# Patient Record
Sex: Female | Born: 1970
Health system: Southern US, Community
[De-identification: ages and names within clinical notes are randomized; demographics above are authoritative.]

## PROBLEM LIST (undated history)

## (undated) DIAGNOSIS — I10 Essential (primary) hypertension: Secondary | ICD-10-CM

## (undated) DIAGNOSIS — E119 Type 2 diabetes mellitus without complications: Secondary | ICD-10-CM

## (undated) HISTORY — PX: VOCAL CORD LATERALIZATION, ENDOSCOPIC APPROACH W/ MLB: SHX2664

## (undated) HISTORY — PX: TUBAL LIGATION: SHX77

---

## 1997-07-24 ENCOUNTER — Other Ambulatory Visit: Admission: RE | Admit: 1997-07-24 | Discharge: 1997-07-24 | Payer: Self-pay | Admitting: Obstetrics

## 1997-09-29 ENCOUNTER — Emergency Department (HOSPITAL_COMMUNITY): Admission: EM | Admit: 1997-09-29 | Discharge: 1997-09-29 | Payer: Self-pay | Admitting: Emergency Medicine

## 2000-09-26 ENCOUNTER — Encounter: Payer: Self-pay | Admitting: Emergency Medicine

## 2000-09-26 ENCOUNTER — Emergency Department (HOSPITAL_COMMUNITY): Admission: EM | Admit: 2000-09-26 | Discharge: 2000-09-26 | Payer: Self-pay

## 2005-02-23 ENCOUNTER — Emergency Department (HOSPITAL_COMMUNITY): Admission: EM | Admit: 2005-02-23 | Discharge: 2005-02-23 | Payer: Self-pay | Admitting: Family Medicine

## 2005-06-11 ENCOUNTER — Emergency Department (HOSPITAL_COMMUNITY): Admission: EM | Admit: 2005-06-11 | Discharge: 2005-06-11 | Payer: Self-pay | Admitting: Emergency Medicine

## 2005-06-11 IMAGING — DX DG ORTHOPANTOGRAM /PANORAMIC
1 series · 1 of 1 positions shown · non-contrast
Comparison: none

CLINICAL DATA: 35 year-old female with left upper posterior mandible pain and swelling.
 ORTHOPANTOGRAM:

[view not recorded]
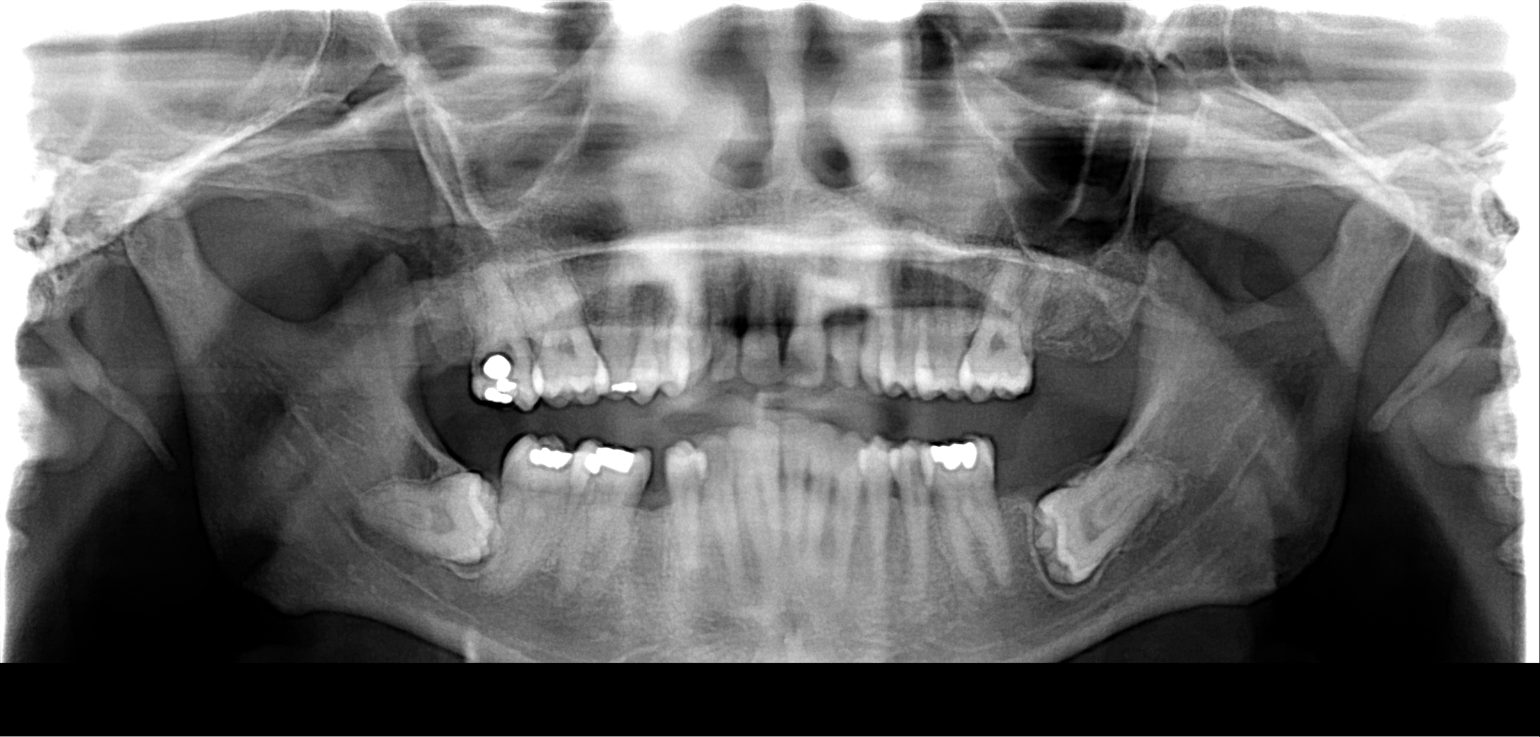

[1 of 1 positions shown; findings below may reference images not displayed]

FINDINGS: Intact mandible.  No displaced fracture.  Partially erupted residual wisdom teeth.  Dental hardware is evident.
IMPRESSION: 1. Intact mandible.
 2. Residual embedded wisdom teeth.

## 2009-10-10 ENCOUNTER — Emergency Department (HOSPITAL_COMMUNITY): Admission: EM | Admit: 2009-10-10 | Discharge: 2009-10-10 | Payer: Self-pay | Admitting: Family Medicine

## 2010-05-31 LAB — POCT URINALYSIS DIP (DEVICE)
Bilirubin Urine: NEGATIVE
Glucose, UA: >=1000 mg/dL — AB
Hgb urine dipstick: NEGATIVE
Ketones, ur: NEGATIVE mg/dL
Nitrite: POSITIVE — AB
Protein, ur: NEGATIVE mg/dL
Specific Gravity, Urine: 1.015 (ref 1.005–1.030)
Urobilinogen, UA: 0.2 mg/dL (ref 0.0–1.0)
pH: 5.5 (ref 5.0–8.0)

## 2010-05-31 LAB — URINE CULTURE
Colony Count: >=100000
Culture  Setup Time: 201107282029

## 2010-05-31 LAB — WET PREP, GENITAL
Clue Cells Wet Prep HPF POC: NONE SEEN
Yeast Wet Prep HPF POC: NONE SEEN

## 2010-05-31 LAB — GC/CHLAMYDIA PROBE AMP, GENITAL
Chlamydia, DNA Probe: NEGATIVE
GC Probe Amp, Genital: NEGATIVE

## 2013-10-02 ENCOUNTER — Encounter (HOSPITAL_BASED_OUTPATIENT_CLINIC_OR_DEPARTMENT_OTHER): Payer: Self-pay | Admitting: Emergency Medicine

## 2013-10-02 ENCOUNTER — Emergency Department (HOSPITAL_BASED_OUTPATIENT_CLINIC_OR_DEPARTMENT_OTHER)
Admission: EM | Admit: 2013-10-02 | Discharge: 2013-10-02 | Disposition: A | Discharge status: Home / Self Care | Payer: Worker's Compensation | Attending: Emergency Medicine | Admitting: Emergency Medicine

## 2013-10-02 DIAGNOSIS — Y9389 Activity, other specified: Secondary | ICD-10-CM | POA: Insufficient documentation

## 2013-10-02 DIAGNOSIS — Z792 Long term (current) use of antibiotics: Secondary | ICD-10-CM | POA: Insufficient documentation

## 2013-10-02 DIAGNOSIS — S60469A Insect bite (nonvenomous) of unspecified finger, initial encounter: Secondary | ICD-10-CM | POA: Insufficient documentation

## 2013-10-02 DIAGNOSIS — F17200 Nicotine dependence, unspecified, uncomplicated: Secondary | ICD-10-CM | POA: Insufficient documentation

## 2013-10-02 DIAGNOSIS — W57XXXA Bitten or stung by nonvenomous insect and other nonvenomous arthropods, initial encounter: Secondary | ICD-10-CM | POA: Insufficient documentation

## 2013-10-02 DIAGNOSIS — Y929 Unspecified place or not applicable: Secondary | ICD-10-CM | POA: Insufficient documentation

## 2013-10-02 MED ORDER — DOXYCYCLINE HYCLATE 100 MG PO CAPS: 100.0000 mg | ORAL_CAPSULE | Freq: Two times a day (BID) | ORAL | Status: DC

## 2013-10-02 NOTE — ED Notes (Signed)
Possible insect bite to her right 4th digit. Swelling, hot and painful to touch.

## 2013-10-02 NOTE — ED Provider Notes (Signed)
CSN: [id number]     Arrival date & time 10/02/13  1706 History  This chart was scribed for Threasa Beards, MD by Vernell Barrier, ED scribe. This patient was seen in room MH06/MH06 and the patient's care was started at 5:23 PM.     Chief Complaint  Patient presents with  . Insect Bite    Patient is a 43 y.o. female presenting with animal bite. The history is provided by the patient. No language interpreter was used.  Animal Bite Contact animal:  Insect Location:  Finger Finger injury location:  R ring finger Time since incident:  1 day Pain details:    Quality:  Localized   Severity:  Mild   Timing:  Constant   Progression:  Worsening Incident location:  Unable to specify Provoked: unprovoked   Notifications:  None Relieved by:  Nothing Worsened by:  Nothing tried Ineffective treatments:  None tried Associated symptoms: swelling   Associated symptoms: no fever    HPI Comments: Hannah Dougherty is a 43 y.o. female who presents to the Emergency Department complaining of localized pain and swelling to the right 4th finger; onset 1 day ago. States she woke up this morning and noticed it when her right pinky hit the area. Does not report seeing an insect bite her finger but believes that is the cause for current sxs.  History reviewed. No pertinent past medical history. History reviewed. No pertinent past surgical history. No family history on file. History  Substance Use Topics  . Smoking status: Current Every Day Smoker -- 0.50 packs/day    Types: Cigarettes  . Smokeless tobacco: Not on file  . Alcohol Use: Yes   OB History   Grav Para Term Preterm Abortions TAB SAB Ect Mult Living                 Review of Systems  Constitutional: Negative for fever.  All other systems reviewed and are negative.  Allergies  Review of patient's allergies indicates no known allergies.  Home Medications   Prior to Admission medications   Medication Sig Start Date End Date Taking?  Authorizing Provider  doxycycline (VIBRAMYCIN) 100 MG capsule Take 1 capsule (100 mg total) by mouth 2 (two) times daily. 10/02/13   Threasa Beards, MD   BP 175/104  Pulse 62  Temp(Src) 98.7 F (37.1 C) (Oral)  Resp 18  Ht 5\' 5"  (1.651 m)  Wt 217 lb (98.431 kg)  BMI 36.11 kg/m2  SpO2 100%  LMP 09/13/2013  Physical Exam  Nursing note and vitals reviewed. Constitutional: She is oriented to person, place, and time. She appears well-developed and well-nourished. No distress.  HENT:  Head: Normocephalic and atraumatic.  Eyes: Conjunctivae and EOM are normal.  Neck: Neck supple. No tracheal deviation present.  Cardiovascular: Normal rate.   Pulmonary/Chest: Effort normal. No respiratory distress.  Musculoskeletal: Normal range of motion.  Neurological: She is alert and oriented to person, place, and time.  Skin: Skin is warm and dry.  Erythema at medial right ring finger between the PIP and DIP joints with some swelling. No fluctuance. No streaks of erythema. Normal capillary refill distally. Sensation intact.   Psychiatric: She has a normal mood and affect. Her behavior is normal.    ED Course  Procedures (including critical care time) DIAGNOSTIC STUDIES: Oxygen Saturation is 100% on room air, normal by my interpretation.    COORDINATION OF CARE: At 5:24 PM: Discussed treatment plan with patient which includes prescription for antibiotics. Encouraged  to use an ice pack for swelling and use of hydrocortisone cream. Patient agrees.    Labs Review Labs Reviewed - No data to display  Imaging Review No results found.   EKG Interpretation None      MDM   Final diagnoses:  Insect bite   Pt presenting with area of pain and swelling on right 4th finger.  Most likely c/w insect bite.  Will cover with doxycycline to cover a mild developing cellulitis.  Pt also advised to use topical hydrocortisone cream for itching and swelling.  Discharged with strict return precautions.  Pt  agreeable with plan.   I personally performed the services described in this documentation, which was scribed in my presence. The recorded information has been reviewed and is accurate.     Threasa Beards, MD 10/02/13 [phone]

## 2013-10-02 NOTE — Discharge Instructions (Signed)
Return to the ED with any concerns including fever/chills, increased area of redness or swelling, redness streaking up the finger or hand, or any other alarming symptoms

## 2013-10-02 NOTE — ED Notes (Signed)
Pt states her BP is always high when she goes to the MD.  Pt also relates she has high reading when she gets it done at pharmacy.  Urged pt to follow up with her primary physician and get treatment if needed.  Pt related verbal understanding.

## 2014-11-30 ENCOUNTER — Emergency Department (HOSPITAL_COMMUNITY): Payer: Self-pay

## 2014-11-30 ENCOUNTER — Encounter (HOSPITAL_COMMUNITY): Payer: Self-pay | Admitting: Emergency Medicine

## 2014-11-30 ENCOUNTER — Emergency Department (HOSPITAL_COMMUNITY)
Admission: EM | Admit: 2014-11-30 | Discharge: 2014-11-30 | Disposition: A | Discharge status: Home / Self Care | Payer: Self-pay | Attending: Emergency Medicine | Admitting: Emergency Medicine

## 2014-11-30 DIAGNOSIS — R0602 Shortness of breath: Secondary | ICD-10-CM | POA: Insufficient documentation

## 2014-11-30 DIAGNOSIS — R05 Cough: Secondary | ICD-10-CM | POA: Insufficient documentation

## 2014-11-30 DIAGNOSIS — Z79899 Other long term (current) drug therapy: Secondary | ICD-10-CM | POA: Insufficient documentation

## 2014-11-30 DIAGNOSIS — Z720 Tobacco use: Secondary | ICD-10-CM | POA: Insufficient documentation

## 2014-11-30 LAB — CBC WITH DIFFERENTIAL/PLATELET
Basophils Absolute: 0 10*3/uL (ref 0.0–0.1)
Basophils Relative: 0 %
Eosinophils Absolute: 0.2 10*3/uL (ref 0.0–0.7)
Eosinophils Relative: 3 %
HCT: 37.9 % (ref 36.0–46.0)
Hemoglobin: 12.5 g/dL (ref 12.0–15.0)
Lymphocytes Relative: 36 %
Lymphs Abs: 2.6 10*3/uL (ref 0.7–4.0)
MCH: 28 pg (ref 26.0–34.0)
MCHC: 33 g/dL (ref 30.0–36.0)
MCV: 84.8 fL (ref 78.0–100.0)
Monocytes Absolute: 0.5 10*3/uL (ref 0.1–1.0)
Monocytes Relative: 7 %
Neutro Abs: 3.9 10*3/uL (ref 1.7–7.7)
Neutrophils Relative %: 54 %
Platelets: 201 10*3/uL (ref 150–400)
RBC: 4.47 MIL/uL (ref 3.87–5.11)
RDW: 14.2 % (ref 11.5–15.5)
WBC: 7.3 10*3/uL (ref 4.0–10.5)

## 2014-11-30 LAB — I-STAT CHEM 8, ED
BUN: 18 mg/dL (ref 6–20)
Calcium, Ion: 1.18 mmol/L (ref 1.12–1.23)
Chloride: 104 mmol/L (ref 101–111)
Creatinine, Ser: 1 mg/dL (ref 0.44–1.00)
Glucose, Bld: 148 mg/dL — ABNORMAL HIGH (ref 65–99)
HCT: 42 % (ref 36.0–46.0)
Hemoglobin: 14.3 g/dL (ref 12.0–15.0)
Potassium: 4.3 mmol/L (ref 3.5–5.1)
Sodium: 139 mmol/L (ref 135–145)
TCO2: 23 mmol/L (ref 0–100)

## 2014-11-30 LAB — BRAIN NATRIURETIC PEPTIDE: B Natriuretic Peptide: 36.2 pg/mL (ref 0.0–100.0)

## 2014-11-30 LAB — I-STAT TROPONIN, ED
Troponin i, poc: 0.01 ng/mL (ref 0.00–0.08)
Troponin i, poc: 0 ng/mL (ref 0.00–0.08)

## 2014-11-30 LAB — D-DIMER, QUANTITATIVE (NOT AT ARMC): D-Dimer, Quant: 0.28 ug/mL-FEU (ref 0.00–0.48)

## 2014-11-30 IMAGING — DX DG CHEST 2V
2 series · 2 of 2 positions shown · non-contrast
Comparison: None.

CLINICAL DATA: Acute chest pain and shortness of breath.

EXAM:
CHEST  2 VIEW

[chest pa]
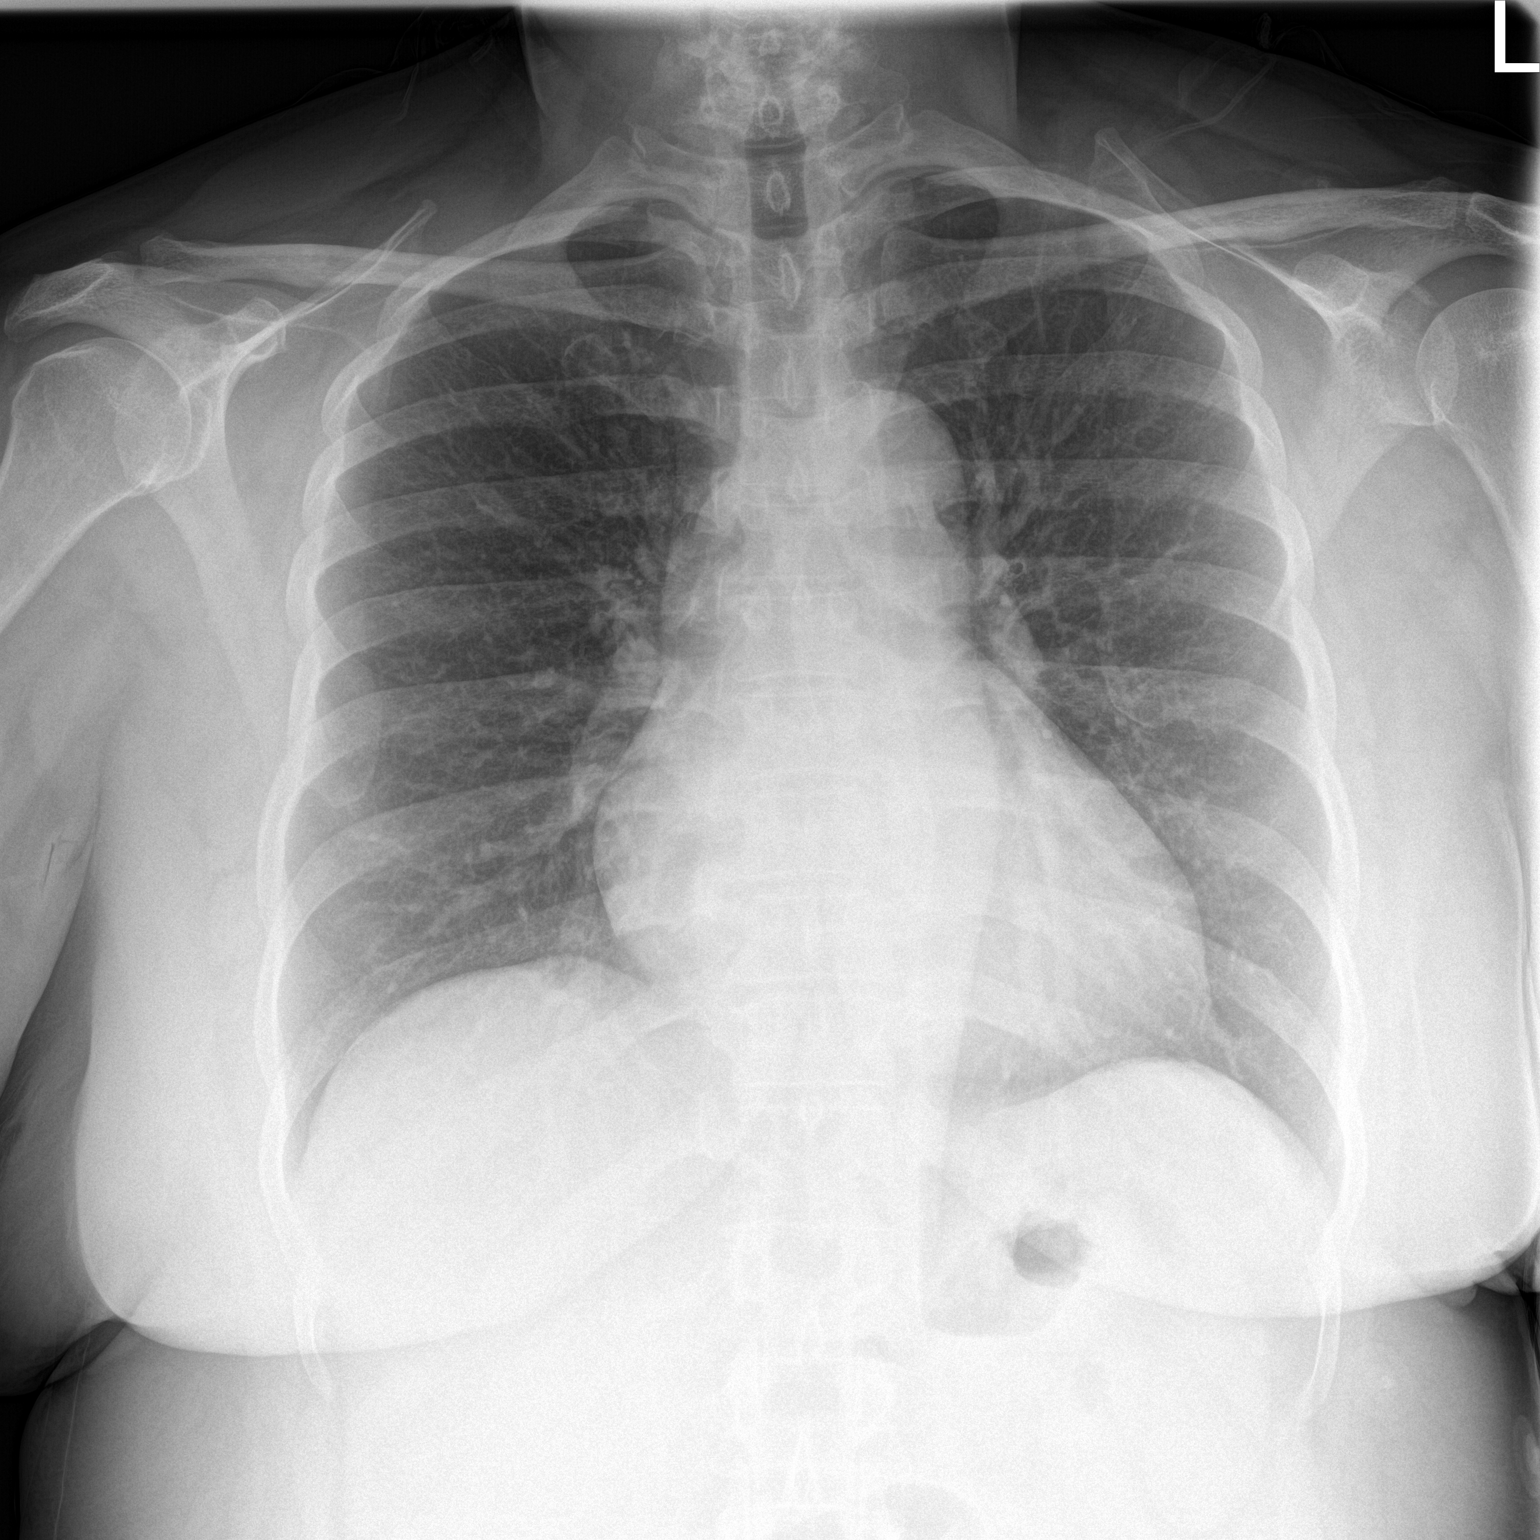

[chest lat]
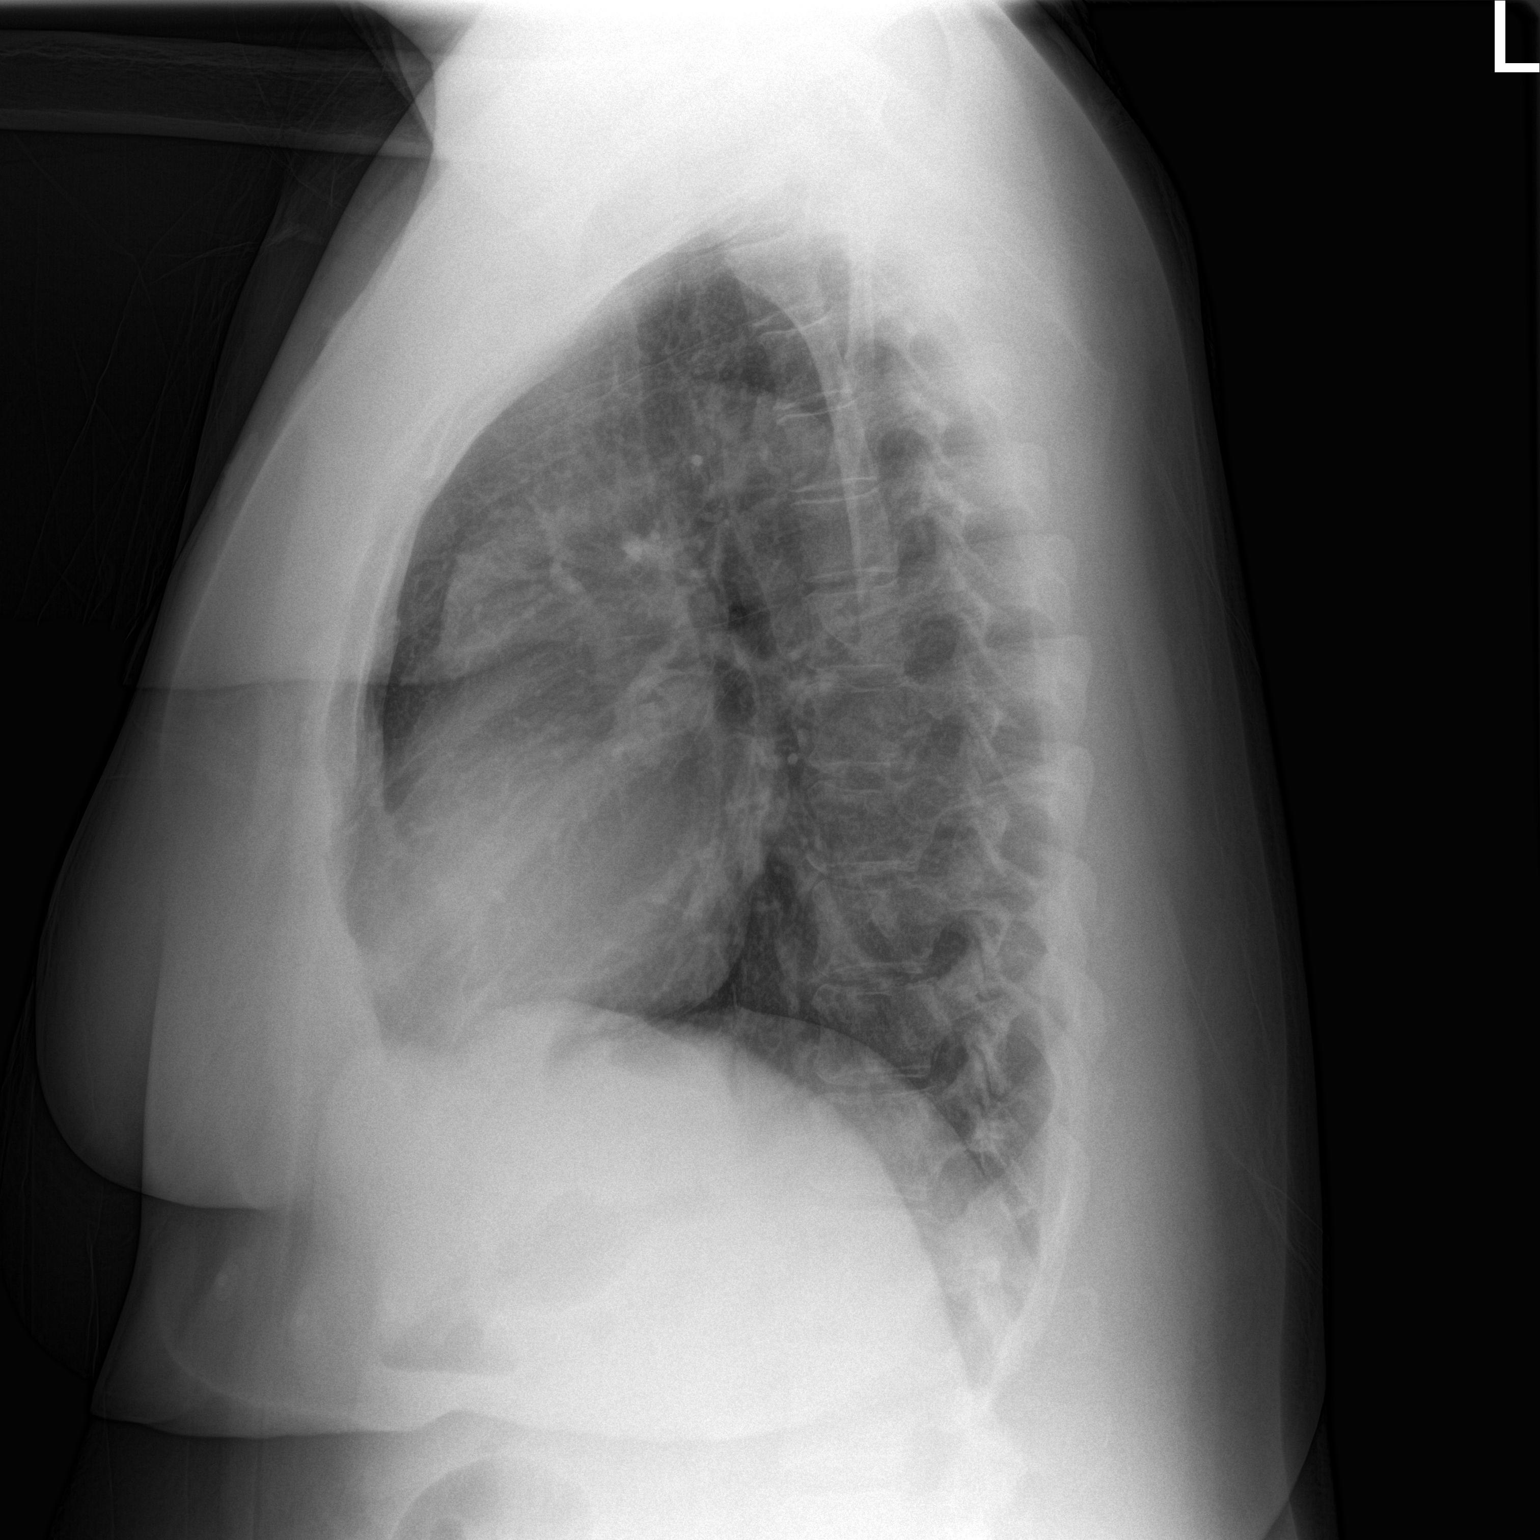

[2 of 2 positions shown; findings below may reference images not displayed]

FINDINGS: The heart size and mediastinal contours are within normal limits.
Both lungs are clear. No pneumothorax or pleural effusion is noted.
The visualized skeletal structures are unremarkable.
IMPRESSION: No active cardiopulmonary disease.

## 2014-11-30 MED ORDER — ALBUTEROL SULFATE HFA 108 (90 BASE) MCG/ACT IN AERS: 2.0000 | INHALATION_SPRAY | Freq: Four times a day (QID) | RESPIRATORY_TRACT | Status: DC | PRN

## 2014-11-30 MED ORDER — HYDROCODONE-ACETAMINOPHEN 5-325 MG PO TABS
1.0000 | ORAL_TABLET | Freq: Once | ORAL | Status: AC
  Administered 2014-11-30: 1 via ORAL
  Filled 2014-11-30: qty 1

## 2014-11-30 MED ORDER — LISINOPRIL 10 MG PO TABS
5.0000 mg | ORAL_TABLET | Freq: Once | ORAL | Status: AC
  Administered 2014-11-30: 5 mg via ORAL
  Filled 2014-11-30: qty 1

## 2014-11-30 MED ORDER — ALBUTEROL SULFATE (2.5 MG/3ML) 0.083% IN NEBU
5.0000 mg | INHALATION_SOLUTION | Freq: Once | RESPIRATORY_TRACT | Status: AC
  Administered 2014-11-30: 5 mg via RESPIRATORY_TRACT
  Filled 2014-11-30: qty 6

## 2014-11-30 MED ORDER — CYCLOBENZAPRINE HCL 5 MG PO TABS: 5.0000 mg | ORAL_TABLET | Freq: Three times a day (TID) | ORAL | Status: DC | PRN

## 2014-11-30 NOTE — ED Notes (Signed)
Patient transported to X-ray 

## 2014-11-30 NOTE — ED Notes (Signed)
Pt walked to bathroom and back to room in nad, pt oxygen saturation maintained at 100%. Denies any dizziness or other complaints. MD notified and informed.

## 2014-11-30 NOTE — Discharge Instructions (Signed)
Workup done in the ED was unremarkable for any emergent causes of your chest pain or shortness of breath Since you had some relief with albuterol I will send you home with albuterol prescription to use as needed for shortness of breath You're encouraged to stop smoking to help with her shortness of breath It is important that you follow-up with a primary care doctor who can manage your illnesses. Her blood pressure was elevated in the ED. You need to be started on antihypertensive medications See below resource guide for clinics. I would recommend trying the [place] first for primary care Please see below for return precautions.   Shortness of Breath Shortness of breath means you have trouble breathing. Shortness of breath needs medical care right away. HOME CARE   Do not smoke.  Avoid being around chemicals or things (paint fumes, dust) that may bother your breathing.  Rest as needed. Slowly begin your normal activities.  Only take medicines as told by your doctor.  Keep all doctor visits as told. GET HELP RIGHT AWAY IF:   Your shortness of breath gets worse.  You feel lightheaded, pass out (faint), or have a cough that is not helped by medicine.  You cough up blood.  You have pain with breathing.  You have pain in your chest, arms, shoulders, or belly (abdomen).  You have a fever.  You cannot walk up stairs or exercise the way you normally do.  You do not get better in the time expected.  You have a hard time doing normal activities even with rest.  You have problems with your medicines.  You have any new symptoms. MAKE SURE YOU:  Understand these instructions.  Will watch your condition.  Will get help right away if you are not doing well or get worse. Document Released: 08/19/2007 Document Revised: 03/07/2013 Document Reviewed: 05/18/2011 [facility] Patient Information 2015 [place], [place]. This information is not intended to replace advice  given to you by your health care provider. Make sure you discuss any questions you have with your health care provider.    Emergency Department Resource Guide 1) Find a Doctor and Pay Out of Pocket Although you won't have to find out who is covered by your insurance plan, it is a good idea to ask around and get recommendations. You will then need to call the office and see if the doctor you have chosen will accept you as a new patient and what types of options they offer for patients who are self-pay. Some doctors offer discounts or will set up payment plans for their patients who do not have insurance, but you will need to ask so you aren't surprised when you get to your appointment.  2) Contact Your Local Health Department Not all health departments have doctors that can see patients for sick visits, but many do, so it is worth a call to see if yours does. If you don't know where your local health department is, you can check in your phone book. The CDC also has a tool to help you locate your state's health department, and many state websites also have listings of all of their local health departments.  3) Find a [facility] If your illness is not likely to be very severe or complicated, you may want to try a walk in clinic. These are popping up all over the country in pharmacies, drugstores, and shopping centers. They're usually staffed by nurse practitioners or physician assistants that have been  trained to treat common illnesses and complaints. They're usually fairly quick and inexpensive. However, if you have serious medical issues or chronic medical problems, these are probably not your best option.  No Primary Care Doctor: - Call Health Connect at  [phone] - they can help you locate a primary care doctor that  accepts your insurance, provides certain services, etc. - Physician Referral Service- [phone]  Chronic Pain Problems: Organization         Address  Phone   Notes  [facility]  [phone] Patients need to be referred by their primary care doctor.   Medication Assistance: Organization         Address  Phone   Notes  [facility] Medication [facility] [address] [place], [place] [address] [phone] --Must be a resident of [facility] -- Must have NO insurance coverage whatsoever (no Medicaid/ Medicare, etc.) -- The pt. MUST have a primary care doctor that directs their care regularly and follows them in the community   MedAssist  [phone]   [employer]  [phone]    Agencies that provide inexpensive medical care: Organization         Address  Phone   Notes  [place]  [phone]   [facility]    [phone]   [facility] [place], [place] [address] [phone]   [place] [address], [place] [phone]   Planned Parenthood    [phone]   [facility]    [phone]   [place] and [address], [place] Phone:  [phone], Fax:  [phone] Hours of Operation:  9 am - 6 pm, M-F.  Also accepts Medicaid/Medicare and self-pay.  [facility] for [place], [address], [place] Phone: [phone], Fax: [phone]. Hours of Operation:  8:30 am - 5:30 pm, M-F.  Also accepts Medicaid and self-pay.  [facility] [place] [address] [place] Phone: [phone]   [place], [place], [place] [phone], Ext. [phone] Mondays & Thursdays: 7-9 AM.  First 15 patients are seen on a first come, first serve basis.    [place] Providers:  Organization         Address  Phone   Notes  [facility] [address],  [phone] Also accepts self-pay patients.  [facility] [address], [place]  [phone]   [place], [address], [place] [phone]   [facility] [address] [place] [phone]   Lucianne Lei [address], [place]   [phone] Only accepts [place] Access [place] patients after they have their name applied to their card.   Self-Pay (no insurance) in [facility]:  Organization         Address  Phone   Notes  Sickle Cell Patients, [facility] [place] [phone]   [facility] Urgent Care [place] [phone]   [facility]gent [place]  [place], [address], [place] [phone]   [facility]/Dr. Osei-Bonsu  [address] [place] or [place]  [address], [address], [place] [phone] Phone number for both [facility] and [place] locations is the same.  Urgent Medical and [facility] [address] [place] [phone]   [facility] [address] [place] or [address] [phone]   [facility] [address], [place] [phone], phone; [phone], fax Sees patients 1st and 3rd Saturday of every month.  Must not qualify for public or private insurance (i.e. Medicaid, Medicare, [place] Health Choice, Veterans' Benefits)  Household income should be no more than 200% of the poverty level The clinic cannot treat you if you are pregnant or think you are pregnant  Sexually transmitted diseases are not treated at the clinic.    Dental Care: Organization         Address  Phone  Notes  [facility] Department of [facility] [place] [phone] Accepts children up to age 89 who are enrolled in [place] or [place]; pregnant women with a Medicaid card; and children who have applied for  Medicaid or [place] Health Choice, but were declined, whose parents can pay a reduced fee at time of service.  [facility] Department of [facility]  [address], [place] [phone] Accepts children up to age 47 who are enrolled in [place] or [place]; pregnant women with a Medicaid card; and children who have applied for Medicaid or [place] Health Choice, but were declined, whose parents can pay a reduced fee at time of service.  [place] Adult Dental Access PROGRAM  [place] [phone] Patients are seen by appointment only. Walk-ins are not accepted. [place] will see patients 56 years of age and older. Monday - Tuesday (8am-5pm) Most Wednesdays (8:30-5pm) $30 per visit, cash only  [facility] Adult Dental Access PROGRAM  [address], [facility] [phone] Patients are seen by appointment only. Walk-ins are not accepted. [place] will see patients 59 years of age and older. One Wednesday Evening (Monthly: Volunteer Based).  $30 per visit, cash only  [place]  [phone] for adults; Children under age 30, call Graduate Pediatric Dentistry at [phone]. Children aged 49-14, please call [phone] to request a pediatric application.  Dental services are provided in all areas of dental care including fillings, crowns and bridges, complete and partial dentures, implants, gum treatment, root canals, and extractions. Preventive care is also provided. Treatment is provided to both adults and children. Patients are selected via a lottery and there is often a waiting list.   [facility] [address] [place]  [phone] [email or web address]   [facility] [address] [place], [place] [phone], Ext. [phone] Second and Fourth Thursday of each month, opens at 6:30 AM; Clinic ends at 9 AM.  Patients are seen on a first-come first-served basis, and a limited number  are seen during each clinic.   [facility], [place]  [address] Hillard Danker [place], [place] [phone]   Eligibility Requirements You must have lived in [place], [place], or [place] counties for at least the last three months.   You cannot be eligible for state or federal sponsored [employer], including [employer], [place], or [employer].   You generally cannot be eligible for healthcare insurance through your employer.    How to apply: Eligibility screenings are  held every Tuesday and Wednesday afternoon from 1:00 pm until 4:00 pm. You do not need an appointment for the interview!  [facility] [address], [place], [place]   [place]  [place] Department  [place]  [phone]    Behavioral Health Resources in the Community: Intensive Outpatient Programs Organization         Address  Phone  Notes  [place]. [address], [place], [place] [phone]   [facility]patient [address] [place], [place]   ADS: Alcohol & Drug Svcs [address], [place], [place]   [place] [address]  [place], [place] or [phone]   Substance Abuse Resources Organization         Address  Phone  Notes  Alcohol and Drug Services  [phone]   [place]  [phone]   [facility] [place]   Chinita Pester  [phone]   Residential & Outpatient Substance Abuse Program  [phone]   Psychological Services Organization         Address  Phone  Notes  [facility] [place]  [place]  [phone]   [place] [address], [place] or [phone]    Mobile Crisis Teams Organization         Address  Phone  Notes  Therapeutic  Alternatives, Mobile Crisis Care Unit  [phone]   Assertive Psychotherapeutic Services  [address] [place], [place]   [place] [address], [place] [phone]    Self-Help/Support Groups Organization         Address  Phone             Notes  [place]. of [place] - variety of support groups  Shannon Call for more information  Narcotics Anonymous (NA), [facility] [address], [employer] [place]  2 meetings at this location   Special educational needs teacher         Address  Phone  Notes  ASAP Residential Treatment [place],    [employer]  [phone]   [facility]  [address] [place] [address], [place], [place]   [place], [place] [phone] Admissions: 8am-3pm M-F  Incentives Substance [place] [address]    [place], [place] [phone]   [facility] [address] [place], [place], [place]   [facility] [address]  [place] [address], [place]   [facility]ight Programs - Intensive Outpatient [address], [place], [place]   [facility] ([place] [address]  [place], [place] [phone] or [phone]   Residential Treatment Services (RTS) [address] [place], [place] Accepts Medicaid  [facility] [place] [address]  [place] [phone] Substance Abuse/Addiction Treatment   [facility] Organization         Address  Phone  Notes  [facility]  [phone]   Domenic Schwab, PhD [address] Arlis Porta [place], [place]   [phone] or [phone]   [place]   [address] [place], [place] [phone]   [place] [address], [place], [place] [phone] Insurance/Medicaid/sponsorship through  [facility]  Faith and Families  [address], Ste [place], [place] [phone] [place], [place] [phone]    Dr. Adele Schilder  [phone]   [facility] Dept. 1) [address], [place] 2) [place] 3)  [place] [address], [place] [phone]  [phone]   [place] [phone] or [phone] (After Hours)

## 2014-11-30 NOTE — ED Provider Notes (Signed)
CSN: [id number]     Arrival date & time 11/30/14  0906 History   First MD Initiated Contact with Patient 11/30/14 [phone]     No chief complaint on file.   HPI Hannah Dougherty is a 44 y.o. female presenting to the ED for a days worth of dyspnea. Patient states that for the last 3 days she has been unable to take good deep breaths. She says that she has back pain located between her shoulder blades. She states that it hurts when she moves a certain way and takes deep breaths. She denies any chest pain or tightness. She denies any radiating symptoms. She denies fever or sick contacts. She does have a history of smoking and smokes about 7 cigarettes a day stating that a pack lasts for about 3 days. She does have sputum production with her coughing states that there has not been any color changed her sputum is clear.   History reviewed. No pertinent past medical history. Past Surgical History  Procedure Laterality Date  . Tubal ligation    . Vocal cord lateralization, endoscopic approach w/ mlb     History reviewed. No pertinent family history. Social History  Substance Use Topics  . Smoking status: Current Every Day Smoker -- 1.50 packs/day    Types: Cigarettes  . Smokeless tobacco: Never Used  . Alcohol Use: Yes   OB History    No data available     Review of Systems  Constitutional: Negative for fever and chills.  Respiratory: Positive for cough and shortness of breath. Negative for wheezing.   Cardiovascular: Negative for chest pain.  Gastrointestinal: Negative for nausea, vomiting and abdominal pain.  Also per HPI   Allergies  Review of patient's allergies indicates no known allergies.  Home Medications   Prior to Admission medications   Medication Sig Start Date End Date Taking? Authorizing Provider  albuterol (PROVENTIL HFA;VENTOLIN HFA) 108 (90 BASE) MCG/ACT inhaler Inhale 2 puffs into the lungs every 6 (six) hours as needed for wheezing or shortness of breath. 11/30/14   Katheren Shams, DO  cyclobenzaprine (FLEXERIL) 5 MG tablet Take 1 tablet (5 mg total) by mouth 3 (three) times daily as needed for muscle spasms. 11/30/14   Katheren Shams, DO  doxycycline (VIBRAMYCIN) 100 MG capsule Take 1 capsule (100 mg total) by mouth 2 (two) times daily. Patient not taking: Reported on 11/30/2014 10/02/13   Alfonzo Beers, MD   BP 179/78 mmHg  Pulse 58  Temp(Src) 97.9 F (36.6 C) (Oral)  Resp 20  Ht 5\' 5"  (1.651 m)  Wt 223 lb (101.152 kg)  BMI 37.11 kg/m2  SpO2 100% Physical Exam  Constitutional: She appears well-developed and well-nourished. No distress.  HENT:  Head: Normocephalic and atraumatic.  Mouth/Throat: Oropharynx is clear and moist.  Eyes: Conjunctivae and EOM are normal.  Cardiovascular: Normal rate, regular rhythm, normal heart sounds and intact distal pulses.   Pulmonary/Chest: Effort normal. She has decreased breath sounds in the right lower field and the left lower field. She has no wheezes. She has no rales. She exhibits no tenderness.  Abdominal: Soft. Bowel sounds are normal. There is no tenderness.  Musculoskeletal: She exhibits no edema.  Neurological: She is alert.  Skin: Skin is warm and dry.  Psychiatric: She has a normal mood and affect.   ED Course  Procedures (including critical care time) Labs Review Labs Reviewed  I-STAT CHEM 8, ED - Abnormal; Notable for the following:    Glucose, Bld 148 (*)  All other components within normal limits  CBC WITH DIFFERENTIAL/PLATELET  D-DIMER, QUANTITATIVE (NOT AT [facility])  BRAIN NATRIURETIC PEPTIDE  I-STAT TROPOININ, ED  Randolm Idol, ED    Imaging Review Dg Chest 2 View  11/30/2014   CLINICAL DATA:  Acute chest pain and shortness of breath.  EXAM: CHEST  2 VIEW  COMPARISON:  None.  FINDINGS: The heart size and mediastinal contours are within normal limits. Both lungs are clear. No pneumothorax or pleural effusion is noted. The visualized skeletal structures are unremarkable.  IMPRESSION: No  active cardiopulmonary disease.   Electronically Signed   By: Marijo Conception, M.D.   On: 11/30/2014 10:16   I have personally reviewed and evaluated these images and lab results as part of my medical decision-making.   EKG Interpretation   Date/Time:  Friday November 30 2014 09:51:50 EDT Ventricular Rate:  57 PR Interval:  145 QRS Duration: 98 QT Interval:  436 QTC Calculation: 424 R Axis:   54 Text Interpretation:  Sinus rhythm RSR' in V1 or V2, probably normal  variant ST elev, probable normal early repol pattern Confirmed by LITTLE  MD, RACHEL ([id number]) on 11/30/2014 10:19:02 AM     MDM   Final diagnoses:  Shortness of breath   Patient presented to the ED with dyspnea. Unknown etiology for dyspnea.  Work-up in ED was unremarkable. D-dimer was negative,  BNP normal, troponin x2 negative. CXR unremarkable for any active disease. She ambualted with pulse ox and O2 saturations remained 100%.  Patient given albuteral nebulizer with better air movement. No wheezing appreciated before or after treatment. Will discharge with albuterol inhaler for prn use. Did not treat for COPD exacerbation do to patient being asymptomatic and history not convincing for exacerbation. Also with hyperglycemia will avoid giving steriods to worsen. No benefit seen.   Elevated BPs appreciated in ED. From reviewing chart this is a chronic issue for patient. She does not follow-up with a PCP and never been on antihypertensives. One dose lisinopril given here. Not comfortable discharging home with antihypertensives if patient does not have follow-up and cannot monitor side effects.  No signs of hypertensive emergency on labs and patient asymptomatic.  After history, exam, and medical workup I feel the patient has been appropriately medically screened and is safe for discharge home. Pertinent diagnoses were discussed with the patient. Patient was given return precautions. PCP resource guide given.   Luiz Blare,  DO 11/30/2014, 1:47 PM PGY-2, [place], DO 11/30/14 [place], MD 11/30/14 1710

## 2014-11-30 NOTE — ED Notes (Signed)
EDP at bedside  

## 2014-11-30 NOTE — ED Notes (Addendum)
Pt comes in with c/o mid upper back pain for the past few days. Pt denies chest pain, dizziness, or N/V. Pt A&Ox4, NAD noted.

## 2014-12-09 ENCOUNTER — Emergency Department (HOSPITAL_COMMUNITY)
Admission: EM | Admit: 2014-12-09 | Discharge: 2014-12-09 | Disposition: A | Discharge status: Home / Self Care | Payer: No Typology Code available for payment source | Attending: Emergency Medicine | Admitting: Emergency Medicine

## 2014-12-09 ENCOUNTER — Encounter (HOSPITAL_COMMUNITY): Payer: Self-pay | Admitting: Emergency Medicine

## 2014-12-09 DIAGNOSIS — Y92410 Unspecified street and highway as the place of occurrence of the external cause: Secondary | ICD-10-CM | POA: Diagnosis not present

## 2014-12-09 DIAGNOSIS — Y9389 Activity, other specified: Secondary | ICD-10-CM | POA: Insufficient documentation

## 2014-12-09 DIAGNOSIS — Y998 Other external cause status: Secondary | ICD-10-CM | POA: Diagnosis not present

## 2014-12-09 DIAGNOSIS — M7918 Myalgia, other site: Secondary | ICD-10-CM

## 2014-12-09 DIAGNOSIS — S199XXA Unspecified injury of neck, initial encounter: Secondary | ICD-10-CM | POA: Insufficient documentation

## 2014-12-09 DIAGNOSIS — S24109A Unspecified injury at unspecified level of thoracic spinal cord, initial encounter: Secondary | ICD-10-CM | POA: Diagnosis not present

## 2014-12-09 DIAGNOSIS — Z720 Tobacco use: Secondary | ICD-10-CM | POA: Diagnosis not present

## 2014-12-09 DIAGNOSIS — Z79899 Other long term (current) drug therapy: Secondary | ICD-10-CM | POA: Insufficient documentation

## 2014-12-09 MED ORDER — NAPROXEN 500 MG PO TABS: 500.0000 mg | ORAL_TABLET | Freq: Two times a day (BID) | ORAL | Status: DC

## 2014-12-09 MED ORDER — HYDROCODONE-ACETAMINOPHEN 5-325 MG PO TABS
2.0000 | ORAL_TABLET | Freq: Once | ORAL | Status: AC
  Administered 2014-12-09: 2 via ORAL
  Filled 2014-12-09: qty 2

## 2014-12-09 MED ORDER — CYCLOBENZAPRINE HCL 10 MG PO TABS
5.0000 mg | ORAL_TABLET | Freq: Once | ORAL | Status: AC
  Administered 2014-12-09: 5 mg via ORAL
  Filled 2014-12-09: qty 1

## 2014-12-09 MED ORDER — METHOCARBAMOL 500 MG PO TABS: 500.0000 mg | ORAL_TABLET | Freq: Two times a day (BID) | ORAL | Status: DC
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
## 2014-12-09 NOTE — ED Notes (Signed)
Declined W/C at D/C and was escorted to lobby by RN. 

## 2014-12-09 NOTE — Discharge Instructions (Signed)
Musculoskeletal Pain Follow up with a primary care provider using the resource guide below. Return for any bowel or bladder incontinence or retention or any weakness in her lower extremities. Take naproxen for pain. Do not operate heavy machinery or drive while using muscle relaxers. Musculoskeletal pain is muscle and boney aches and pains. These pains can occur in any part of the body. Your caregiver may treat you without knowing the cause of the pain. They may treat you if blood or urine tests, X-rays, and other tests were normal.  CAUSES There is often not a definite cause or reason for these pains. These pains may be caused by a type of germ (virus). The discomfort may also come from overuse. Overuse includes working out too hard when your body is not fit. Boney aches also come from weather changes. Bone is sensitive to atmospheric pressure changes. HOME CARE INSTRUCTIONS   Ask when your test results will be ready. Make sure you get your test results.  Only take over-the-counter or prescription medicines for pain, discomfort, or fever as directed by your caregiver. If you were given medications for your condition, do not drive, operate machinery or power tools, or sign legal documents for 24 hours. Do not drink alcohol. Do not take sleeping pills or other medications that may interfere with treatment.  Continue all activities unless the activities cause more pain. When the pain lessens, slowly resume normal activities. Gradually increase the intensity and duration of the activities or exercise.  During periods of severe pain, bed rest may be helpful. Lay or sit in any position that is comfortable.  Putting ice on the injured area.  Put ice in a bag.  Place a towel between your skin and the bag.  Leave the ice on for 15 to 20 minutes, 3 to 4 times a day.  Follow up with your caregiver for continued problems and no reason can be found for the pain. If the pain becomes worse or does not go  away, it may be necessary to repeat tests or do additional testing. Your caregiver may need to look further for a possible cause. SEEK IMMEDIATE MEDICAL CARE IF:  You have pain that is getting worse and is not relieved by medications.  You develop chest pain that is associated with shortness or breath, sweating, feeling sick to your stomach (nauseous), or throw up (vomit).  Your pain becomes localized to the abdomen.  You develop any new symptoms that seem different or that concern you. MAKE SURE YOU:   Understand these instructions.  Will watch your condition.  Will get help right away if you are not doing well or get worse. Document Released: 03/02/2005 Document Revised: 05/25/2011 Document Reviewed: 11/04/2012 [facility] Patient Information 2015 [place], [place]. This information is not intended to replace advice given to you by your health care provider. Make sure you discuss any questions you have with your health care provider.  Motor Vehicle Collision It is common to have multiple bruises and sore muscles after a motor vehicle collision (MVC). These tend to feel worse for the first 24 hours. You may have the most stiffness and soreness over the first several hours. You may also feel worse when you wake up the first morning after your collision. After this point, you will usually begin to improve with each day. The speed of improvement often depends on the severity of the collision, the number of injuries, and the location and nature of these injuries. HOME CARE INSTRUCTIONS  Put ice  on the injured area.  Put ice in a plastic bag.  Place a towel between your skin and the bag.  Leave the ice on for 15-20 minutes, 3-4 times a day, or as directed by your health care provider.  Drink enough fluids to keep your urine clear or pale yellow. Do not drink alcohol.  Take a warm shower or bath once or twice a day. This will increase blood flow to sore muscles.  You may return to  activities as directed by your caregiver. Be careful when lifting, as this may aggravate neck or back pain.  Only take over-the-counter or prescription medicines for pain, discomfort, or fever as directed by your caregiver. Do not use aspirin. This may increase bruising and bleeding. SEEK IMMEDIATE MEDICAL CARE IF:  You have numbness, tingling, or weakness in the arms or legs.  You develop severe headaches not relieved with medicine.  You have severe neck pain, especially tenderness in the middle of the back of your neck.  You have changes in bowel or bladder control.  There is increasing pain in any area of the body.  You have shortness of breath, light-headedness, dizziness, or fainting.  You have chest pain.  You feel sick to your stomach (nauseous), throw up (vomit), or sweat.  You have increasing abdominal discomfort.  There is blood in your urine, stool, or vomit.  You have pain in your shoulder (shoulder strap areas).  You feel your symptoms are getting worse. MAKE SURE YOU:  Understand these instructions.  Will watch your condition.  Will get help right away if you are not doing well or get worse. Document Released: 03/02/2005 Document Revised: 07/17/2013 Document Reviewed: 07/30/2010 [facility] Patient Information 2015 [place], [place]. This information is not intended to replace advice given to you by your health care provider. Make sure you discuss any questions you have with your health care provider.  Emergency Department Resource Guide 1) Find a Doctor and Pay Out of Pocket Although you won't have to find out who is covered by your insurance plan, it is a good idea to ask around and get recommendations. You will then need to call the office and see if the doctor you have chosen will accept you as a new patient and what types of options they offer for patients who are self-pay. Some doctors offer discounts or will set up payment plans for their patients who do not  have insurance, but you will need to ask so you aren't surprised when you get to your appointment.  2) Contact Your Local Health Department Not all health departments have doctors that can see patients for sick visits, but many do, so it is worth a call to see if yours does. If you don't know where your local health department is, you can check in your phone book. The CDC also has a tool to help you locate your state's health department, and many state websites also have listings of all of their local health departments.  3) Find a [facility] If your illness is not likely to be very severe or complicated, you may want to try a walk in clinic. These are popping up all over the country in pharmacies, drugstores, and shopping centers. They're usually staffed by nurse practitioners or physician assistants that have been trained to treat common illnesses and complaints. They're usually fairly quick and inexpensive. However, if you have serious medical issues or chronic medical problems, these are probably not your best option.  No Primary Care  Doctor: - Call Health Connect at  [phone] - they can help you locate a primary care doctor that  accepts your insurance, provides certain services, etc. - Physician Referral Service- [phone]  Chronic Pain Problems: Organization         Address  Phone   Notes  [facility]  [phone] Patients need to be referred by their primary care doctor.   Medication Assistance: Organization         Address  Phone   Notes  [facility] Medication [facility] [address] [place], [place] [address] [phone] --Must be a resident of [facility] -- Must have NO insurance coverage whatsoever (no Medicaid/ Medicare, etc.) -- The pt. MUST have a primary care doctor that directs their care regularly and follows them in the community   MedAssist  [phone]   [employer]  [phone]    Agencies that  provide inexpensive medical care: Organization         Address  Phone   Notes  [place]  [phone]   [facility]    [phone]   [facility] [place], [place] [address] [phone]   [place] [address], [place] [phone]   Planned Parenthood    [phone]   [facility]    [phone]   [place] and [place] [address], [place] Phone:  [phone], Fax:  [phone] Hours of Operation:  9 am - 6 pm, M-F.  Also accepts Medicaid/Medicare and self-pay.  [facility] for [place] [address], [address], [place] Phone: [phone], Fax: [phone]. Hours of Operation:  8:30 am - 5:30 pm, M-F.  Also accepts Medicaid and self-pay.  [facility] [address] [place] Phone: [phone]   [place], [place], [place] [phone], Ext. [phone] Mondays & Thursdays: 7-9 AM.  First 15 patients are seen on a first come, first serve basis.    [place] Providers:  Organization         Address  Phone   Notes  [facility] [address], [place] [phone] Also accepts self-pay patients.  [facility] [address], [place]  [phone]   [place], [address], [place] [phone]   [facility] [address] [place] [phone]   Lucianne Lei [address], [place]   [phone] Only accepts [place] Access [place] patients after they have their name applied to their card.   Self-Pay (no insurance) in [facility]:  Organization         Address  Phone   Notes  Sickle Cell Patients, [facility] Internal Medicine [place] [phone]   [facility]  Urgent Care [place] [phone]   [facility] [place]  [place], [place], [place] [phone]   [facility]/Dr. Osei-Bonsu  [address] [place] or [address], [place] [phone] Phone number for both [place] and [place] locations is the same.  Urgent Medical and [facility] [address] [place] [phone]   [place]  [facility] Adult Mental Health Services [place] or [address] [phone]   [place] [phone], phone; [phone], fax Sees patients 1st and 3rd Saturday of every month.  Must not qualify for public or private insurance (i.e. Medicaid, Medicare, [place] Health Choice, Veterans' Benefits)  Household income should be no more than 200% of the poverty level The clinic cannot treat you if you are pregnant or think you are pregnant  Sexually transmitted diseases are not treated at the clinic.    Dental Care: Organization         Address  Phone  Notes  [facility] [place] [phone] Accepts children up to age 23 who are enrolled in [place] or [place]; pregnant women with a Medicaid card; and children who have applied for Medicaid or [place] Health Choice, but were declined, whose parents can pay a reduced fee at time of service.  [facility]  [address], [place] [phone] Accepts children up to age 60 who are enrolled in [place] or [place]; pregnant women with a Medicaid card; and children who have applied for Medicaid or [place] Health Choice, but were declined, whose parents can pay a reduced fee at time of service.  [place] Adult Dental Access PROGRAM  [place] [phone] Patients are seen by appointment only. Walk-ins  are not accepted. [place] will see patients 69 years of age and older. Monday - Tuesday (8am-5pm) Most Wednesdays (8:30-5pm) $30 per visit, cash only  [place] Adult Dental Access PROGRAM  [address], [facility] [phone] Patients are seen by appointment only. Walk-ins are not accepted. [place] will see patients 42 years of age and older. One Wednesday Evening (Monthly: Volunteer Based).  $30 per visit, cash only  [place]  [phone] for adults; Children under age 75, call Graduate Pediatric Dentistry at [phone]. Children aged 42-14, please call [phone] to request a pediatric application.  Dental services are provided in all areas of dental care including fillings, crowns and bridges, complete and partial dentures, implants, gum treatment, root canals, and extractions. Preventive care is also provided. Treatment is provided to both adults and children. Patients are selected via a lottery and there is often a waiting list.   [facility] [address] [place]  [phone] [email or web address]   [facility] [address] [place], [place] [phone], Ext. [phone] Second and Fourth Thursday of each month, opens at 6:30 AM; Clinic ends at 9 AM.  Patients are seen on a first-come first-served basis, and a limited number are seen during each clinic.   [facility]  [address] [place] Danker [place], [place] [phone]   Eligibility Requirements You must have lived in [place], [place], or [place] counties for at least the last three months.   You cannot be eligible for state or federal sponsored [employer], including [employer], [place], or [employer].   You generally cannot be eligible for healthcare insurance through your employer.    How to apply: Eligibility screenings are held every Tuesday and Wednesday afternoon from 1:00 pm until 4:00 pm. You do not need an appointment  for the interview!  [facility] [address] [place], [place]   [place]  [place] Department  [place]  [phone]    Behavioral Health Resources in the Community: Intensive Outpatient Programs Organization         Address  Phone  Notes  [place]. [address] [place], [place] [phone]   [facility] Outpatient [address], [place], [place]   ADS: Alcohol & Drug Svcs [address] [place], [place]   [place] [address]  [place], [place] or [phone]   Substance Abuse Resources Organization         Address  Phone  Notes  Alcohol and Drug Services  [phone]   [place]  [phone]   [facility]   Chinita Pester  [phone]   Residential & Outpatient Substance Abuse Program  [phone]   Psychological Services Organization         Address  Phone  Notes  [facility] [place]  [place]  [phone]   [place] [address] [place] or [phone]    Mobile Crisis Teams Organization         Address  Phone  Notes  [facility], Mobile Crisis Care Unit  [phone]   [facility] Psychotherapeutic Services  [address] [place], [place]   [place] Levels [address] [place] [phone]    Self-Help/Support Groups Organization         Address  Phone             Notes  [facility] of [place] - variety of support groups  [place] Call for more information  Narcotics Anonymous (NA), [facility]aring [facility] [address], [employer] [place]  2 meetings at this location   Special educational needs teacher         Address  Phone  Notes  ASAP Residential  Treatment [place],    [place]  [phone]   [facility]  [address] [place] [address]01, [place], [place]   [facility] [place], [place] [phone] Admissions: 8am-3pm M-F  Incentives Substance [place] [address],    [place], [place] [phone]   [facility] [address] [place], [place], [place]   [facility] [address]  [place], [place]   [facility] Programs - Intensive Outpatient [address] Kristeen [address], [place], [place]   [facility].,  [place], [place] [phone] or [phone]   [facility]idential Treatment Services (RTS) [address] [place], [place] Accepts Medicaid  [facility] [place] [address]  [place] [phone] Substance Abuse/Addiction Treatment   [facility] Organization         Address  Phone  Notes  [facility]  [phone]   Domenic Schwab, PhD [address] Arlis [place], [place]   [phone] or [phone]   [facility] [place], [place] [phone]   [place] [address], [place], [place] [phone] Insurance/Medicaid/sponsorship through [employer] and Families [address], [address]  [place], [place] [phone] [place], [place] [phone]    Dr. Adele Schilder  [phone]   [facility] Dept. 1) [address] [place] 2) [place] 3)  [place], [place] [phone]  [phone]   [place] [phone] or [phone] (After Hours)
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
## 2016-01-11 ENCOUNTER — Emergency Department (HOSPITAL_COMMUNITY): Payer: Self-pay

## 2016-01-11 ENCOUNTER — Emergency Department (HOSPITAL_COMMUNITY)
Admission: EM | Admit: 2016-01-11 | Discharge: 2016-01-11 | Disposition: A | Discharge status: Home / Self Care | Payer: Self-pay | Attending: Emergency Medicine | Admitting: Emergency Medicine

## 2016-01-11 ENCOUNTER — Encounter (HOSPITAL_COMMUNITY): Payer: Self-pay | Admitting: Emergency Medicine

## 2016-01-11 DIAGNOSIS — R739 Hyperglycemia, unspecified: Secondary | ICD-10-CM | POA: Insufficient documentation

## 2016-01-11 DIAGNOSIS — F17210 Nicotine dependence, cigarettes, uncomplicated: Secondary | ICD-10-CM | POA: Insufficient documentation

## 2016-01-11 DIAGNOSIS — I10 Essential (primary) hypertension: Secondary | ICD-10-CM | POA: Insufficient documentation

## 2016-01-11 DIAGNOSIS — M25471 Effusion, right ankle: Secondary | ICD-10-CM | POA: Insufficient documentation

## 2016-01-11 DIAGNOSIS — M2550 Pain in unspecified joint: Secondary | ICD-10-CM

## 2016-01-11 DIAGNOSIS — M79645 Pain in left finger(s): Secondary | ICD-10-CM | POA: Insufficient documentation

## 2016-01-11 LAB — BASIC METABOLIC PANEL
Anion gap: 9 (ref 5–15)
BUN: 9 mg/dL (ref 6–20)
CO2: 24 mmol/L (ref 22–32)
Calcium: 9.3 mg/dL (ref 8.9–10.3)
Chloride: 103 mmol/L (ref 101–111)
Creatinine, Ser: 0.85 mg/dL (ref 0.44–1.00)
GFR calc Af Amer: >60 mL/min (ref >60)
GFR calc non Af Amer: >60 mL/min (ref >60)
Glucose, Bld: 221 mg/dL — ABNORMAL HIGH (ref 65–99)
Potassium: 4 mmol/L (ref 3.5–5.1)
Sodium: 136 mmol/L (ref 135–145)

## 2016-01-11 IMAGING — DX DG FINGER INDEX 2+V*R*
3 series · 3 of 3 positions shown · non-contrast
Comparison: None.

CLINICAL DATA: Right index finger pain for 2 months. Decreased
range of motion at the MCP joint. No known injury.

EXAM:
RIGHT INDEX FINGER 2+V

[finger ap]
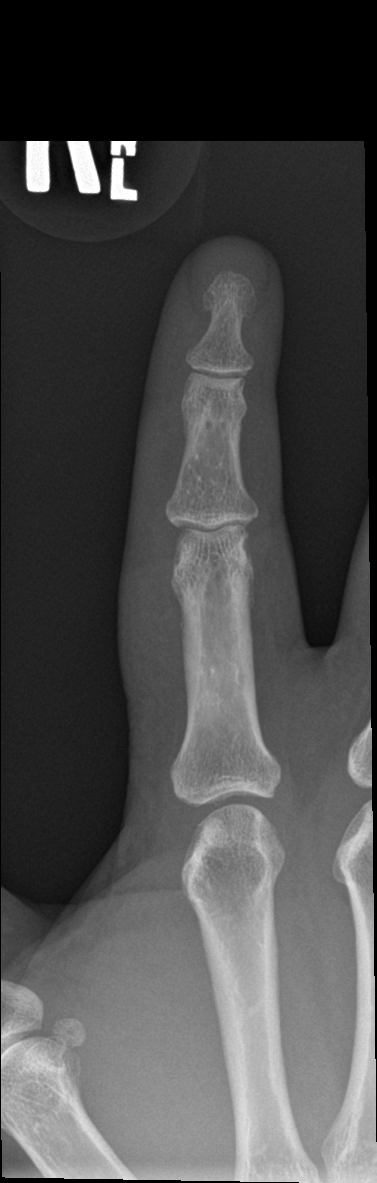

[finger obl]
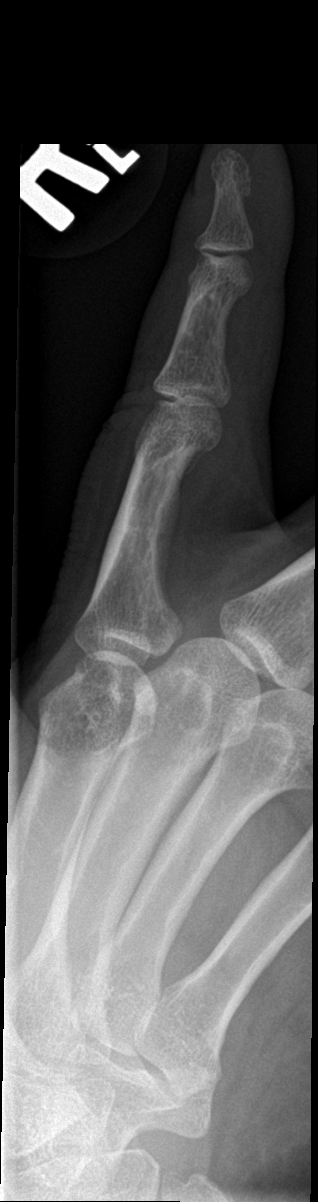

[finger lat]
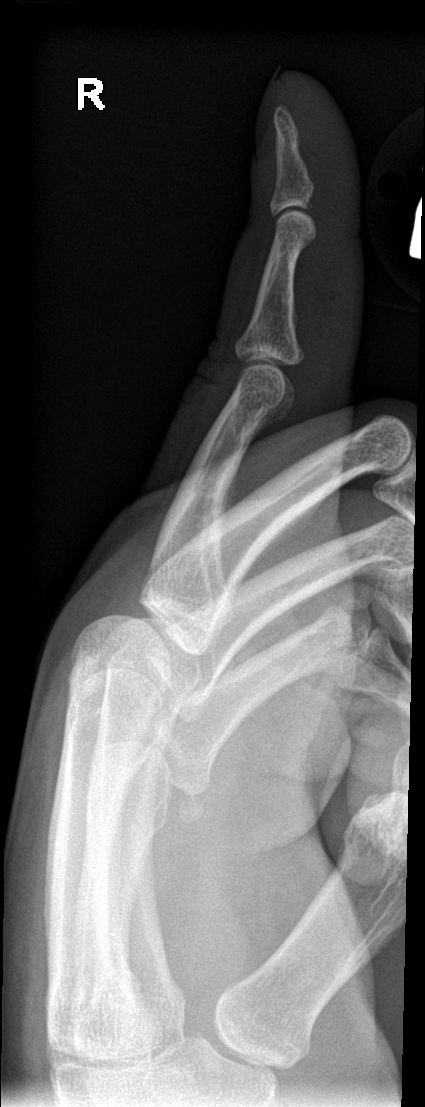

[3 of 3 positions shown; findings below may reference images not displayed]

FINDINGS: There is no fracture or dislocation or bone destruction. Minimal
arthritic changes of the IP joints of the index finger. Very subtle
degenerative changes at the MCP joint.
IMPRESSION: Minimal arthritic changes of the index finger as described.

## 2016-01-11 IMAGING — DX DG ANKLE COMPLETE 3+V*R*
3 series · 3 of 3 positions shown · non-contrast
Comparison: None

CLINICAL DATA: Right ankle pain.  No known injury.

EXAM:
RIGHT ANKLE - COMPLETE 3+ VIEW

[ankle ap]
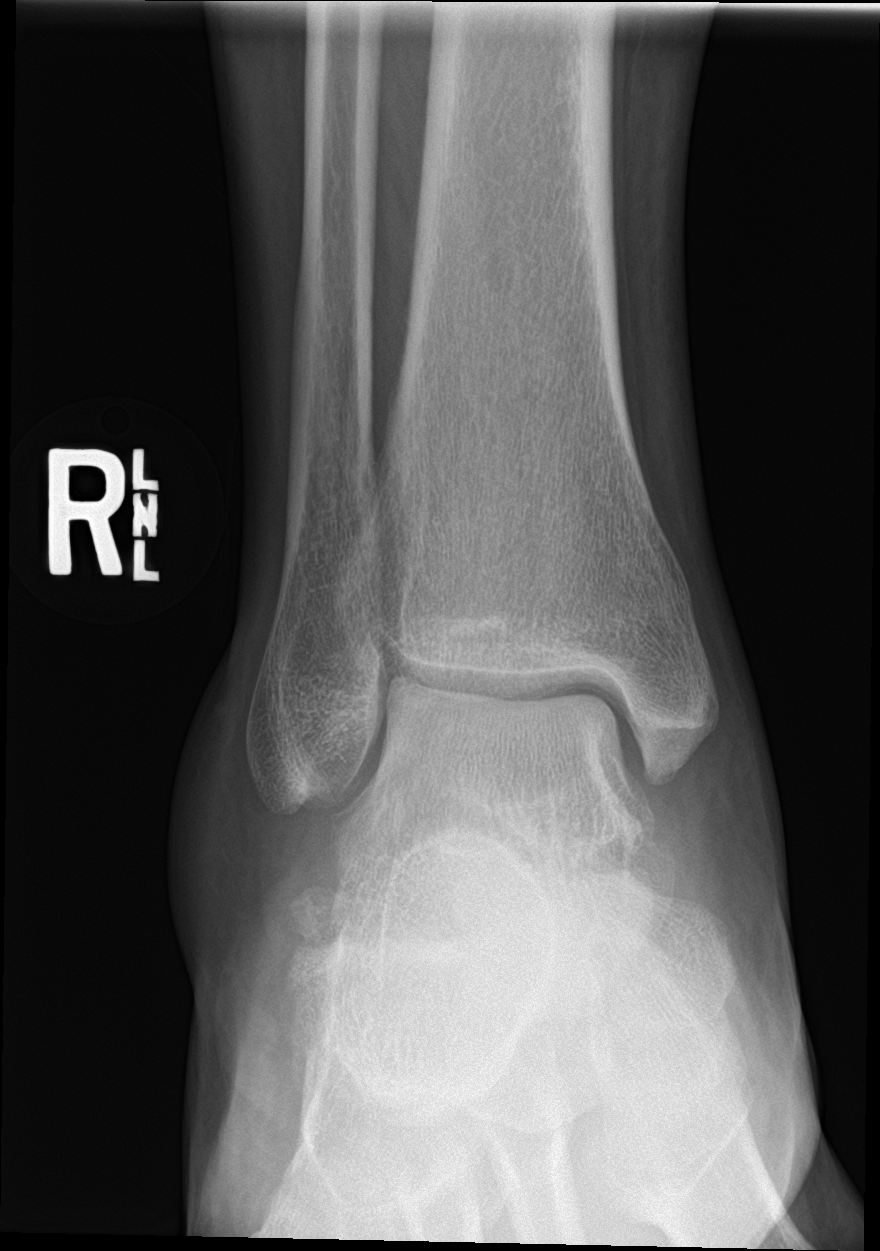

[ankle obl]
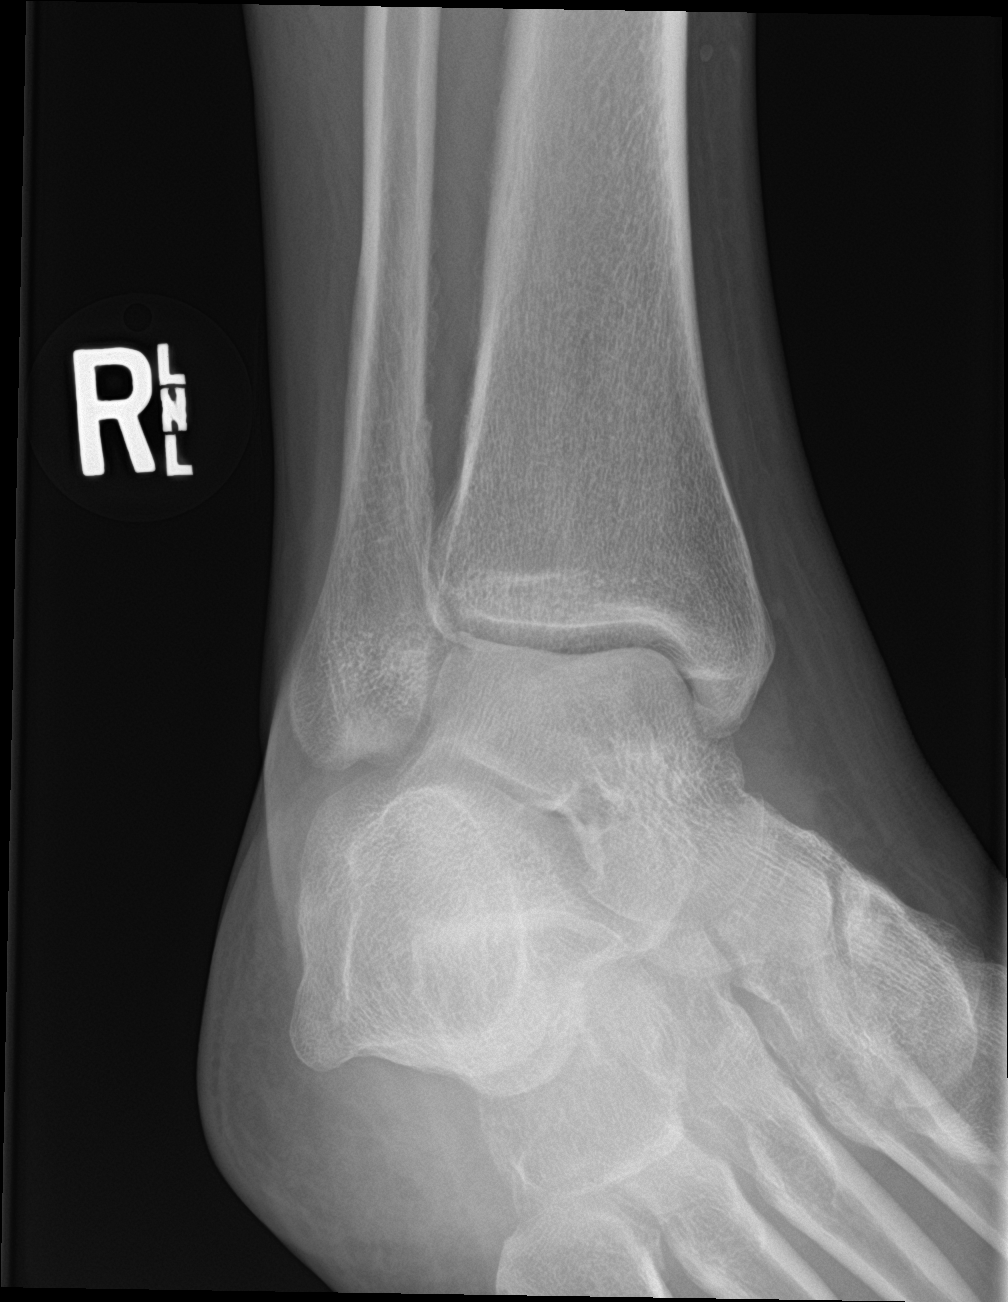

[ankle lat]
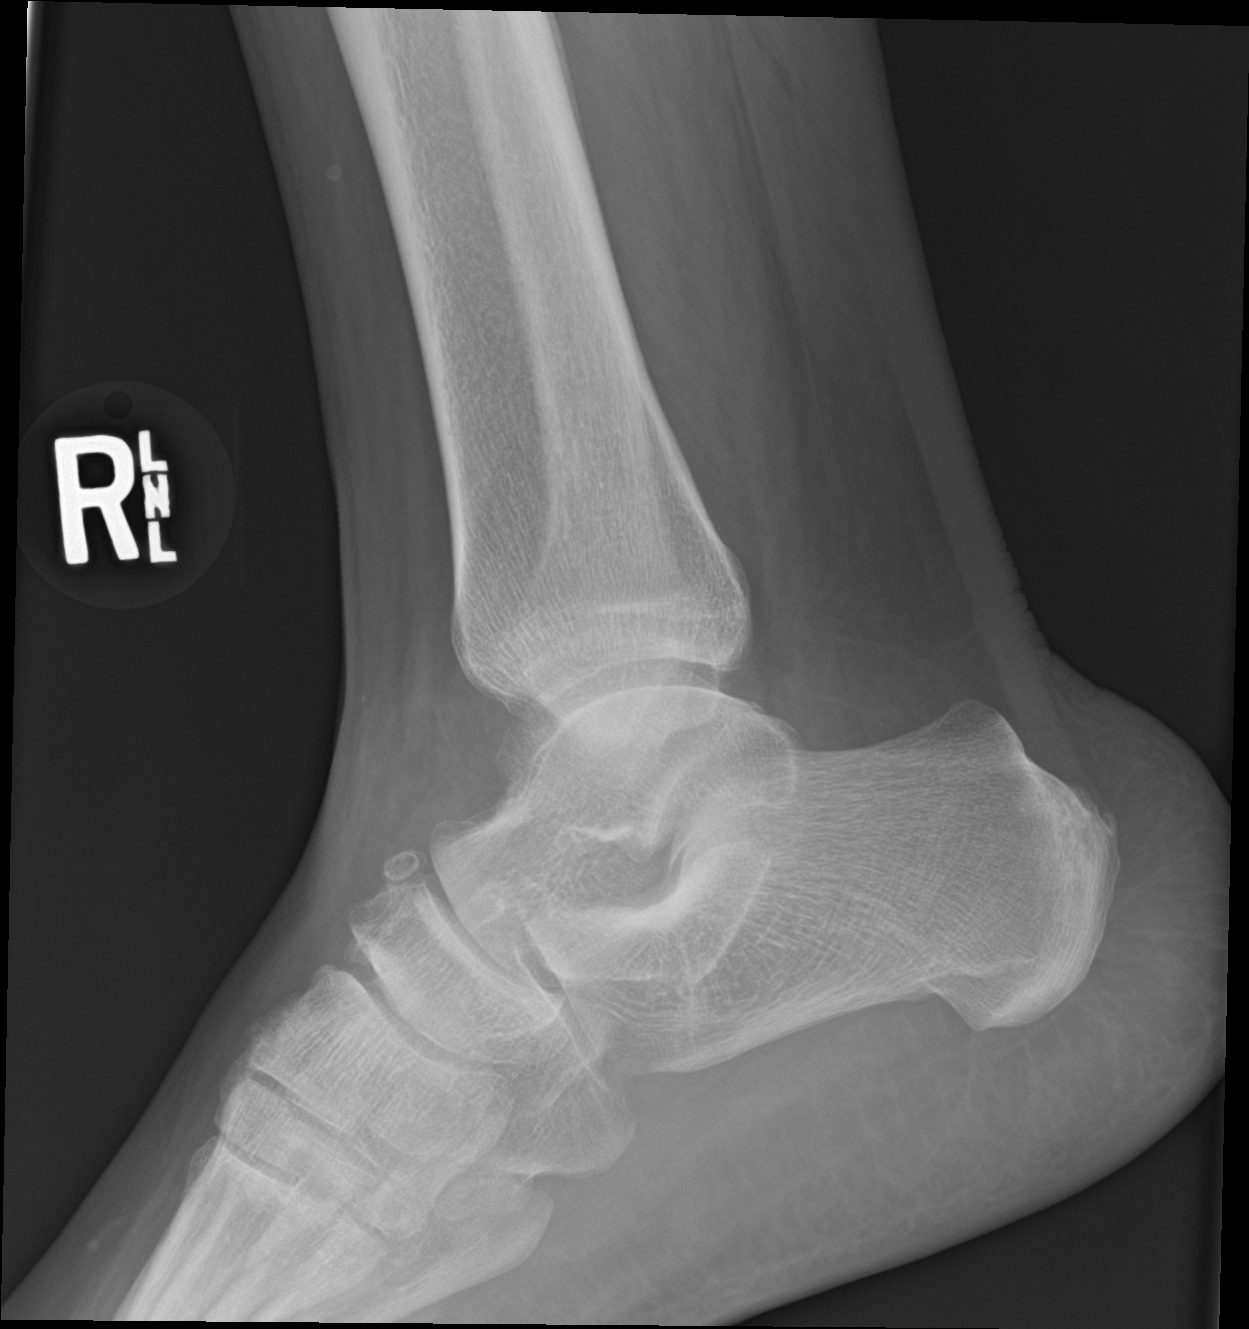

[3 of 3 positions shown; findings below may reference images not displayed]

FINDINGS: There is no fracture or dislocation. Small ankle joint effusion.
Dorsal spurs in the midfoot.
IMPRESSION: Small ankle joint effusion, nonspecific.

## 2016-01-11 IMAGING — DX DG FINGER THUMB 2+V*L*
3 series · 3 of 3 positions shown · non-contrast
Comparison: None

CLINICAL DATA: Left thumb pain for 2 days.  No known injury.

EXAM:
LEFT THUMB 2+V

[finger ap]
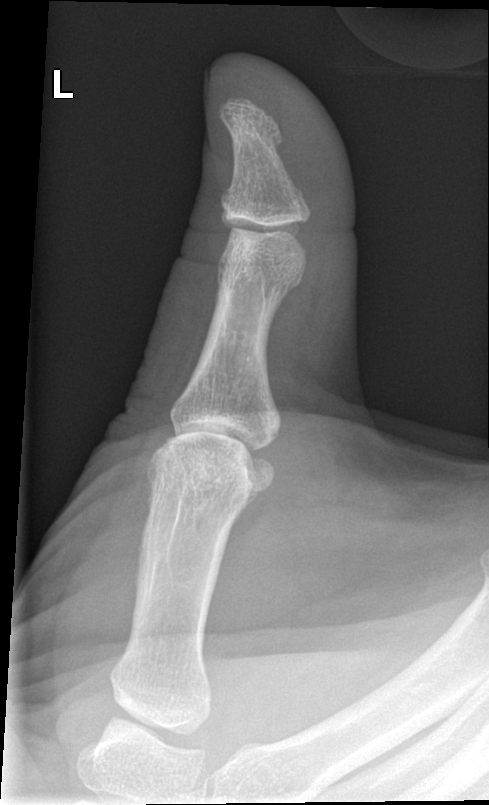

[finger obl]
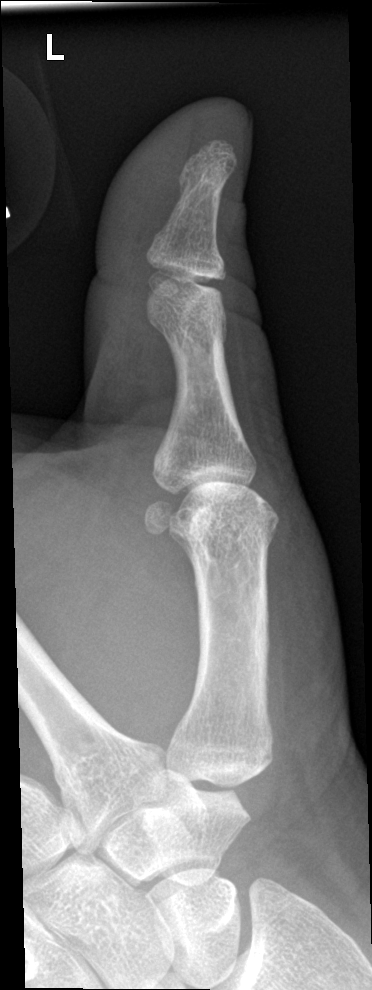

[finger lat]
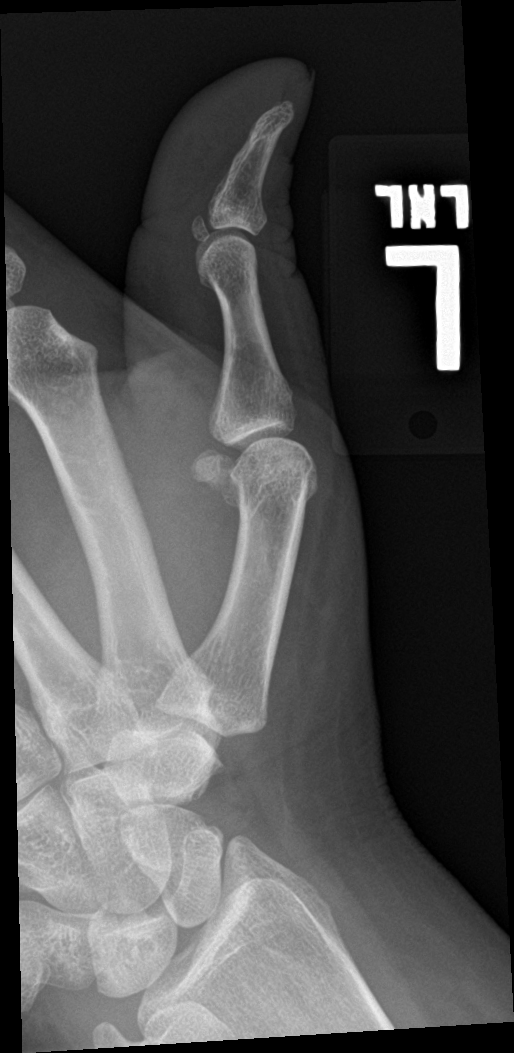

[3 of 3 positions shown; findings below may reference images not displayed]

FINDINGS: Minimal osteophytes at the first MCP joint and at the IP joint of
the thumb. No joint space narrowing. First CMC joint is normal.
IMPRESSION: Minimal arthritic changes as described.

## 2016-01-11 MED ORDER — HYDROCHLOROTHIAZIDE 25 MG PO TABS: 25.0000 mg | ORAL_TABLET | Freq: Every day | ORAL | 0 refills | Status: DC

## 2016-01-11 NOTE — Discharge Instructions (Signed)
Read the information below.  Use the prescribed medication as directed.  Please discuss all new medications with your pharmacist.  You may return to the Emergency Department at any time for worsening condition or any new symptoms that concern you.   If you develop fevers, uncontrolled pain, weakness or numbness of the extremity, severe discoloration of the skin,  return to the ER for a recheck.

## 2016-01-11 NOTE — Progress Notes (Signed)
Orthopedic Tech Progress Note Patient Details:  Hannah Dougherty [birth year] [id number]  Ortho Devices Type of Ortho Device: ASO Ortho Device/Splint Location: rle Ortho Device/Splint Interventions: Application   [place], Georgie Haque 01/11/2016, 12:25 PM

## 2016-01-11 NOTE — ED Triage Notes (Signed)
Pt states she has been having bilateral upper thigh muscle cramps. Also complains of right ankle pain that has caused her to limp- reports that it is swollen. Denies injury to site. Pt states her left thumb pops when she bends it and it hurts. Pt will be ESI 3 due to BP.

## 2016-01-11 NOTE — ED Provider Notes (Signed)
[facility] DEPT Provider Note   CSN: [id number] Arrival date & time: 01/11/16  0903     History   Chief Complaint Chief Complaint  Patient presents with  . Leg Pain    HPI Hannah Dougherty is a 45 y.o. female.  HPI   Pt presents with ongoing, worsening arthralgias and myalgias that began several months ago.  States she has had intermittent cramping in her bilateral thighs and calves, worse at night x several months.  Two months ago her right 2nd MCP began hurting and she has since had decreased ROM in that joint.  One week ago the base of her left thumb suddenly began hurting.  3 days ago her right ankle began hurting and swelling, mostly at the end of a long shift at work.  Started new job 3 weeks ago.   Denies any injury.  Denies fevers, recent infection.  No know family history of rheumatoid arthritis or other autoimmune disorders.   Pt does not take any medications for her symptoms - she has tried rubbing alcohol and massage.  She does not have a PCP.   History reviewed. No pertinent past medical history.  There are no active problems to display for this patient.   Past Surgical History:  Procedure Laterality Date  . TUBAL LIGATION    . VOCAL CORD LATERALIZATION, ENDOSCOPIC APPROACH W/ MLB      OB History    No data available       Home Medications    Prior to Admission medications   Medication Sig Start Date End Date Taking? Authorizing Provider  albuterol (PROVENTIL HFA;VENTOLIN HFA) 108 (90 BASE) MCG/ACT inhaler Inhale 2 puffs into the lungs every 6 (six) hours as needed for wheezing or shortness of breath. 11/30/14   Katheren Shams, DO  hydrochlorothiazide (HYDRODIURIL) 25 MG tablet Take 1 tablet (25 mg total) by mouth daily. 01/11/16   Clayton Bibles, PA-C    Family History History reviewed. No pertinent family history.  Social History Social History  Substance Use Topics  . Smoking status: Current Every Day Smoker    Packs/day: 1.50    Types: Cigarettes    . Smokeless tobacco: Never Used  . Alcohol use Yes     Allergies   Review of patient's allergies indicates no known allergies.   Review of Systems Review of Systems  All other systems reviewed and are negative.    Physical Exam Updated Vital Signs BP 194/94   Pulse 61   Temp 98.2 F (36.8 C)   Resp 18   Wt 108.1 kg   LMP 01/04/2016   SpO2 98%   BMI 39.67 kg/m   Physical Exam  Constitutional: She appears well-developed and well-nourished. No distress.  HENT:  Head: Normocephalic and atraumatic.  Neck: Neck supple.  Cardiovascular: Normal rate and regular rhythm.   Pulmonary/Chest: Effort normal and breath sounds normal. No respiratory distress. She has no wheezes. She has no rales.  Abdominal: Soft. She exhibits no distension. There is no tenderness. There is no rebound and no guarding.  Musculoskeletal:  Left 1st MCP, right 2nd MCP, right ankle with no erythema, edema, warmth, tenderness. Pt does have decreased flexion at right 2nd MCP.   Mild soft edema over right medial ankle, approximately symmetric to left.    Neurological: She is alert.  Skin: She is not diaphoretic.  Nursing note and vitals reviewed.    ED Treatments / Results  Labs (all labs ordered are listed, but only abnormal results are  displayed) Labs Reviewed  BASIC METABOLIC PANEL - Abnormal; Notable for the following:       Result Value   Glucose, Bld 221 (*)    All other components within normal limits  HEMOGLOBIN A1C    EKG  EKG Interpretation None       Radiology Dg Ankle Complete Right  Result Date: 01/11/2016 CLINICAL DATA:  Right ankle pain.  No known injury. EXAM: RIGHT ANKLE - COMPLETE 3+ VIEW COMPARISON:  None FINDINGS: There is no fracture or dislocation. Small ankle joint effusion. Dorsal spurs in the midfoot. IMPRESSION: Small ankle joint effusion, nonspecific. Electronically Signed   By: Lorriane Shire M.D.   On: 01/11/2016 11:39   Dg Finger Index Right  Result  Date: 01/11/2016 CLINICAL DATA:  Right index finger pain for 2 months. Decreased range of motion at the MCP joint. No known injury. EXAM: RIGHT INDEX FINGER 2+V COMPARISON:  None. FINDINGS: There is no fracture or dislocation or bone destruction. Minimal arthritic changes of the IP joints of the index finger. Very subtle degenerative changes at the MCP joint. 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 Electronically Signed   By: Lorriane Shire M.D.   On: 01/11/2016 11:37   Dg Finger Thumb Left  Result Date: 01/11/2016 CLINICAL DATA:  Left thumb pain for 2 days.  No known injury. EXAM: LEFT THUMB 2+V COMPARISON:  None FINDINGS: Minimal osteophytes at the first MCP joint and at the IP joint of the thumb. No joint space narrowing. First CMC joint is normal. IMPRESSION: Minimal arthritic changes as described. Electronically Signed   By: Lorriane Shire M.D.   On: 01/11/2016 11:38    Procedures Procedures (including critical care time)  Medications Ordered in ED Medications - No data to display   Initial Impression / Assessment and Plan / ED Course  I have reviewed the triage vital signs and the nursing notes.  Pertinent labs & imaging results that were available during my care of the patient were reviewed by me and considered in my medical decision making (see chart for details).  Clinical Course    Afebrile, nontoxic patient with new polyarthralgias over the past few weeks, occasional myalgias x months.  No PCP.  No clinical e/o septic joint or gout.  Xrays demonstrate arthritis.  Ankle with small effusion.  Labs remarkable for hyperglycemia, concern for possible diabetes (pt has eaten today).  I discussed the importance of close follow up and dangers of uncontrolled hypertension and diabetes, discussed strict return precautions.  I do have some concern that she may need further workup for polyarthralgia.  Joints do not appear septic or gouty.  NO recent infections or  illnesses.    D/C home with HCTZ, resources, referral for primary care, home dietary changes for hyperglycemia.  Pt instructed to check blood pressures at local pharmacies and record numbers so that she may show PCP at initial appointment.   A1C also drawn for same reason.  ASO splint placed on ankle for support.  Discussed result, findings, treatment, and follow up  with patient.  Pt given return precautions.  Pt verbalizes understanding and agrees with plan.       Final Clinical Impressions(s) / ED Diagnoses   Final diagnoses:  Essential hypertension  Hyperglycemia  Arthralgia, unspecified joint    New Prescriptions Discharge Medication List as of 01/11/2016 12:13 PM    START taking these medications   Details  hydrochlorothiazide (HYDRODIURIL) 25 MG tablet Take 1 tablet (25 mg  total) by mouth daily., Starting Sat 01/11/2016, Print         [place], [place] 01/11/16 [place], MD 01/11/16 [phone]

## 2016-01-12 LAB — HEMOGLOBIN A1C
Hgb A1c MFr Bld: 9.9 % — ABNORMAL HIGH (ref 4.8–5.6)
Mean Plasma Glucose: 237 mg/dL

## 2016-01-21 ENCOUNTER — Emergency Department (HOSPITAL_BASED_OUTPATIENT_CLINIC_OR_DEPARTMENT_OTHER)
Admission: EM | Admit: 2016-01-21 | Discharge: 2016-01-21 | Disposition: A | Discharge status: Home / Self Care | Payer: Self-pay | Attending: Emergency Medicine | Admitting: Emergency Medicine

## 2016-01-21 ENCOUNTER — Encounter (HOSPITAL_BASED_OUTPATIENT_CLINIC_OR_DEPARTMENT_OTHER): Payer: Self-pay | Admitting: *Deleted

## 2016-01-21 DIAGNOSIS — K0401 Reversible pulpitis: Secondary | ICD-10-CM | POA: Insufficient documentation

## 2016-01-21 DIAGNOSIS — K029 Dental caries, unspecified: Secondary | ICD-10-CM | POA: Insufficient documentation

## 2016-01-21 DIAGNOSIS — I10 Essential (primary) hypertension: Secondary | ICD-10-CM | POA: Insufficient documentation

## 2016-01-21 DIAGNOSIS — Z79899 Other long term (current) drug therapy: Secondary | ICD-10-CM | POA: Insufficient documentation

## 2016-01-21 DIAGNOSIS — Z87891 Personal history of nicotine dependence: Secondary | ICD-10-CM | POA: Insufficient documentation

## 2016-01-21 HISTORY — DX: Essential (primary) hypertension: I10

## 2016-01-21 MED ORDER — IBUPROFEN 800 MG PO TABS: 800.0000 mg | ORAL_TABLET | Freq: Four times a day (QID) | ORAL | 0 refills | Status: DC | PRN

## 2016-01-21 MED ORDER — IBUPROFEN 800 MG PO TABS
800.0000 mg | ORAL_TABLET | Freq: Once | ORAL | Status: AC
  Administered 2016-01-21: 800 mg via ORAL
  Filled 2016-01-21: qty 1

## 2016-01-21 NOTE — ED Triage Notes (Signed)
C/o upper right toothache started last pm. States tooth is broken.

## 2016-01-21 NOTE — ED Provider Notes (Signed)
[facility] DEPT [facility] Provider Note   CSN: [id number] Arrival date & time: 01/21/16  1107     History   Chief Complaint Chief Complaint  Patient presents with  . Dental Pain    HPI Hannah Dougherty is a 45 y.o. female.  The history is provided by the patient.  Dental Pain   This is a new problem. The current episode started 2 days ago. The problem occurs constantly. The problem has been gradually worsening. The pain is moderate. She has tried acetaminophen for the symptoms. The treatment provided no relief.    Past Medical History:  Diagnosis Date  . Hypertension     There are no active problems to display for this patient.   Past Surgical History:  Procedure Laterality Date  . TUBAL LIGATION    . VOCAL CORD LATERALIZATION, ENDOSCOPIC APPROACH W/ MLB      OB History    No data available       Home Medications    Prior to Admission medications   Medication Sig Start Date End Date Taking? Authorizing Provider  hydrochlorothiazide (HYDRODIURIL) 25 MG tablet Take 1 tablet (25 mg total) by mouth daily. 01/11/16  Yes Clayton Bibles, PA-C    Family History No family history on file.  Social History Social History  Substance Use Topics  . Smoking status: Former Smoker    Packs/day: 1.50    Quit date: 01/14/2016  . Smokeless tobacco: Never Used  . Alcohol use Yes     Allergies   Patient has no known allergies.   Review of Systems Review of Systems  All other systems reviewed and are negative.    Physical Exam Updated Vital Signs BP 193/89 (BP Location: Right Arm)   Pulse (!) 55   Temp 98.3 F (36.8 C) (Oral)   Resp 18   Ht 5\' 5"  (1.651 m)   Wt 238 lb (108 kg)   LMP 01/04/2016   SpO2 99%   BMI 39.61 kg/m   Physical Exam  Constitutional: She is oriented to person, place, and time. She appears well-developed and well-nourished. No distress.  HENT:  Head: Normocephalic.  Nose: Nose normal.  Mouth/Throat: Oropharynx is clear and moist. No oral  lesions. Dental caries (with crown defect on palatal surface of right upper second molar ) present. No dental abscesses.  Eyes: Conjunctivae are normal.  Neck: Neck supple. No tracheal deviation present.  Cardiovascular: Normal rate and regular rhythm.   Pulmonary/Chest: Effort normal. No respiratory distress.  Abdominal: Soft. She exhibits no distension.  Neurological: She is alert and oriented to person, place, and time.  Skin: Skin is warm and dry.  Psychiatric: She has a normal mood and affect.     ED Treatments / Results  Labs (all labs ordered are listed, but only abnormal results are displayed) Labs Reviewed - No data to display  EKG  EKG Interpretation None       Radiology No results found.  Procedures Procedures (including critical care time)  Medications Ordered in ED Medications  ibuprofen (ADVIL,MOTRIN) tablet 800 mg (not administered)     Initial Impression / Assessment and Plan / ED Course  I have reviewed the triage vital signs and the nursing notes.  Pertinent labs & imaging results that were available during my care of the patient were reviewed by me and considered in my medical decision making (see chart for details).  Clinical Course     After thorough assessment of mouth and teeth, Pt appears to have  dental caries without abscess and exposed pulp through crown defect. Pt will be placed on high dose NSAIDs. Pt was advised that definitive care for dental abscess is with a dental health professional.    Final Clinical Impressions(s) / ED Diagnoses   Final diagnoses:  Dental caries  Acute pulpitis    New Prescriptions New Prescriptions   No medications on file     Leo Grosser, MD 01/21/16 1221

## 2016-08-30 ENCOUNTER — Emergency Department (HOSPITAL_COMMUNITY)
Admission: EM | Admit: 2016-08-30 | Discharge: 2016-08-30 | Disposition: A | Discharge status: Home / Self Care | Payer: Self-pay | Attending: Emergency Medicine | Admitting: Emergency Medicine

## 2016-08-30 ENCOUNTER — Encounter (HOSPITAL_COMMUNITY): Payer: Self-pay

## 2016-08-30 DIAGNOSIS — T23011A Burn of unspecified degree of right thumb (nail), initial encounter: Secondary | ICD-10-CM | POA: Insufficient documentation

## 2016-08-30 DIAGNOSIS — T300 Burn of unspecified body region, unspecified degree: Secondary | ICD-10-CM

## 2016-08-30 DIAGNOSIS — W868XXA Exposure to other electric current, initial encounter: Secondary | ICD-10-CM | POA: Insufficient documentation

## 2016-08-30 DIAGNOSIS — Y939 Activity, unspecified: Secondary | ICD-10-CM | POA: Insufficient documentation

## 2016-08-30 DIAGNOSIS — Z87891 Personal history of nicotine dependence: Secondary | ICD-10-CM | POA: Insufficient documentation

## 2016-08-30 DIAGNOSIS — Y999 Unspecified external cause status: Secondary | ICD-10-CM | POA: Insufficient documentation

## 2016-08-30 DIAGNOSIS — I10 Essential (primary) hypertension: Secondary | ICD-10-CM | POA: Insufficient documentation

## 2016-08-30 DIAGNOSIS — Y929 Unspecified place or not applicable: Secondary | ICD-10-CM | POA: Insufficient documentation

## 2016-08-30 MED ORDER — HYDROCHLOROTHIAZIDE 25 MG PO TABS: 25.0000 mg | ORAL_TABLET | Freq: Every day | ORAL | 2 refills | Status: DC

## 2016-08-30 MED ORDER — TETANUS-DIPHTH-ACELL PERTUSSIS 5-2.5-18.5 LF-MCG/0.5 IM SUSP
0.5000 mL | Freq: Once | INTRAMUSCULAR | Status: AC
  Administered 2016-08-30: 0.5 mL via INTRAMUSCULAR
  Filled 2016-08-30: qty 0.5

## 2016-08-30 MED ORDER — FLUCONAZOLE 150 MG PO TABS: 150.0000 mg | ORAL_TABLET | Freq: Every day | ORAL | 0 refills | Status: DC
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
## 2016-08-30 NOTE — ED Triage Notes (Signed)
Pt does not take BP medication d/t causing yeast infections.

## 2016-08-30 NOTE — ED Notes (Signed)
Talked with pt about BP meds.
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
## 2016-08-30 NOTE — Discharge Instructions (Signed)
Recheck at Urgent care tomorrow.   °

## 2016-08-30 NOTE — ED Notes (Signed)
Pt verbalized understanding discharge instructions and denies any further needs or questions at this time. VS stable, ambulatory and steady gait.   

## 2016-10-20 ENCOUNTER — Encounter (HOSPITAL_COMMUNITY): Payer: Self-pay

## 2016-10-20 ENCOUNTER — Emergency Department (HOSPITAL_COMMUNITY)
Admission: EM | Admit: 2016-10-20 | Discharge: 2016-10-20 | Disposition: A | Discharge status: Home / Self Care | Payer: Self-pay | Attending: Emergency Medicine | Admitting: Emergency Medicine

## 2016-10-20 DIAGNOSIS — M19041 Primary osteoarthritis, right hand: Secondary | ICD-10-CM | POA: Insufficient documentation

## 2016-10-20 DIAGNOSIS — Z87891 Personal history of nicotine dependence: Secondary | ICD-10-CM | POA: Insufficient documentation

## 2016-10-20 DIAGNOSIS — M19042 Primary osteoarthritis, left hand: Secondary | ICD-10-CM | POA: Insufficient documentation

## 2016-10-20 DIAGNOSIS — I10 Essential (primary) hypertension: Secondary | ICD-10-CM | POA: Insufficient documentation

## 2016-10-20 MED ORDER — IBUPROFEN 600 MG PO TABS: 600.0000 mg | ORAL_TABLET | Freq: Three times a day (TID) | ORAL | 0 refills | Status: DC | PRN

## 2016-10-20 MED ORDER — AMLODIPINE BESYLATE 5 MG PO TABS
5.0000 mg | ORAL_TABLET | Freq: Once | ORAL | Status: AC
  Administered 2016-10-20: 5 mg via ORAL
  Filled 2016-10-20: qty 1

## 2016-10-20 MED ORDER — HYDROCHLOROTHIAZIDE 25 MG PO TABS: 25.0000 mg | ORAL_TABLET | Freq: Every day | ORAL | 2 refills | Status: DC

## 2016-10-20 NOTE — ED Notes (Signed)
edp notified of bp 

## 2016-10-20 NOTE — ED Triage Notes (Signed)
Patient complains of bilateral hand pain for 2 weeks, states that she thinks its arthritis, also HTN at triage. States that she has BP med prescription but never filled. Alert and oriented, NAD

## 2016-10-20 NOTE — ED Provider Notes (Signed)
[facility] DEPT Provider Note   CSN: [id number] Arrival date & time: 10/20/16  0808     History   Chief Complaint No chief complaint on file.   HPI Hannah Dougherty is a 46 y.o. female.  HPI Pt presents to the ED with pain in her hands.  Patient states she has been told in the past that she has arthritis. 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 It hurts for her to bend her fingers when she is talking in sheets and doing other activities similar to that. She denies any recent falls or injuries. No fevers or chills. No redness. She does feel like her fingers are swollen.  Patient's blood pressure was also noted to be elevated when she checked in. Patient has been told in the past in the emergency room that she is hypertensive. She was prescribed hydrochlorothiazide previously. Patient states she did not take that medication as she was instructed because one time she had a yeast infection and she thinks it was from that medication. Patient does not have a primary care doctor. She denies any trouble with chest pain or shortness of breath. No numbness or weakness. Past Medical History:  Diagnosis Date  . Hypertension     There are no active problems to display for this patient.   Past Surgical History:  Procedure Laterality Date  . TUBAL LIGATION    . VOCAL CORD LATERALIZATION, ENDOSCOPIC APPROACH W/ MLB      OB History    No data available       Home Medications    Prior to Admission medications   Medication Sig Start Date End Date Taking? Authorizing Provider  fluconazole (DIFLUCAN) 150 MG tablet Take 1 tablet (150 mg total) by mouth daily. 08/30/16   [name], PA-C  hydrochlorothiazide (HYDRODIURIL) 25 MG tablet Take 1 tablet (25 mg total) by mouth daily. 10/20/16   Dorie Rank, MD  ibuprofen (ADVIL,MOTRIN) 600 MG tablet Take 1 tablet (600 mg total) by mouth every 8 (eight) hours as needed for moderate pain. 10/20/16   Dorie Rank, MD    Family  History No family history on file.  Social History Social History  Substance Use Topics  . Smoking status: Former Smoker    Packs/day: 1.50    Quit date: 01/14/2016  . Smokeless tobacco: Never Used  . Alcohol use Yes     Allergies   Patient has no known allergies.   Review of Systems Review of Systems  All other systems reviewed and are negative.    Physical Exam Updated Vital Signs BP (!) 219/94 (BP Location: Left Arm)   Pulse (!) 53   Temp 97.9 F (36.6 C) (Oral)   Resp 17   Ht 1.651 m (5\' 5" )   Wt 107.5 kg (237 lb)   SpO2 97%   BMI 39.44 kg/m   Physical Exam  Constitutional: She appears well-developed and well-nourished. No distress.  HENT:  Head: Normocephalic and atraumatic.  Right Ear: External ear normal.  Left Ear: External ear normal.  Eyes: Conjunctivae are normal. Right eye exhibits no discharge. Left eye exhibits no discharge. No scleral icterus.  Neck: Neck supple. No tracheal deviation present.  Cardiovascular: Normal rate.   Pulmonary/Chest: Effort normal. No stridor. No respiratory distress.  Abdominal: She exhibits no distension.  Musculoskeletal: She exhibits tenderness. She exhibits no edema or deformity.  Mild tenderness palpation bilateral index fingers, no erythema or edema, there is some decreased range of motion due  to pain.  Neurological: She is alert. Cranial nerve deficit: no gross deficits.  Skin: Skin is warm and dry. No rash noted.  Psychiatric: She has a normal mood and affect.  Nursing note and vitals reviewed.    ED Treatments / Results    Procedures Procedures (including critical care time)  Medications Ordered in ED Medications  amLODipine (NORVASC) tablet 5 mg (not administered)     Initial Impression / Assessment and Plan / ED Course  I have reviewed the triage vital signs and the nursing notes.  Pertinent labs & imaging results that were available during my care of the patient were reviewed by me and  considered in my medical decision making (see chart for details).    Patient appears to be having trouble with osteoarthritis exacerbated by her work activity. No signs of infection. She has good perfusion. She previously had x-rays. I do not think it is necessary to repeat them at this time. She's not had any falls or injuries.  Patient is hypertensive. I explained to her that I do not think the hydrochlorothiazide that she was prescribed likely contributed to her yeast infection. It was most likely coincidental.  Patient states she will start taking her blood pressure medication. I explained to her the importance of following up with her primary care doctor. I will give her a new prescription for hydrochlorothiazide.    Final Clinical Impressions(s) / ED Diagnoses   Final diagnoses:  Arthritis of finger of both hands  Hypertension, unspecified type    New Prescriptions Current Discharge Medication List  Hydrochlorothiazide and ibuprofen      Dorie Rank, MD 10/20/16 [phone]

## 2017-01-19 ENCOUNTER — Emergency Department (HOSPITAL_COMMUNITY)
Admission: EM | Admit: 2017-01-19 | Discharge: 2017-01-19 | Disposition: A | Discharge status: Home / Self Care | Payer: Self-pay | Attending: Emergency Medicine | Admitting: Emergency Medicine

## 2017-01-19 ENCOUNTER — Emergency Department (HOSPITAL_COMMUNITY): Payer: Self-pay

## 2017-01-19 ENCOUNTER — Encounter (HOSPITAL_COMMUNITY): Payer: Self-pay | Admitting: Emergency Medicine

## 2017-01-19 DIAGNOSIS — S63653A Sprain of metacarpophalangeal joint of left middle finger, initial encounter: Secondary | ICD-10-CM | POA: Insufficient documentation

## 2017-01-19 DIAGNOSIS — Y999 Unspecified external cause status: Secondary | ICD-10-CM | POA: Insufficient documentation

## 2017-01-19 DIAGNOSIS — Y939 Activity, unspecified: Secondary | ICD-10-CM | POA: Insufficient documentation

## 2017-01-19 DIAGNOSIS — Y929 Unspecified place or not applicable: Secondary | ICD-10-CM | POA: Insufficient documentation

## 2017-01-19 DIAGNOSIS — Z87891 Personal history of nicotine dependence: Secondary | ICD-10-CM | POA: Insufficient documentation

## 2017-01-19 DIAGNOSIS — I10 Essential (primary) hypertension: Secondary | ICD-10-CM | POA: Insufficient documentation

## 2017-01-19 DIAGNOSIS — W231XXA Caught, crushed, jammed, or pinched between stationary objects, initial encounter: Secondary | ICD-10-CM | POA: Insufficient documentation

## 2017-01-19 IMAGING — CR DG HAND COMPLETE 3+V*L*
3 series · 3 of 3 positions shown · non-contrast
Comparison: None.

CLINICAL DATA: Injured left middle finger.  Pain in swelling.

EXAM:
LEFT HAND - COMPLETE 3+ VIEW

[hand pa]
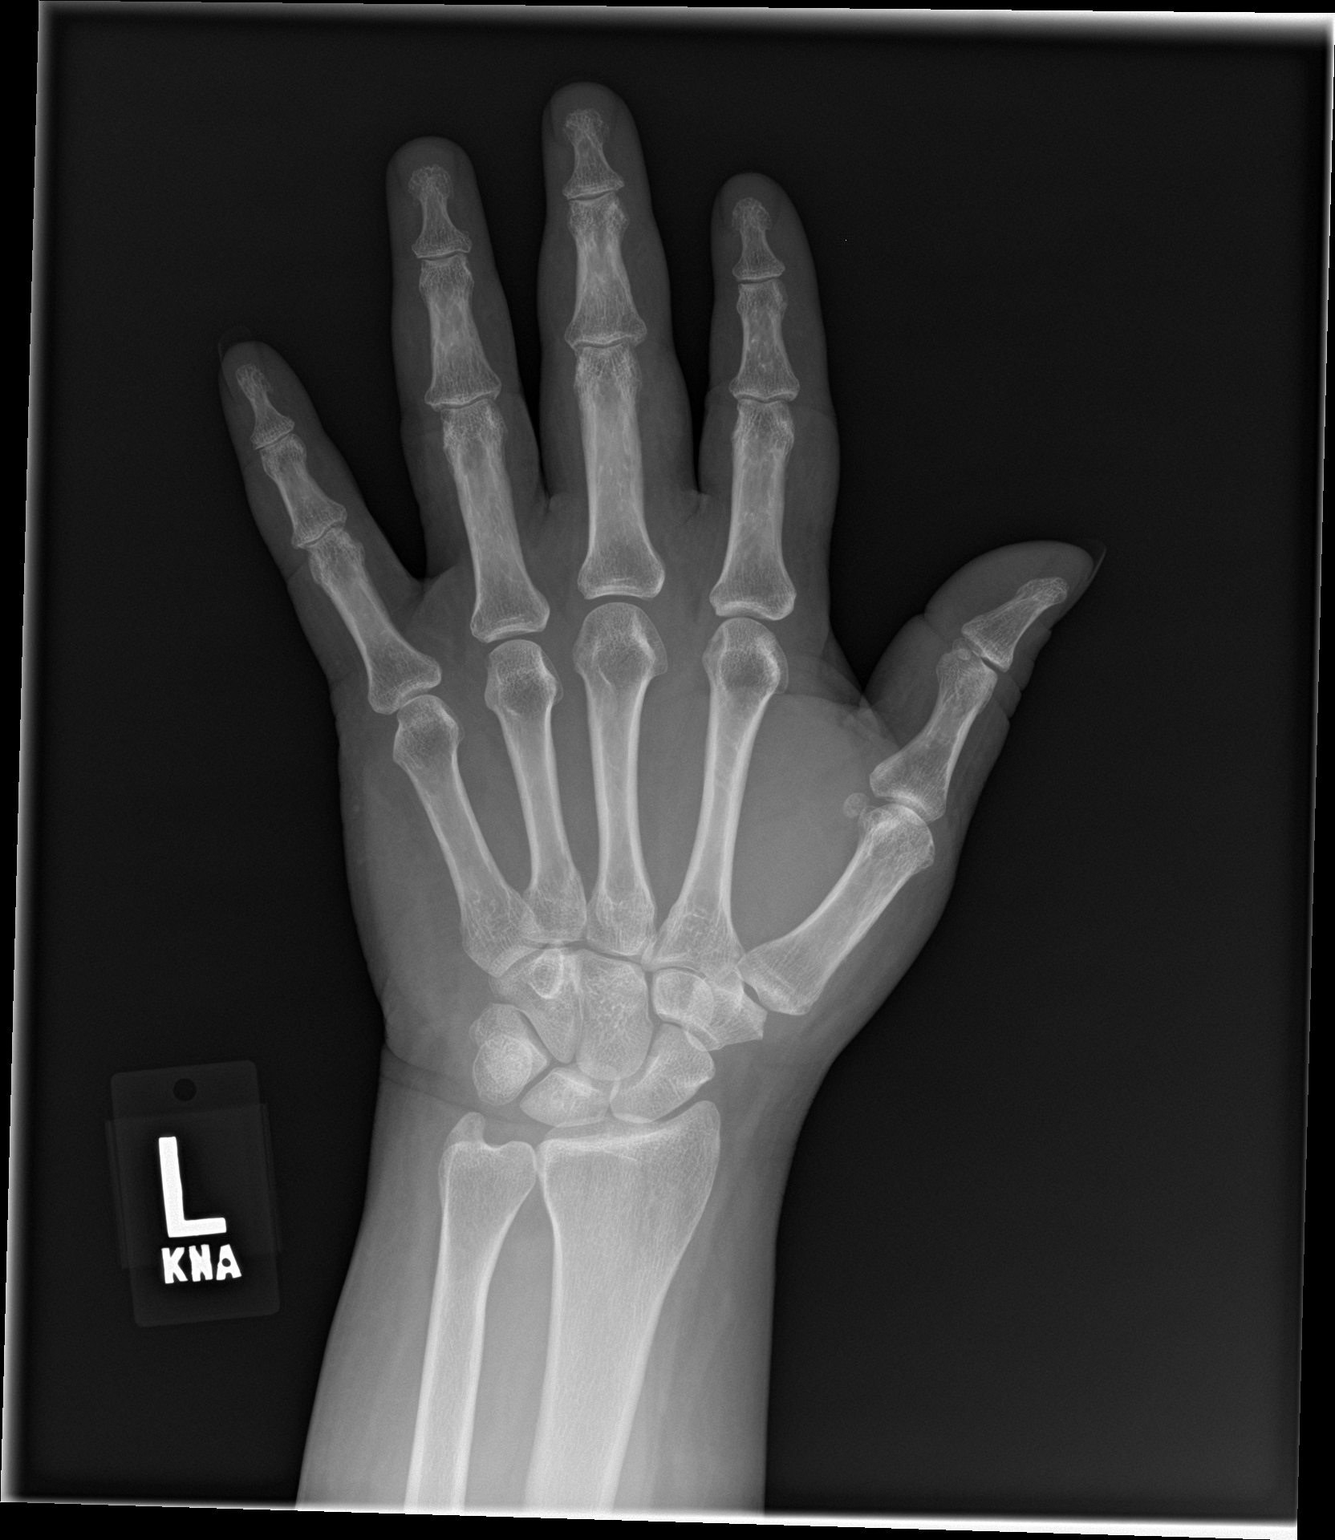

[hand obl]
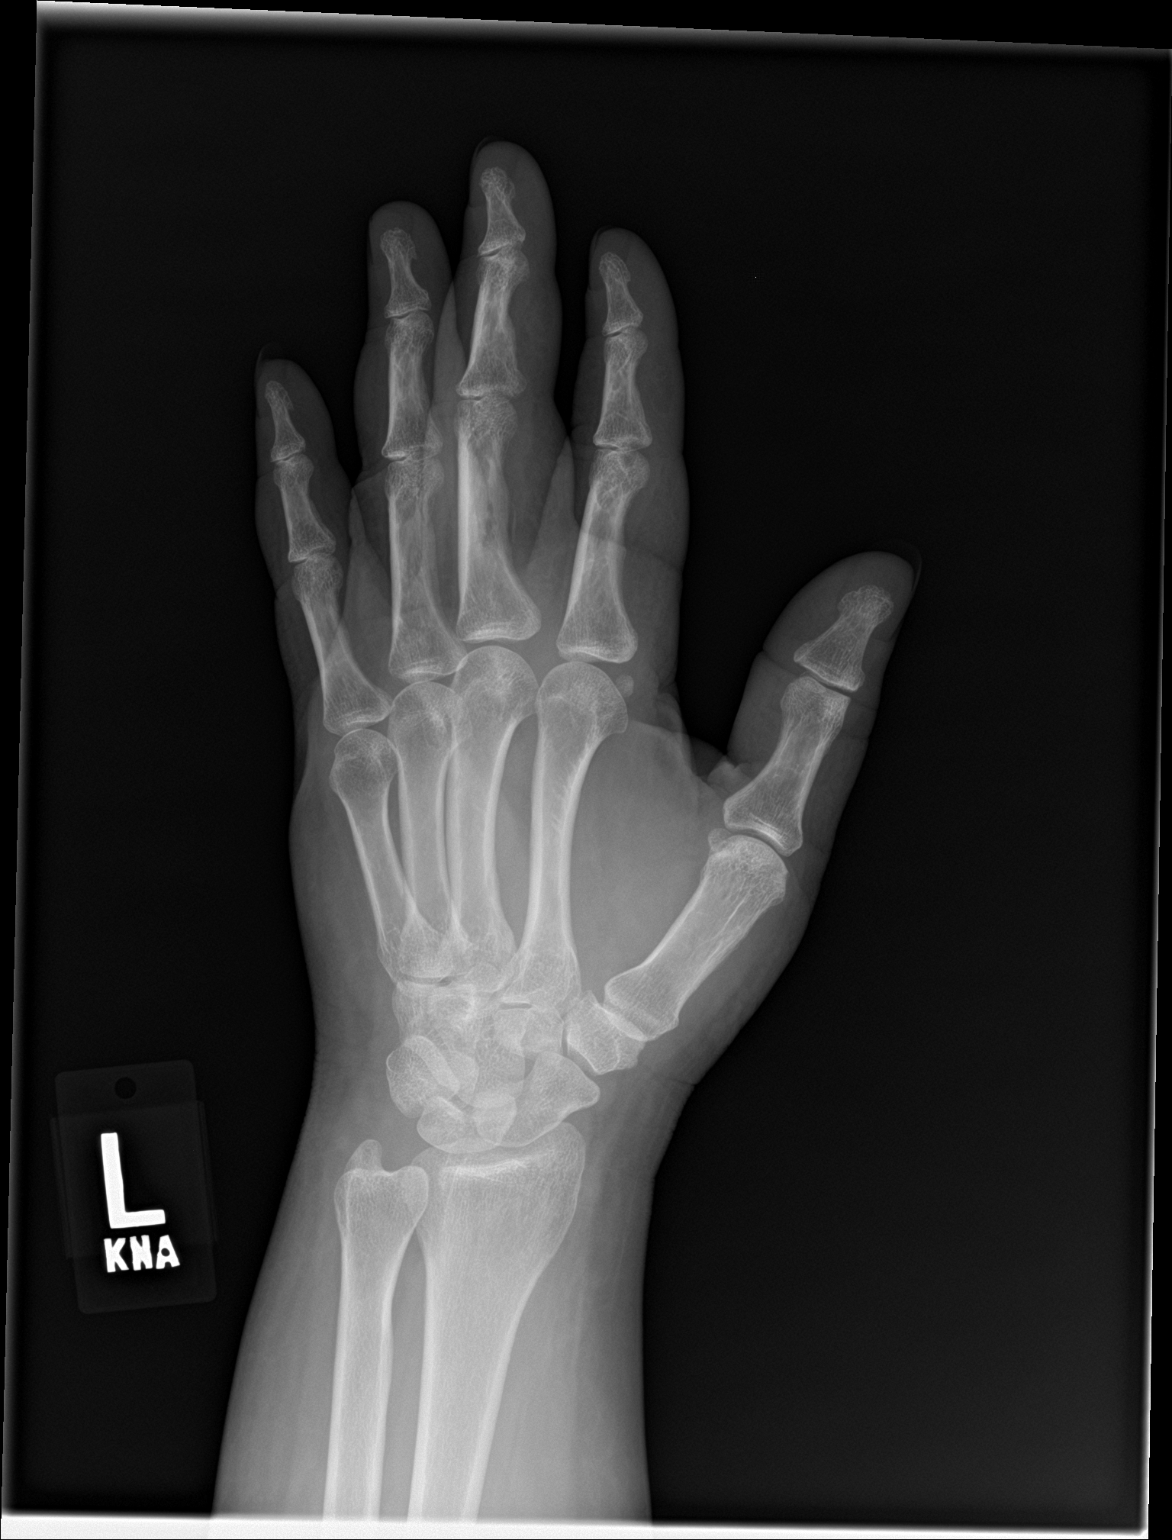

[hand lat]
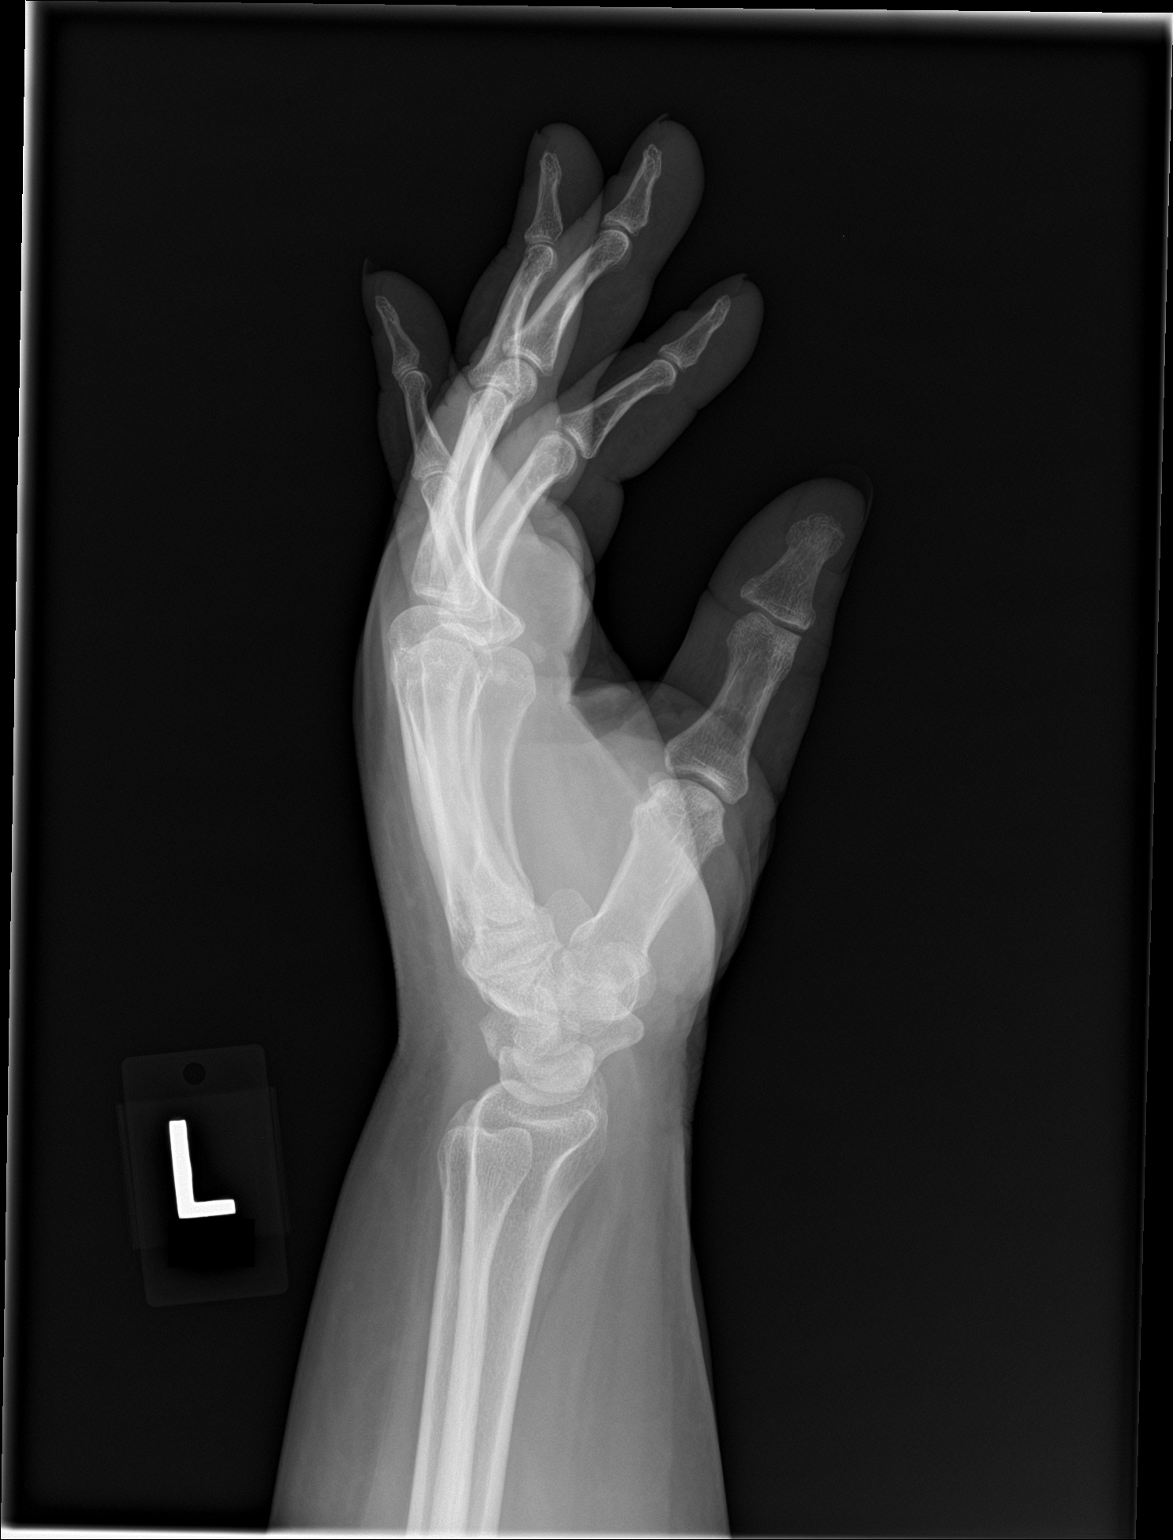

[3 of 3 positions shown; findings below may reference images not displayed]

FINDINGS: Mild degenerative changes involving the DIP and PIP joints for age.
There is also mild periarticular osteopenia. The joint spaces are
otherwise maintained. No acute fracture is identified.
IMPRESSION: No acute bony findings.

## 2017-01-19 MED ORDER — HYDROCHLOROTHIAZIDE 25 MG PO TABS: 25.0000 mg | ORAL_TABLET | Freq: Every day | ORAL | 0 refills | Status: DC

## 2017-01-19 NOTE — Discharge Instructions (Addendum)
Please follow-up with the primary care physician.  They need to manage her blood pressure stayed on have a stroke or heart attack or kidney disease.
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
## 2017-01-19 NOTE — ED Provider Notes (Addendum)
[facility] EMERGENCY DEPARTMENT Provider Note   CSN: [id number] Arrival date & time: 01/19/17  0755     History   Chief Complaint Chief Complaint  Patient presents with  . Finger Injury    HPI Hannah Dougherty is a 46 y.o. female.  46 yo F with a chief complaint of left third digit pain.  She got it stuck on a door when she was trying to close it and it hyperextended.  This occurred yesterday.  Having some pain with movement.  Denies other injury.  The patient was noted to be significantly hypertensive in triage.  She denies any headaches any change in vision any shortness of breath any chest pain.  She has been without her medications for about a month.  Has never seen a family physician due to financial restriction.   The history is provided by the patient.  Illness  This is a new problem. The current episode started yesterday. The problem occurs constantly. The problem has not changed since onset.Pertinent negatives include no chest pain, no headaches and no shortness of breath. The symptoms are aggravated by bending and twisting. Nothing relieves the symptoms. She has tried nothing for the symptoms. The treatment provided no relief.    Past Medical History:  Diagnosis Date  . Hypertension     There are no active problems to display for this patient.   Past Surgical History:  Procedure Laterality Date  . TUBAL LIGATION    . VOCAL CORD LATERALIZATION, ENDOSCOPIC APPROACH W/ MLB      OB History    No data available       Home Medications    Prior to Admission medications   Medication Sig Start Date End Date Taking? Authorizing Provider  ibuprofen (ADVIL,MOTRIN) 600 MG tablet Take 1 tablet (600 mg total) by mouth every 8 (eight) hours as needed for moderate pain. 10/20/16  Yes Dorie Rank, MD  fluconazole (DIFLUCAN) 150 MG tablet Take 1 tablet (150 mg total) by mouth daily. Patient not taking: Reported on 01/19/2017 08/30/16   [name], PA-C    hydrochlorothiazide (HYDRODIURIL) 25 MG tablet Take 1 tablet (25 mg total) daily by mouth. 01/19/17   Deno Etienne, DO    Family History No family history on file.  Social History Social History   Tobacco Use  . Smoking status: Former Smoker    Packs/day: 1.50    Last attempt to quit: 01/14/2016    Years since quitting: 1.0  . Smokeless tobacco: Never Used  Substance Use Topics  . Alcohol use: Yes  . Drug use: Yes    Types: Marijuana     Allergies   Patient has no known allergies.   Review of Systems Review of Systems  Constitutional: Negative for chills and fever.  HENT: Negative for congestion and rhinorrhea.   Eyes: Negative for redness and visual disturbance.  Respiratory: Negative for shortness of breath and wheezing.   Cardiovascular: Negative for chest pain and palpitations.  Gastrointestinal: Negative for nausea and vomiting.  Genitourinary: Negative for dysuria and urgency.  Musculoskeletal: Positive for arthralgias and myalgias.  Skin: Negative for pallor and wound.  Neurological: Negative for dizziness and headaches.     Physical Exam Updated Vital Signs BP (!) 228/89   Pulse (!) 45   Temp 98.4 F (36.9 C) (Oral)   Resp (!) 21   Ht 5\' 5"  (1.651 m)   Wt 104.8 kg (231 lb)   LMP 10/28/2016 (Within Weeks)   SpO2  100%   BMI 38.44 kg/m   Physical Exam  Constitutional: She is oriented to person, place, and time. She appears well-developed and well-nourished. No distress.  HENT:  Head: Normocephalic and atraumatic.  Eyes: EOM are normal. Pupils are equal, round, and reactive to light.  Neck: Normal range of motion. Neck supple.  Cardiovascular: Normal rate and regular rhythm. Exam reveals no gallop and no friction rub.  No murmur heard. Pulmonary/Chest: Effort normal. She has no wheezes. She has no rales.  Abdominal: Soft. She exhibits no distension and no mass. There is no tenderness. There is no guarding.  Musculoskeletal: She exhibits tenderness.  She exhibits no edema.  Mild tenderness to the MTP of the right third digit.  Full range of motion.  Intact sensation to light touch to bilateral lateral aspects of the finger.  Cap refill is normal.  Neurological: She is alert and oriented to person, place, and time. She has normal strength. No cranial nerve deficit or sensory deficit. She displays a negative Romberg sign. Coordination and gait normal. GCS eye subscore is 4. GCS verbal subscore is 5. GCS motor subscore is 6.  Reflex Scores:      Tricep reflexes are 2+ on the right side and 2+ on the left side.      Bicep reflexes are 2+ on the right side and 2+ on the left side.      Brachioradialis reflexes are 2+ on the right side and 2+ on the left side.      Patellar reflexes are 2+ on the right side and 2+ on the left side.      Achilles reflexes are 2+ on the right side and 2+ on the left side. Skin: Skin is warm and dry. She is not diaphoretic.  Psychiatric: She has a normal mood and affect. Her behavior is normal.  Nursing note and vitals reviewed.    ED Treatments / Results  Labs (all labs ordered are listed, but only abnormal results are displayed) Labs Reviewed - No data to display  EKG  EKG Interpretation None       Radiology Dg Hand Complete Left  Result Date: 01/19/2017 CLINICAL DATA:  Injured left middle finger.  Pain in swelling. EXAM: LEFT HAND - COMPLETE 3+ VIEW COMPARISON:  None. FINDINGS: Mild degenerative changes involving the DIP and PIP joints for age. There is also mild periarticular osteopenia. The joint spaces are otherwise maintained. No acute fracture is identified. IMPRESSION: No acute bony findings. Electronically Signed   By: Marijo Sanes M.D.   On: 01/19/2017 08:51    Procedures Procedures (including critical care time)  Medications Ordered in ED Medications - No data to display   Initial Impression / Assessment and Plan / ED Course  I have reviewed the triage vital signs and the nursing  notes.  Pertinent labs & imaging results that were available during my care of the patient were reviewed by me and considered in my medical decision making (see chart for details).     46 yo F with a chief complaint of finger pain.  The patient was also noted to be hypertensive into the 194 systolic.  Improved some with time.  We will start back on her home medication.  I strongly encouraged follow-up with the patient.  Patient asymptomatic with no noted s/s of end organ damage.  No chest pain, diaphoresis, nausea or other acs symptoms.  No headache or neurologic complaints,  no unequal pulses, normal pulse ox without rales or sob.  Feel this is unlikely to be a Hypertensive Emergency and recent studies suggest no benefit for inpatient admission.  There are also no studies to my knowledge suggesting that patients with hypertensive urgency have increased risk for end organ disease.The patient will follow up closely with their PCP.  Compliance with their medication stressed.    Lowell Guitar, Cicero Duck EH, et al. Characteristics and outcomes of patients presenting with hypertensive urgency in the office setting. JAMA Intern Med. 2016 Jul 1; 176(7): 981-8.   11:50 AM:  I have discussed the diagnosis/risks/treatment options with the patient and believe the pt to be eligible for discharge home to follow-up with PCP. We also discussed returning to the ED immediately if new or worsening sx occur. We discussed the sx which are most concerning (e.g., sudden worsening pain, fever, inability to tolerate by mouth) that necessitate immediate return. Medications administered to the patient during their visit and any new prescriptions provided to the patient are listed below.  Medications given during this visit Medications - No data to display   The patient appears reasonably screen and/or stabilized for discharge and I doubt any other medical condition or other [facility] requiring further screening, evaluation,  or treatment in the ED at this time prior to discharge.   Final Clinical Impressions(s) / ED Diagnoses   Final diagnoses:  Sprain of metacarpophalangeal (MCP) joint of left middle finger, initial encounter  Essential hypertension    ED Discharge Orders        Ordered    hydrochlorothiazide (HYDRODIURIL) 25 MG tablet  Daily     01/19/17 1135       Deno Etienne, DO 01/19/17 [place], Marquail Bradwell, DO 01/19/17 1150

## 2018-12-06 ENCOUNTER — Encounter (HOSPITAL_COMMUNITY): Payer: Self-pay | Admitting: Emergency Medicine

## 2018-12-06 ENCOUNTER — Emergency Department (HOSPITAL_COMMUNITY)
Admission: EM | Admit: 2018-12-06 | Discharge: 2018-12-06 | Disposition: A | Discharge status: Home / Self Care | Payer: Self-pay | Attending: Emergency Medicine | Admitting: Emergency Medicine

## 2018-12-06 DIAGNOSIS — I10 Essential (primary) hypertension: Secondary | ICD-10-CM | POA: Insufficient documentation

## 2018-12-06 DIAGNOSIS — R112 Nausea with vomiting, unspecified: Secondary | ICD-10-CM

## 2018-12-06 DIAGNOSIS — Z79899 Other long term (current) drug therapy: Secondary | ICD-10-CM | POA: Insufficient documentation

## 2018-12-06 DIAGNOSIS — E119 Type 2 diabetes mellitus without complications: Secondary | ICD-10-CM | POA: Insufficient documentation

## 2018-12-06 DIAGNOSIS — R197 Diarrhea, unspecified: Secondary | ICD-10-CM

## 2018-12-06 DIAGNOSIS — Z87891 Personal history of nicotine dependence: Secondary | ICD-10-CM | POA: Insufficient documentation

## 2018-12-06 LAB — URINALYSIS, ROUTINE W REFLEX MICROSCOPIC
Bilirubin Urine: NEGATIVE
Glucose, UA: >=500 mg/dL — AB
Ketones, ur: 5 mg/dL — AB
Leukocytes,Ua: NEGATIVE
Nitrite: NEGATIVE
Protein, ur: 100 mg/dL — AB
Specific Gravity, Urine: 1.031 — ABNORMAL HIGH (ref 1.005–1.030)
pH: 6 (ref 5.0–8.0)

## 2018-12-06 LAB — CBG MONITORING, ED
Glucose-Capillary: 191 mg/dL — ABNORMAL HIGH (ref 70–99)
Glucose-Capillary: 319 mg/dL — ABNORMAL HIGH (ref 70–99)

## 2018-12-06 LAB — COMPREHENSIVE METABOLIC PANEL
ALT: 17 U/L (ref 0–44)
AST: 19 U/L (ref 15–41)
Albumin: 4 g/dL (ref 3.5–5.0)
Alkaline Phosphatase: 87 U/L (ref 38–126)
Anion gap: 13 (ref 5–15)
BUN: 17 mg/dL (ref 6–20)
CO2: 27 mmol/L (ref 22–32)
Calcium: 9.5 mg/dL (ref 8.9–10.3)
Chloride: 88 mmol/L — ABNORMAL LOW (ref 98–111)
Creatinine, Ser: 1.09 mg/dL — ABNORMAL HIGH (ref 0.44–1.00)
GFR calc Af Amer: >60 mL/min (ref >60)
GFR calc non Af Amer: 59 mL/min — ABNORMAL LOW (ref >60)
Glucose, Bld: 442 mg/dL — ABNORMAL HIGH (ref 70–99)
Potassium: 4.2 mmol/L (ref 3.5–5.1)
Sodium: 128 mmol/L — ABNORMAL LOW (ref 135–145)
Total Bilirubin: 0.6 mg/dL (ref 0.3–1.2)
Total Protein: 8.2 g/dL — ABNORMAL HIGH (ref 6.5–8.1)

## 2018-12-06 LAB — CBC
HCT: 44.9 % (ref 36.0–46.0)
Hemoglobin: 15.1 g/dL — ABNORMAL HIGH (ref 12.0–15.0)
MCH: 28 pg (ref 26.0–34.0)
MCHC: 33.6 g/dL (ref 30.0–36.0)
MCV: 83.3 fL (ref 80.0–100.0)
Platelets: 295 10*3/uL (ref 150–400)
RBC: 5.39 MIL/uL — ABNORMAL HIGH (ref 3.87–5.11)
RDW: 13.8 % (ref 11.5–15.5)
WBC: 11.4 10*3/uL — ABNORMAL HIGH (ref 4.0–10.5)
nRBC: 0 % (ref 0.0–0.2)

## 2018-12-06 LAB — LIPASE, BLOOD: Lipase: 94 U/L — ABNORMAL HIGH (ref 11–51)

## 2018-12-06 MED ORDER — SODIUM CHLORIDE 0.9% FLUSH
3.0000 mL | Freq: Once | INTRAVENOUS | Status: AC
  Administered 2018-12-06: 3 mL via INTRAVENOUS

## 2018-12-06 MED ORDER — HYDRALAZINE HCL 20 MG/ML IJ SOLN
5.0000 mg | Freq: Once | INTRAMUSCULAR | Status: AC
  Administered 2018-12-06: 5 mg via INTRAVENOUS
  Filled 2018-12-06: qty 1

## 2018-12-06 MED ORDER — LISINOPRIL 10 MG PO TABS: 10.0000 mg | ORAL_TABLET | Freq: Every day | ORAL | 1 refills | Status: DC

## 2018-12-06 MED ORDER — METFORMIN HCL 500 MG PO TABS: 500.0000 mg | ORAL_TABLET | Freq: Two times a day (BID) | ORAL | 1 refills | Status: DC

## 2018-12-06 MED ORDER — ONDANSETRON HCL 4 MG/2ML IJ SOLN
4.0000 mg | Freq: Once | INTRAMUSCULAR | Status: AC
  Administered 2018-12-06: 4 mg via INTRAVENOUS
  Filled 2018-12-06: qty 2

## 2018-12-06 MED ORDER — LISINOPRIL 10 MG PO TABS
10.0000 mg | ORAL_TABLET | Freq: Once | ORAL | Status: AC
  Administered 2018-12-06: 10 mg via ORAL
  Filled 2018-12-06: qty 1

## 2018-12-06 MED ORDER — HYDROCHLOROTHIAZIDE 25 MG PO TABS: 25.0000 mg | ORAL_TABLET | Freq: Every day | ORAL | 1 refills | Status: DC

## 2018-12-06 MED ORDER — ONDANSETRON 4 MG PO TBDP: 4.0000 mg | ORAL_TABLET | Freq: Three times a day (TID) | ORAL | 0 refills | Status: DC | PRN

## 2018-12-06 MED ORDER — HYDROCHLOROTHIAZIDE 25 MG PO TABS
25.0000 mg | ORAL_TABLET | Freq: Every day | ORAL | Status: DC
  Administered 2018-12-06: 25 mg via ORAL
  Filled 2018-12-06: qty 1

## 2018-12-06 MED ORDER — SODIUM CHLORIDE 0.9 % IV BOLUS
1000.0000 mL | Freq: Once | INTRAVENOUS | Status: AC
  Administered 2018-12-06: 1000 mL via INTRAVENOUS

## 2018-12-06 MED ORDER — INSULIN ASPART 100 UNIT/ML ~~LOC~~ SOLN
10.0000 [IU] | Freq: Once | SUBCUTANEOUS | Status: AC
  Administered 2018-12-06: 10 [IU] via INTRAVENOUS

## 2018-12-06 NOTE — ED Triage Notes (Signed)
Pt here from home with c/o n/v  Time a few days , no fevers or sob

## 2018-12-06 NOTE — ED Notes (Signed)
brooklyn [id number] called for update on pt.

## 2018-12-06 NOTE — ED Provider Notes (Signed)
Care transferred from [place], [place] at shift change. See note for full HPI.  In summation "Hannah Dougherty is a 48 y.o. female, with a history of HTN, presenting to the ED with nausea and vomiting over the last two days.  Approximately 5-6 episodes of nonbilious, nonbloody emesis since onset. States it has been over a year since taking her HCTZ. Patient was not very clear about her alcohol consumption.  At one point, she said she drinks every few weeks and to another staff member, she admitted that she drinks multiple times every week. Denies fever/chills, diarrhea, abdominal pain, chest pain, shortness of breath, urinary symptoms, syncope, or any other complaints"  Labs with mildly elevated lipase.  Does have elevated blood glucose.  She does not have prior history of diabetes.  No evidence of DKA.  She was given 10 units subcu insulin with significant provement in her blood sugar.  She does admit to alcohol abuse.  Negative Murphy sign per previous provider.  Low suspicion for cholelithiasis or cholecystitis.  No suspicion for duct obstruction.  He is also had elevated blood pressures in department however these are similar to her previous blood pressures.  Previously on medication however none currently.  Previous provider thought low suspicion for hypertensive urgency or emergency.  She was restarted on her home medicines.  Plan to follow-up on p.o. challenge.  If can tolerate p.o. patient may DC home with close outpatient follow-up.   Physical Exam  BP (!) 207/88   Pulse 63   Temp 98.6 F (37 C) (Oral) Comment: Simultaneous filing. User may not have seen previous data. Comment (Src): Simultaneous filing. User may not have seen previous data.  Resp (!) 21   SpO2 98%   Physical Exam Vitals signs and nursing note reviewed.  Constitutional:      General: She is not in acute distress.    Appearance: She is well-developed. She is not ill-appearing or toxic-appearing.  HENT:     Head: Atraumatic.      Mouth/Throat:     Mouth: Mucous membranes are moist.  Eyes:     Pupils: Pupils are equal, round, and reactive to light.  Neck:     Musculoskeletal: Normal range of motion.  Cardiovascular:     Rate and Rhythm: Normal rate.     Pulses: Normal pulses.     Heart sounds: Normal heart sounds.     Comments: No wheezes, rhonchi or rales. Pulmonary:     Effort: Pulmonary effort is normal. No respiratory distress.     Breath sounds: Normal breath sounds.     Comments: Clear to auscultation bilaterally Abdominal:     General: Bowel sounds are normal. There is no distension.     Tenderness: There is no abdominal tenderness. There is no guarding or rebound.     Comments: Soft, nontender without rebound or guarding.  Musculoskeletal: Normal range of motion.     Comments: Moves all 4 extremities without difficulty.  Homans sign negative.  No lower extremity edema.  Skin:    General: Skin is warm and dry.     Capillary Refill: Capillary refill takes less than 2 seconds.  Neurological:     Mental Status: She is alert.     Comments: Cranial nerves II through XII grossly intact.  No facial droop.  Phonation normal.     ED Course/Procedures     Procedures Labs Reviewed  LIPASE, BLOOD - Abnormal; Notable for the following components:      Result Value  Lipase 94 (*)    All other components within normal limits  COMPREHENSIVE METABOLIC PANEL - Abnormal; Notable for the following components:   Sodium 128 (*)    Chloride 88 (*)    Glucose, Bld 442 (*)    Creatinine, Ser 1.09 (*)    Total Protein 8.2 (*)    GFR calc non Af Amer 59 (*)    All other components within normal limits  CBC - Abnormal; Notable for the following components:   WBC 11.4 (*)    RBC 5.39 (*)    Hemoglobin 15.1 (*)    All other components within normal limits  URINALYSIS, ROUTINE W REFLEX MICROSCOPIC - Abnormal; Notable for the following components:   Specific Gravity, Urine 1.031 (*)    Glucose, UA >=500 (*)     Hgb urine dipstick SMALL (*)    Ketones, ur 5 (*)    Protein, ur 100 (*)    Bacteria, UA RARE (*)    All other components within normal limits  CBG MONITORING, ED - Abnormal; Notable for the following components:   Glucose-Capillary 319 (*)    All other components within normal limits  CBG MONITORING, ED - Abnormal; Notable for the following components:   Glucose-Capillary 191 (*)    All other components within normal limits  HEMOGLOBIN A1C  No results found. MDM   48 year old female presents for nausea vomiting diarrhea.  Abdomen soft, nontender without rebound or guarding.  Negative Murphy sign.  Afebrile, nonseptic, non-ill-appearing.  No hemoptysis, melena, hematochezia.  Labs with mildly elevated lipase.  Does have elevated blood glucose.  She does not have prior history of diabetes.  No evidence of DKA.  She was given 10 units subcu insulin with significant provement in her blood sugar.  She does admit to alcohol abuse.  Negative Murphy sign per previous provider.  Low suspicion for cholelithiasis or cholecystitis.  No suspicion for duct obstruction.  He is also had elevated blood pressures in department however these are similar to her previous blood pressures.  Previously on medication however none currently.  Previous provider thought low suspicion for hypertensive urgency or emergency.  She was restarted on her home medicines.  Plan to follow-up on p.o. challenge.  If can tolerate p.o. patient may DC home with close outpatient follow-up. PO challenge, If pass can give home meds. Low susp for HTN urgency, emergency  With improvement in blood pressure in department with her prior home medications.  She is also had significant provement in her blood sugars with 10 units of insulin fluids.  She is tolerating p.o. intake without difficulty.  On reevaluation abdomen soft, nontender without rebound or guarding.  Nonsurgical abdomen.  Discussed close outpatient follow-up strict return  precautions.  Patient is nontoxic, nonseptic appearing, in no apparent distress.  Patient's pain and other symptoms adequately managed in emergency department.  Fluid bolus given.  Labs, imaging and vitals reviewed.  Patient does not meet the SIRS or Sepsis criteria.  On repeat exam patient does not have a surgical abdomin and there are no peritoneal signs.  No indication of appendicitis, bowel obstruction, bowel perforation, cholecystitis, diverticulitis, PID or ectopic pregnancy, DKA.   The patient has been appropriately medically screened and/or stabilized in the ED. I have low suspicion for any other emergent medical condition which would require further screening, evaluation or treatment in the ED or require inpatient management.  Patient is hemodynamically stable and in no acute distress.  Patient able to ambulate in department  prior to ED.  Evaluation does not show acute pathology that would require ongoing or additional emergent interventions while in the emergency department or further inpatient treatment.  I have discussed the diagnosis with the patient and answered all questions.  Pain is been managed while in the emergency department and patient has no further complaints prior to discharge.  Patient is comfortable with plan discussed in room and is stable for discharge at this time.  I have discussed strict return precautions for returning to the emergency department.  Patient was encouraged to follow-up with PCP/specialist refer to at discharge.        Hari Casaus A, PA-C 12/06/18 1746    Davonna Belling, MD 12/06/18 2318

## 2018-12-06 NOTE — ED Provider Notes (Signed)
[facility] EMERGENCY DEPARTMENT Provider Note   CSN: [id number] Arrival date & time: 12/06/18  1101     History   Chief Complaint No chief complaint on file.   HPI Hannah Dougherty is a 48 y.o. female.     HPI   Hannah Dougherty is a 48 y.o. female, with a history of HTN, presenting to the ED with nausea and vomiting over the last two days.  Approximately 5-6 episodes of nonbilious, nonbloody emesis since onset. States it has been over a year since taking her HCTZ. Patient was not very clear about her alcohol consumption.  At one point, she said she drinks every few weeks and to another staff member, she admitted that she drinks multiple times every week. Denies fever/chills, diarrhea, abdominal pain, chest pain, shortness of breath, urinary symptoms, syncope, or any other complaints.      Past Medical History:  Diagnosis Date  . Hypertension     There are no active problems to display for this patient.   Past Surgical History:  Procedure Laterality Date  . TUBAL LIGATION    . VOCAL CORD LATERALIZATION, ENDOSCOPIC APPROACH W/ MLB       OB History   No obstetric history on file.      Home Medications    Prior to Admission medications   Medication Sig Start Date End Date Taking? Authorizing Provider  fluconazole (DIFLUCAN) 150 MG tablet Take 1 tablet (150 mg total) by mouth daily. Patient not taking: Reported on 01/19/2017 08/30/16   [name], PA-C  hydrochlorothiazide (HYDRODIURIL) 25 MG tablet Take 1 tablet (25 mg total) daily by mouth. 01/19/17   Deno Etienne, DO  ibuprofen (ADVIL,MOTRIN) 600 MG tablet Take 1 tablet (600 mg total) by mouth every 8 (eight) hours as needed for moderate pain. 10/20/16   Dorie Rank, MD    Family History No family history on file.  Social History Social History   Tobacco Use  . Smoking status: Former Smoker    Packs/day: 1.50    Quit date: 01/14/2016    Years since quitting: 2.8  . Smokeless tobacco: Never  Used  Substance Use Topics  . Alcohol use: Yes  . Drug use: Yes    Types: Marijuana     Allergies   Patient has no known allergies.   Review of Systems Review of Systems  Constitutional: Negative for chills, diaphoresis and fever.  Respiratory: Negative for cough and shortness of breath.   Cardiovascular: Negative for chest pain.  Gastrointestinal: Positive for nausea and vomiting. Negative for abdominal pain, blood in stool and diarrhea.  Genitourinary: Negative for dysuria, flank pain and hematuria.  Musculoskeletal: Negative for back pain.  Neurological: Negative for syncope, weakness and numbness.  All other systems reviewed and are negative.    Physical Exam Updated Vital Signs BP (!) 199/106 (BP Location: Left Arm) Comment: Simultaneous filing. User may not have seen previous data.  Pulse 76 Comment: Simultaneous filing. User may not have seen previous data.  Temp 98.6 F (37 C) (Oral) Comment: Simultaneous filing. User may not have seen previous data. Comment (Src): Simultaneous filing. User may not have seen previous data.  Resp 18 Comment: Simultaneous filing. User may not have seen previous data.  SpO2 100% Comment: Simultaneous filing. User may not have seen previous data.  Physical Exam Vitals signs and nursing note reviewed.  Constitutional:      General: She is not in acute distress.    Appearance: She is well-developed. She  is not diaphoretic.  HENT:     Head: Normocephalic and atraumatic.     Mouth/Throat:     Mouth: Mucous membranes are moist.     Pharynx: Oropharynx is clear.  Eyes:     Conjunctiva/sclera: Conjunctivae normal.  Neck:     Musculoskeletal: Neck supple.  Cardiovascular:     Rate and Rhythm: Normal rate and regular rhythm.     Pulses: Normal pulses.          Radial pulses are 2+ on the right side and 2+ on the left side.       Posterior tibial pulses are 2+ on the right side and 2+ on the left side.     Heart sounds: Normal heart  sounds.     Comments: Tactile temperature in the extremities appropriate and equal bilaterally. Pulmonary:     Effort: Pulmonary effort is normal. No respiratory distress.     Breath sounds: Normal breath sounds.  Abdominal:     Palpations: Abdomen is soft.     Tenderness: There is no abdominal tenderness. There is no guarding.  Musculoskeletal:     Right lower leg: No edema.     Left lower leg: No edema.  Lymphadenopathy:     Cervical: No cervical adenopathy.  Skin:    General: Skin is warm and dry.  Neurological:     Mental Status: She is alert and oriented to person, place, and time.     Comments: Sensation grossly intact to light touch in the extremities.  Grip strengths equal bilaterally.  Strength 5/5 in all extremities. No gait disturbance. Coordination intact. Cranial nerves III-XII grossly intact. No facial droop.   Psychiatric:        Mood and Affect: Mood and affect normal.        Speech: Speech normal.        Behavior: Behavior normal.      ED Treatments / Results  Labs (all labs ordered are listed, but only abnormal results are displayed) Labs Reviewed  LIPASE, BLOOD - Abnormal; Notable for the following components:      Result Value   Lipase 94 (*)    All other components within normal limits  COMPREHENSIVE METABOLIC PANEL - Abnormal; Notable for the following components:   Sodium 128 (*)    Chloride 88 (*)    Glucose, Bld 442 (*)    Creatinine, Ser 1.09 (*)    Total Protein 8.2 (*)    GFR calc non Af Amer 59 (*)    All other components within normal limits  CBC - Abnormal; Notable for the following components:   WBC 11.4 (*)    RBC 5.39 (*)    Hemoglobin 15.1 (*)    All other components within normal limits  URINALYSIS, ROUTINE W REFLEX MICROSCOPIC - Abnormal; Notable for the following components:   Specific Gravity, Urine 1.031 (*)    Glucose, UA >=500 (*)    Hgb urine dipstick SMALL (*)    Ketones, ur 5 (*)    Protein, ur 100 (*)    Bacteria, UA  RARE (*)    All other components within normal limits    EKG None  Radiology No results found.  Procedures Procedures (including critical care time)  Medications Ordered in ED Medications  hydrochlorothiazide (HYDRODIURIL) tablet 25 mg (25 mg Oral Given 12/06/18 1624)  sodium chloride flush (NS) 0.9 % injection 3 mL (3 mLs Intravenous Given 12/06/18 1425)  sodium chloride 0.9 % bolus 1,000 mL (1,000  mLs Intravenous New Bag/Given 12/06/18 1420)  ondansetron (ZOFRAN) injection 4 mg (4 mg Intravenous Given 12/06/18 1420)  hydrALAZINE (APRESOLINE) injection 5 mg (5 mg Intravenous Given 12/06/18 1421)  lisinopril (ZESTRIL) tablet 10 mg (10 mg Oral Given 12/06/18 1624)     Initial Impression / Assessment and Plan / ED Course  I have reviewed the triage vital signs and the nursing notes.  Pertinent labs & imaging results that were available during my care of the patient were reviewed by me and considered in my medical decision making (see chart for details).        Patient presents with nausea and vomiting over the past couple days. Patient is nontoxic appearing, afebrile, not tachycardic, not tachypneic, not hypotensive, maintains excellent SPO2 on room air. Hypertension noted.  She has a mild elevation in her creatinine over previous value, however, my suspicion for hypertensive emergency is low. I suspect her hyponatremia and hypochloremia are due to her vomiting. Hyperglycemia noted without elevated anion gap.  Low suspicion for DKA. She does have an elevated lipase without abdominal pain or abdominal tenderness.  Low suspicion for surgical cause for this elevation. Alcoholic pancreatitis is a consideration.   End of shift patient care handoff report given to Britni Henderly, PA-C. Plan: Continue hydration, oral fluid challenge, improvement in blood pressure.   We will restart the patient's HCTZ.  Due to her high level of hypertension, we will also start lisinopril. It appears  patient also has undiagnosed diabetes.  We will start metformin.   Final Clinical Impressions(s) / ED Diagnoses   Final diagnoses:  None    ED Discharge Orders    None       Layla Maw 12/06/18 1642    Dorie Rank, MD 12/07/18 [phone]

## 2018-12-06 NOTE — Discharge Instructions (Signed)
Take the medications as prescribed.  Return for any new worsening symptoms.  It is important to follow-up with the primary care provider for management of your diabetes and your hypertension.

## 2018-12-06 NOTE — ED Notes (Signed)
Pt aware we need urine but unable to give sample at this time.

## 2018-12-07 LAB — HEMOGLOBIN A1C
Hgb A1c MFr Bld: 11.4 % — ABNORMAL HIGH (ref 4.8–5.6)
Mean Plasma Glucose: 280 mg/dL

## 2018-12-08 ENCOUNTER — Other Ambulatory Visit: Payer: Self-pay

## 2018-12-08 ENCOUNTER — Emergency Department (HOSPITAL_COMMUNITY)
Admission: EM | Admit: 2018-12-08 | Discharge: 2018-12-08 | Disposition: A | Discharge status: Home / Self Care | Payer: Self-pay | Attending: Emergency Medicine | Admitting: Emergency Medicine

## 2018-12-08 ENCOUNTER — Encounter (HOSPITAL_COMMUNITY): Payer: Self-pay | Admitting: Emergency Medicine

## 2018-12-08 DIAGNOSIS — Z5321 Procedure and treatment not carried out due to patient leaving prior to being seen by health care provider: Secondary | ICD-10-CM | POA: Insufficient documentation

## 2018-12-08 HISTORY — DX: Type 2 diabetes mellitus without complications: E11.9

## 2018-12-08 LAB — BASIC METABOLIC PANEL
Anion gap: 13 (ref 5–15)
BUN: 23 mg/dL — ABNORMAL HIGH (ref 6–20)
CO2: 25 mmol/L (ref 22–32)
Calcium: 9 mg/dL (ref 8.9–10.3)
Chloride: 86 mmol/L — ABNORMAL LOW (ref 98–111)
Creatinine, Ser: 1.59 mg/dL — ABNORMAL HIGH (ref 0.44–1.00)
GFR calc Af Amer: 43 mL/min — ABNORMAL LOW (ref >60)
GFR calc non Af Amer: 37 mL/min — ABNORMAL LOW (ref >60)
Glucose, Bld: 436 mg/dL — ABNORMAL HIGH (ref 70–99)
Potassium: 3.4 mmol/L — ABNORMAL LOW (ref 3.5–5.1)
Sodium: 124 mmol/L — ABNORMAL LOW (ref 135–145)

## 2018-12-08 LAB — CBC
HCT: 43.9 % (ref 36.0–46.0)
Hemoglobin: 15.3 g/dL — ABNORMAL HIGH (ref 12.0–15.0)
MCH: 28.3 pg (ref 26.0–34.0)
MCHC: 34.9 g/dL (ref 30.0–36.0)
MCV: 81.1 fL (ref 80.0–100.0)
Platelets: 292 10*3/uL (ref 150–400)
RBC: 5.41 MIL/uL — ABNORMAL HIGH (ref 3.87–5.11)
RDW: 13.3 % (ref 11.5–15.5)
WBC: 9.1 10*3/uL (ref 4.0–10.5)
nRBC: 0 % (ref 0.0–0.2)

## 2018-12-08 LAB — CBG MONITORING, ED: Glucose-Capillary: 466 mg/dL — ABNORMAL HIGH (ref 70–99)

## 2018-12-08 NOTE — ED Notes (Signed)
Unable to locate in waiting room to obtain vitals.

## 2018-12-08 NOTE — ED Triage Notes (Signed)
Pt states she was seen here yesterday for n/v. Pt states she has continued to vomite today. Pt was just diagnosed yesterday with diabetes- A1C 11. Pt CBG >400 today. Pt tearful and fearful about new diagnosis.

## 2018-12-08 NOTE — ED Notes (Signed)
Called about 20 times and unable to locate.

## 2018-12-09 ENCOUNTER — Other Ambulatory Visit: Payer: Self-pay

## 2018-12-09 ENCOUNTER — Encounter (HOSPITAL_COMMUNITY): Payer: Self-pay

## 2018-12-09 ENCOUNTER — Inpatient Hospital Stay (HOSPITAL_COMMUNITY)
Admission: EM | Admit: 2018-12-09 | Discharge: 2018-12-11 | DRG: 683 | Disposition: A | Discharge status: Home / Self Care | Payer: Self-pay | Attending: Internal Medicine | Admitting: Internal Medicine

## 2018-12-09 DIAGNOSIS — E860 Dehydration: Secondary | ICD-10-CM | POA: Diagnosis present

## 2018-12-09 DIAGNOSIS — E871 Hypo-osmolality and hyponatremia: Secondary | ICD-10-CM | POA: Diagnosis present

## 2018-12-09 DIAGNOSIS — Z20828 Contact with and (suspected) exposure to other viral communicable diseases: Secondary | ICD-10-CM | POA: Diagnosis present

## 2018-12-09 DIAGNOSIS — E119 Type 2 diabetes mellitus without complications: Secondary | ICD-10-CM | POA: Diagnosis present

## 2018-12-09 DIAGNOSIS — Z79899 Other long term (current) drug therapy: Secondary | ICD-10-CM

## 2018-12-09 DIAGNOSIS — Z87891 Personal history of nicotine dependence: Secondary | ICD-10-CM

## 2018-12-09 DIAGNOSIS — E861 Hypovolemia: Secondary | ICD-10-CM | POA: Diagnosis present

## 2018-12-09 DIAGNOSIS — Z803 Family history of malignant neoplasm of breast: Secondary | ICD-10-CM

## 2018-12-09 DIAGNOSIS — Z7984 Long term (current) use of oral hypoglycemic drugs: Secondary | ICD-10-CM

## 2018-12-09 DIAGNOSIS — Z791 Long term (current) use of non-steroidal anti-inflammatories (NSAID): Secondary | ICD-10-CM

## 2018-12-09 DIAGNOSIS — Z833 Family history of diabetes mellitus: Secondary | ICD-10-CM

## 2018-12-09 DIAGNOSIS — I10 Essential (primary) hypertension: Secondary | ICD-10-CM | POA: Diagnosis present

## 2018-12-09 DIAGNOSIS — R739 Hyperglycemia, unspecified: Secondary | ICD-10-CM

## 2018-12-09 DIAGNOSIS — E878 Other disorders of electrolyte and fluid balance, not elsewhere classified: Secondary | ICD-10-CM | POA: Diagnosis present

## 2018-12-09 DIAGNOSIS — E1165 Type 2 diabetes mellitus with hyperglycemia: Secondary | ICD-10-CM | POA: Diagnosis present

## 2018-12-09 DIAGNOSIS — N179 Acute kidney failure, unspecified: Principal | ICD-10-CM | POA: Diagnosis present

## 2018-12-09 LAB — BASIC METABOLIC PANEL
Anion gap: 15 (ref 5–15)
BUN: 30 mg/dL — ABNORMAL HIGH (ref 6–20)
CO2: 25 mmol/L (ref 22–32)
Calcium: 8.8 mg/dL — ABNORMAL LOW (ref 8.9–10.3)
Chloride: 83 mmol/L — ABNORMAL LOW (ref 98–111)
Creatinine, Ser: 2.05 mg/dL — ABNORMAL HIGH (ref 0.44–1.00)
GFR calc Af Amer: 32 mL/min — ABNORMAL LOW (ref >60)
GFR calc non Af Amer: 28 mL/min — ABNORMAL LOW (ref >60)
Glucose, Bld: 385 mg/dL — ABNORMAL HIGH (ref 70–99)
Potassium: 3.6 mmol/L (ref 3.5–5.1)
Sodium: 123 mmol/L — ABNORMAL LOW (ref 135–145)

## 2018-12-09 LAB — URINALYSIS, ROUTINE W REFLEX MICROSCOPIC
Bilirubin Urine: NEGATIVE
Glucose, UA: NEGATIVE mg/dL
Hgb urine dipstick: NEGATIVE
Ketones, ur: NEGATIVE mg/dL
Nitrite: NEGATIVE
Protein, ur: NEGATIVE mg/dL
Specific Gravity, Urine: 1.012 (ref 1.005–1.030)
pH: 5 (ref 5.0–8.0)

## 2018-12-09 LAB — CBC
HCT: 43.7 % (ref 36.0–46.0)
Hemoglobin: 14.9 g/dL (ref 12.0–15.0)
MCH: 28.1 pg (ref 26.0–34.0)
MCHC: 34.1 g/dL (ref 30.0–36.0)
MCV: 82.3 fL (ref 80.0–100.0)
Platelets: 282 10*3/uL (ref 150–400)
RBC: 5.31 MIL/uL — ABNORMAL HIGH (ref 3.87–5.11)
RDW: 13.4 % (ref 11.5–15.5)
WBC: 10.3 10*3/uL (ref 4.0–10.5)
nRBC: 0 % (ref 0.0–0.2)

## 2018-12-09 LAB — GLUCOSE, CAPILLARY
Glucose-Capillary: 166 mg/dL — ABNORMAL HIGH (ref 70–99)
Glucose-Capillary: 266 mg/dL — ABNORMAL HIGH (ref 70–99)

## 2018-12-09 LAB — CBG MONITORING, ED
Glucose-Capillary: 383 mg/dL — ABNORMAL HIGH (ref 70–99)
Glucose-Capillary: 269 mg/dL — ABNORMAL HIGH (ref 70–99)

## 2018-12-09 MED ORDER — ONDANSETRON HCL 4 MG PO TABS: 4.0000 mg | ORAL_TABLET | Freq: Four times a day (QID) | ORAL | Status: DC | PRN

## 2018-12-09 MED ORDER — INSULIN ASPART 100 UNIT/ML ~~LOC~~ SOLN
0.0000 [IU] | Freq: Three times a day (TID) | SUBCUTANEOUS | Status: DC
  Administered 2018-12-10: 8 [IU] via SUBCUTANEOUS
  Administered 2018-12-10 (×2): 5 [IU] via SUBCUTANEOUS
  Administered 2018-12-11: 2 [IU] via SUBCUTANEOUS
  Administered 2018-12-11: 3 [IU] via SUBCUTANEOUS

## 2018-12-09 MED ORDER — SODIUM CHLORIDE 0.9 % IV BOLUS
500.0000 mL | Freq: Once | INTRAVENOUS | Status: AC
  Administered 2018-12-09: 500 mL via INTRAVENOUS

## 2018-12-09 MED ORDER — ENOXAPARIN SODIUM 40 MG/0.4ML ~~LOC~~ SOLN
40.0000 mg | SUBCUTANEOUS | Status: DC
  Administered 2018-12-09 – 2018-12-10 (×2): 40 mg via SUBCUTANEOUS
  Filled 2018-12-09 (×2): qty 0.4

## 2018-12-09 MED ORDER — LIVING WELL WITH DIABETES BOOK
Freq: Once | Status: AC
  Administered 2018-12-09: 19:00:00
  Filled 2018-12-09: qty 1

## 2018-12-09 MED ORDER — ACETAMINOPHEN 325 MG PO TABS
650.0000 mg | ORAL_TABLET | Freq: Once | ORAL | Status: AC
  Administered 2018-12-09: 650 mg via ORAL
  Filled 2018-12-09: qty 2

## 2018-12-09 MED ORDER — LACTATED RINGERS IV BOLUS
1000.0000 mL | Freq: Once | INTRAVENOUS | Status: AC
  Administered 2018-12-09: 1000 mL via INTRAVENOUS

## 2018-12-09 MED ORDER — SODIUM CHLORIDE 0.9 % IV BOLUS
1000.0000 mL | Freq: Once | INTRAVENOUS | Status: AC
  Administered 2018-12-09: 1000 mL via INTRAVENOUS

## 2018-12-09 MED ORDER — ACETAMINOPHEN 325 MG PO TABS
650.0000 mg | ORAL_TABLET | Freq: Four times a day (QID) | ORAL | Status: DC | PRN
  Administered 2018-12-10 (×3): 650 mg via ORAL
  Filled 2018-12-09 (×3): qty 2

## 2018-12-09 MED ORDER — ONDANSETRON HCL 4 MG/2ML IJ SOLN
4.0000 mg | Freq: Four times a day (QID) | INTRAMUSCULAR | Status: DC | PRN
  Administered 2018-12-10: 4 mg via INTRAVENOUS
  Filled 2018-12-09: qty 2

## 2018-12-09 MED ORDER — INSULIN ASPART 100 UNIT/ML ~~LOC~~ SOLN
10.0000 [IU] | Freq: Once | SUBCUTANEOUS | Status: AC
  Administered 2018-12-09: 10 [IU] via SUBCUTANEOUS

## 2018-12-09 MED ORDER — ACETAMINOPHEN 650 MG RE SUPP: 650.0000 mg | Freq: Four times a day (QID) | RECTAL | Status: DC | PRN

## 2018-12-09 NOTE — ED Notes (Signed)
CBG 334 

## 2018-12-09 NOTE — Plan of Care (Signed)
  Problem: Education: Goal: Knowledge of General Education information will improve Description: Including pain rating scale, medication(s)/side effects and non-pharmacologic comfort measures Outcome: Progressing   Problem: Pain Managment: Goal: General experience of comfort will improve Outcome: Progressing   Problem: Safety: Goal: Ability to remain free from injury will improve Outcome: Progressing   

## 2018-12-09 NOTE — ED Provider Notes (Signed)
[facility] EMERGENCY DEPARTMENT Provider Note   CSN: [id number] Arrival date & time: 12/09/18  1120     History   Chief Complaint Chief Complaint  Patient presents with  . Hyperglycemia    HPI Hannah Dougherty is a 48 y.o. female with a past medical history of hypertension, recently diagnosed diabetes 2 days ago with an A1c of 11.4, who presents to ED for evaluation of hyperglycemia.  States that her sugars have been running in the 500s for the past 2 days.  Patient was seen and evaluated on 12/06/2018, found to have sugar of 442, creatinine of 1.09 and hyponatremia of 128.  States that she has not been having nausea or vomiting or abdominal pain since then.  However her friend at the bedside states that "she woke up this morning and was not making any sense, I think it's because her sugars have been high."  She has not yet established care with a primary care provider but does have an appointment in about 1 month.  Reports decreased appetite and energy level.  She reports compliance with her home metformin and insulin.     HPI  Past Medical History:  Diagnosis Date  . Diabetes mellitus without complication ([place])   . Hypertension     There are no active problems to display for this patient.   Past Surgical History:  Procedure Laterality Date  . TUBAL LIGATION    . VOCAL CORD LATERALIZATION, ENDOSCOPIC APPROACH W/ MLB       OB History   No obstetric history on file.      Home Medications    Prior to Admission medications   Medication Sig Start Date End Date Taking? Authorizing Provider  hydrochlorothiazide (HYDRODIURIL) 25 MG tablet Take 1 tablet (25 mg total) by mouth daily. 12/06/18 02/04/19 Yes Joy, Shawn C, PA-C  ibuprofen (ADVIL) 200 MG tablet Take 400 mg by mouth every 6 (six) hours as needed for moderate pain.   Yes [provider]  lisinopril (ZESTRIL) 10 MG tablet Take 1 tablet (10 mg total) by mouth daily. 12/06/18 02/04/19 Yes Joy, Shawn  C, PA-C  metFORMIN (GLUCOPHAGE) 500 MG tablet Take 1 tablet (500 mg total) by mouth 2 (two) times daily. 12/06/18 02/04/19 Yes Joy, Shawn C, PA-C  ondansetron (ZOFRAN ODT) 4 MG disintegrating tablet Take 1 tablet (4 mg total) by mouth every 8 (eight) hours as needed for nausea or vomiting. 12/06/18  Yes Joy, Shawn C, PA-C  fluconazole (DIFLUCAN) 150 MG tablet Take 1 tablet (150 mg total) by mouth daily. Patient not taking: Reported on 01/19/2017 08/30/16   [name], PA-C  ibuprofen (ADVIL,MOTRIN) 600 MG tablet Take 1 tablet (600 mg total) by mouth every 8 (eight) hours as needed for moderate pain. Patient not taking: Reported on 12/09/2018 10/20/16   Dorie Rank, MD    Family History No family history on file.  Social History Social History   Tobacco Use  . Smoking status: Former Smoker    Packs/day: 1.50    Quit date: 01/14/2016    Years since quitting: 2.9  . Smokeless tobacco: Never Used  Substance Use Topics  . Alcohol use: Yes  . Drug use: Yes    Types: Marijuana     Allergies   Patient has no known allergies.   Review of Systems Review of Systems  Constitutional: Positive for activity change, appetite change and fatigue. Negative for chills and fever.  HENT: Negative for ear pain, rhinorrhea, sneezing and sore  throat.   Eyes: Negative for photophobia and visual disturbance.  Respiratory: Negative for cough, chest tightness, shortness of breath and wheezing.   Cardiovascular: Negative for chest pain and palpitations.  Gastrointestinal: Negative for abdominal pain, blood in stool, constipation, diarrhea, nausea and vomiting.  Genitourinary: Negative for dysuria, hematuria and urgency.  Musculoskeletal: Negative for myalgias.  Skin: Negative for rash.  Neurological: Negative for dizziness, weakness and light-headedness.     Physical Exam Updated Vital Signs BP (!) 154/99 (BP Location: Left Arm)   Pulse 69   Temp 98.3 F (36.8 C) (Oral)   Resp 14   LMP  10/28/2016 (Within Weeks)   SpO2 96%   Physical Exam Vitals signs and nursing note reviewed.  Constitutional:      General: She is not in acute distress.    Appearance: She is well-developed.  HENT:     Head: Normocephalic and atraumatic.     Nose: Nose normal.  Eyes:     General: No scleral icterus.       Right eye: No discharge.        Left eye: No discharge.     Conjunctiva/sclera: Conjunctivae normal.     Pupils: Pupils are equal, round, and reactive to light.  Neck:     Musculoskeletal: Normal range of motion and neck supple.  Cardiovascular:     Rate and Rhythm: Normal rate and regular rhythm.     Heart sounds: Normal heart sounds. No murmur. No friction rub. No gallop.   Pulmonary:     Effort: Pulmonary effort is normal. No respiratory distress.     Breath sounds: Normal breath sounds.  Abdominal:     General: Bowel sounds are normal. There is no distension.     Palpations: Abdomen is soft.     Tenderness: There is no abdominal tenderness. There is no guarding.  Musculoskeletal: Normal range of motion.  Skin:    General: Skin is warm and dry.     Findings: No rash.  Neurological:     General: No focal deficit present.     Mental Status: She is alert and oriented to person, place, and time.     Motor: No abnormal muscle tone.     Coordination: Coordination normal.      ED Treatments / Results  Labs (all labs ordered are listed, but only abnormal results are displayed) Labs Reviewed  BASIC METABOLIC PANEL - Abnormal; Notable for the following components:      Result Value   Sodium 123 (*)    Chloride 83 (*)    Glucose, Bld 385 (*)    BUN 30 (*)    Creatinine, Ser 2.05 (*)    Calcium 8.8 (*)    GFR calc non Af Amer 28 (*)    GFR calc Af Amer 32 (*)    All other components within normal limits  CBC - Abnormal; Notable for the following components:   RBC 5.31 (*)    All other components within normal limits  CBG MONITORING, ED - Abnormal; Notable for the  following components:   Glucose-Capillary 383 (*)    All other components within normal limits  SARS CORONAVIRUS 2 (TAT 6-24 HRS)  URINALYSIS, ROUTINE W REFLEX MICROSCOPIC  CBG MONITORING, ED  CBG MONITORING, ED    EKG None  Radiology No results found.  Procedures .Critical Care Performed by: Delia Heady, PA-C Authorized by: Delia Heady, PA-C   Critical care provider statement:    Critical care time (minutes):  25   Critical care was necessary to treat or prevent imminent or life-threatening deterioration of the following conditions:  Renal failure, endocrine crisis, circulatory failure and dehydration   Critical care was time spent personally by me on the following activities:  Development of treatment plan with patient or surrogate, discussions with consultants, evaluation of patient's response to treatment, examination of patient, review of old charts, pulse oximetry, re-evaluation of patient's condition, ordering and review of radiographic studies, ordering and review of laboratory studies and obtaining history from patient or surrogate   I assumed direction of critical care for this patient from another provider in my specialty: no     (including critical care time)  Medications Ordered in ED Medications  acetaminophen (TYLENOL) tablet 650 mg (has no administration in time range)  insulin aspart (novoLOG) injection 10 Units (has no administration in time range)  sodium chloride 0.9 % bolus 1,000 mL (0 mLs Intravenous Stopped 12/09/18 1509)  sodium chloride 0.9 % bolus 500 mL (0 mLs Intravenous Stopped 12/09/18 1509)     Initial Impression / Assessment and Plan / ED Course  I have reviewed the triage vital signs and the nursing notes.  Pertinent labs & imaging results that were available during my care of the patient were reviewed by me and considered in my medical decision making (see chart for details).  Clinical Course as of Dec 09 1547  Fri Dec 09, 2018  1237  Glucose(!): 385 [HK]  1238 Elevated from 1, noted 3 days ago.  Creatinine(!): 2.05 [HK]  1238 Sodium(!): 123 [HK]  1522 Repeat CBG after 1.5 L is 334.   [HK]    Clinical Course User Index [HK] Delia Heady, PA-C       48 year old female with a past medical history of hypertension, diabetes started on metformin 2 days ago presents to ED for hyperglycemia.  States that she has been taking her metformin as prescribed.  She initially told me that she was prescribed insulin but then said she was not.  She complains of generalized fatigue, states that her nausea, vomiting has improved.  She checked her sugar prior to arrival and states that it was 326.  On my exam patient is alert and oriented x3.  Abdomen is soft, nontender nondistended.  Her vital signs are within normal limits.  Lab work significant for continued hyponatremia and hypochloremia as well as hyperglycemia of 385.  Patient was given 1.5 L of fluids with improvement in her fatigue.  Repeat CBG is 334. Do not feel that her diabetes medication are optimal at this point. Will need to admit for worsening AKI, hyponatremia and hypochloremia in the setting of hyperglycemia.   Final Clinical Impressions(s) / ED Diagnoses   Final diagnoses:  Hyperglycemia  AKI (acute kidney injury) [facility])    ED Discharge Orders    None       Delia Heady, PA-C 12/09/18 1552    Noemi Chapel, MD 12/09/18 [phone]

## 2018-12-09 NOTE — ED Notes (Signed)
ED TO INPATIENT HANDOFF REPORT  ED Nurse Name and Phone #: William Hamburger, [place]  S Name/Age/Gender Hannah Dougherty 48 y.o. female Room/Bed: 012C/012C  Code Status   Code Status: Full Code  Home/SNF/Other Home Patient oriented to: self, place, time and situation Is this baseline? No   Triage Complete: Triage complete  Chief Complaint HIGH BLOOD SUGAR  Triage Note Pt recently diagnosed with diabetes, pt seen here 2 days ago for n/v and came back yesterday for the same but LWBS after triage. Pt drowsy in triage. Family states her blood sugar has been running in the 500s.    Allergies No Known Allergies  Level of Care/Admitting Diagnosis ED Disposition    ED Disposition Condition Comment   Admit  Hospital Area: [facility] [[id number]]  Level of Care: Med-Surg [16]  Covid Evaluation: Asymptomatic Screening Protocol (No Symptoms)  Diagnosis: Hyperglycemia [id number]  Admitting Physician: Oda Kilts [id number]  Attending Physician: Oda Kilts [id number]  PT Class (Do Not Modify): Observation [104]  PT Acc Code (Do Not Modify): Observation [10022]       B Medical/Surgery History Past Medical History:  Diagnosis Date  . Diabetes mellitus without complication ([place])   . Hypertension    Past Surgical History:  Procedure Laterality Date  . TUBAL LIGATION    . VOCAL CORD LATERALIZATION, ENDOSCOPIC APPROACH W/ MLB       A IV Location/Drains/Wounds Patient Lines/Drains/Airways Status   Active Line/Drains/Airways    Name:   Placement date:   Placement time:   Site:   Days:   Peripheral IV 12/09/18 Right Antecubital   12/09/18    1257    Antecubital   less than 1          Intake/Output Last 24 hours  Intake/Output Summary (Last 24 hours) at 12/09/2018 1746 Last data filed at 12/09/2018 1509 Gross per 24 hour  Intake 1500 ml  Output -  Net 1500 ml    Labs/Imaging Results for orders placed or performed during the hospital encounter of  12/09/18 (from the past 48 hour(s))  CBG monitoring, ED     Status: Abnormal   Collection Time: 12/09/18 11:24 AM  Result Value Ref Range   Glucose-Capillary 383 (H) 70 - 99 mg/dL  Basic metabolic panel     Status: Abnormal   Collection Time: 12/09/18 11:30 AM  Result Value Ref Range   Sodium 123 (L) 135 - 145 mmol/L   Potassium 3.6 3.5 - 5.1 mmol/L   Chloride 83 (L) 98 - 111 mmol/L   CO2 25 22 - 32 mmol/L   Glucose, Bld 385 (H) 70 - 99 mg/dL   BUN 30 (H) 6 - 20 mg/dL   Creatinine, Ser 2.05 (H) 0.44 - 1.00 mg/dL   Calcium 8.8 (L) 8.9 - 10.3 mg/dL   GFR calc non Af Amer 28 (L) >60 mL/min   GFR calc Af Amer 32 (L) >60 mL/min   Anion gap 15 5 - 15    Comment: Performed at [facility], [address] [place] [address]  CBC     Status: Abnormal   Collection Time: 12/09/18 11:30 AM  Result Value Ref Range   WBC 10.3 4.0 - 10.5 K/uL   RBC 5.31 (H) 3.87 - 5.11 MIL/uL   Hemoglobin 14.9 12.0 - 15.0 g/dL   HCT 43.7 36.0 - 46.0 %   MCV 82.3 80.0 - 100.0 fL   MCH 28.1 26.0 - 34.0 pg  MCHC 34.1 30.0 - 36.0 g/dL   RDW 13.4 11.5 - 15.5 %   Platelets 282 150 - 400 K/uL   nRBC 0.0 0.0 - 0.2 %    Comment: Performed at [facility], [place] [address], [place], [place] [address]  CBG monitoring, ED     Status: Abnormal   Collection Time: 12/09/18  5:04 PM  Result Value Ref Range   Glucose-Capillary 269 (H) 70 - 99 mg/dL   No results found.  Pending Labs Unresulted Labs (From admission, onward)    Start     Ordered   12/10/18 [id number]  Basic metabolic panel  Tomorrow morning,   R     12/09/18 1721   12/10/18 0500  CBC  Tomorrow morning,   R     12/09/18 1721   12/09/18 1720  HIV Antibody  (Routine Testing)  Once,   STAT     12/09/18 1721   12/09/18 1547  SARS CORONAVIRUS 2 (TAT 6-24 HRS) Nasopharyngeal Nasopharyngeal Swab  (Asymptomatic/Tier 2 Patients Labs)  Once,   STAT    Question Answer Comment  Is this test for diagnosis or screening Screening   Symptomatic for  COVID-19 as defined by CDC No   Hospitalized for COVID-19 No   Admitted to ICU for COVID-19 No   Previously tested for COVID-19 No   Resident in a congregate (group) care setting No   Employed in healthcare setting No   Pregnant No      12/09/18 1547   12/09/18 1127  Urinalysis, Routine w reflex microscopic  Once,   STAT     12/09/18 1126          Vitals/Pain Today's Vitals   12/09/18 1630 12/09/18 1645 12/09/18 1700 12/09/18 1707  BP: 120/90 115/64 113/74   Pulse: 65 65 63   Resp: 15 17 18    Temp:      TempSrc:      SpO2: 97% 96% 100%   PainSc:    0-No pain    Isolation Precautions No active isolations  Medications Medications  living well with diabetes book MISC (has no administration in time range)  enoxaparin (LOVENOX) injection 40 mg (has no administration in time range)  acetaminophen (TYLENOL) tablet 650 mg (has no administration in time range)    Or  acetaminophen (TYLENOL) suppository 650 mg (has no administration in time range)  ondansetron (ZOFRAN) tablet 4 mg (has no administration in time range)    Or  ondansetron (ZOFRAN) injection 4 mg (has no administration in time range)  insulin aspart (novoLOG) injection 0-15 Units (has no administration in time range)  sodium chloride 0.9 % bolus 1,000 mL (0 mLs Intravenous Stopped 12/09/18 1509)  sodium chloride 0.9 % bolus 500 mL (0 mLs Intravenous Stopped 12/09/18 1509)  acetaminophen (TYLENOL) tablet 650 mg (650 mg Oral Given 12/09/18 1554)  insulin aspart (novoLOG) injection 10 Units (10 Units Subcutaneous Given 12/09/18 1554)    Mobility walks Low fall risk   Focused Assessments Neuro Assessment Handoff:  Swallow screen pass? Yes  Cardiac Rhythm: Normal sinus rhythm       Neuro Assessment:   Neuro Checks:      Last Documented NIHSS Modified Score:   Has TPA been given? No If patient is a Neuro Trauma and patient is going to OR before floor call report to [facility] nurse: [phone] or  [phone]     R Recommendations: See Admitting Provider Note  Report given to:   Additional Notes:

## 2018-12-09 NOTE — H&P (Addendum)
Date: 12/09/2018               Patient Name:  Hannah Dougherty MRN: [id number]  DOB: 13-Feb-1971 Age / Sex: 48 y.o., female   PCP: Patient, No Pcp Per         Medical Service: Internal Medicine Teaching Service         Attending Physician: Dr. Rebeca Alert, Raynaldo Opitz, MD    First Contact: Dr. Court Joy Pager: [phone]  Second Contact: Dr. Sherry Ruffing Pager: [phone]       After Hours (After 5p/  First Contact Pager: [phone]  weekends / holidays): Second Contact Pager: [phone]   Chief Complaint: Dizziness, hyperglycemia  History of Present Illness: Hannah Dougherty is a 48 year old woman with no past medical history prior to 9/22 when she presented to ED with dizziness and vomiting. Diagnosed with DM and sent home on Metformin.Patient came back with similar symptoms today and has no PCP to follow up with. Endorsed numbness in her hands and blurry vision that is improving. Denies chest pain, headache, abdominal pain, and difficulty breathing.    Meds:  Current Meds  Medication Sig  . hydrochlorothiazide (HYDRODIURIL) 25 MG tablet Take 1 tablet (25 mg total) by mouth daily.  Marland Kitchen ibuprofen (ADVIL) 200 MG tablet Take 400 mg by mouth every 6 (six) hours as needed for moderate pain.  Marland Kitchen lisinopril (ZESTRIL) 10 MG tablet Take 1 tablet (10 mg total) by mouth daily.  . metFORMIN (GLUCOPHAGE) 500 MG tablet Take 1 tablet (500 mg total) by mouth 2 (two) times daily.  . ondansetron (ZOFRAN ODT) 4 MG disintegrating tablet Take 1 tablet (4 mg total) by mouth every 8 (eight) hours as needed for nausea or vomiting.     Allergies: Allergies as of 12/09/2018  . (No Known Allergies)   Past Medical History:  Diagnosis Date  . Diabetes mellitus without complication ([place])   . Hypertension     Family History: Grandmother had breast cancer she believes. DM, believes type 2, in her mother and father.  Social History:  Patient currently lives in hotel. 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 Smoke 1/2pack x 30  years = 15 pack years, occasional alcohol use  Review of Systems: A complete ROS was negative except as per HPI.   Physical Exam: Blood pressure 113/74, pulse 63, temperature 98.3 F (36.8 C), temperature source Oral, resp. rate 18, last menstrual period 10/28/2016, SpO2 100 %. Physical Exam Constitutional:      Appearance: Normal appearance.  HENT:     Head: Normocephalic and atraumatic.     Mouth/Throat:     Mouth: Mucous membranes are moist.     Pharynx: No oropharyngeal exudate.  Eyes:     Conjunctiva/sclera: Conjunctivae normal.  Cardiovascular:     Rate and Rhythm: Normal rate and regular rhythm.     Pulses: Normal pulses.     Heart sounds: No murmur. No friction rub. No gallop.   Pulmonary:     Effort: Pulmonary effort is normal.     Breath sounds: No wheezing, rhonchi or rales.  Musculoskeletal:     Right lower leg: No edema.     Left lower leg: No edema.  Skin:    General: Skin is warm and dry.  Neurological:     Mental Status: She is alert.  Psychiatric:        Mood and Affect: Mood normal.        Behavior: Behavior normal.      Assessment &  Plan by Problem: Active Problems:   Hyperglycemia   Hannah Dougherty is a 69 here with Type 2 DM here with hyperglycemia and AKI.   #DM2 #Hyperglycemia:  Glucose up to 446 on admission. No AG at this time. K 3.4. Recently diagnosed with diabetes, A1c on 9/22 was 11.4, started on metformin.   - SSI-M   #AKI  Cr elevated to 2.0 today on admission, baseline ~1. Possibly pre-renal given her hyperglycemia. Will give fluids and repeat BMP in AM.  -BMP -LR 1L   # Moderate Hyponatremia Na 123, BG 385, corrected NA 128. Received 1.5 L of NS in ED.  Likely hypertonic hyponatremia from hyperglycemia.    Diet: Carb modified VTE ppx : Lovenox Code: Full  Dispo: Admit patient to Observation with expected length of stay less than 2 midnights.  Signed:  Tamsen Snider, MD PGY1  [phone]

## 2018-12-09 NOTE — Progress Notes (Addendum)
Inpatient Diabetes Program Recommendations  AACE/ADA: New Consensus Statement on Inpatient Glycemic Control (2015)  Target Ranges:  Prepandial:   less than 140 mg/dL      Peak postprandial:   less than 180 mg/dL (1-2 hours)      Critically ill patients:  140 - 180 mg/dL   Lab Results  Component Value Date   GLUCAP 383 (H) 12/09/2018   HGBA1C 11.4 (H) 12/06/2018    Review of Glycemic Control Results for AVNOOR, WUNDERLE (MRN [id number]) as of 12/09/2018 12:34  Ref. Range 12/09/2018 11:30  Glucose Latest Ref Range: 70 - 99 mg/dL 385 (H)   Diabetes history: DM 2- New Diagnosis Outpatient Diabetes medications:  Current orders for Inpatient glycemic control:  Metformin 500 mg bid  Inpatient Diabetes Program Recommendations:     Note that patient is newly diagnosed with DM.  She was dx. In ED on 9/23.  Will attempt to speak to patient remotely today and order LWWD booklet.   Thanks  Adah Perl, RN, BC-ADM Inpatient Diabetes Coordinator Pager [phone] (8a-5p)  Addendum:  Spoke with pt about new diagnosis.  Discussed A1C results with him and explained what an A1C is. She plans to f/u with MD in the internal medicine clinic.  Will place order for outpatient education with Stefano Gaul in the clinic.  RNs to provide ongoing basic DM education at bedside with this patient.  Have ordered educational booklet and DM videos. Patient states MD is currently in room and she needed to go.  Will order education by bedside RN.

## 2018-12-09 NOTE — ED Notes (Signed)
Pt attempted to collect UA. Did not have the cup placed in the right area and missed collecting sample. Will attempt again later.

## 2018-12-09 NOTE — ED Triage Notes (Signed)
Pt recently diagnosed with diabetes, pt seen here 2 days ago for n/v and came back yesterday for the same but LWBS after triage. Pt drowsy in triage. Family states her blood sugar has been running in the 500s.

## 2018-12-09 NOTE — ED Notes (Signed)
Call when get back to room Tammy [phone]

## 2018-12-10 DIAGNOSIS — E871 Hypo-osmolality and hyponatremia: Secondary | ICD-10-CM

## 2018-12-10 DIAGNOSIS — E1165 Type 2 diabetes mellitus with hyperglycemia: Secondary | ICD-10-CM

## 2018-12-10 DIAGNOSIS — Z7984 Long term (current) use of oral hypoglycemic drugs: Secondary | ICD-10-CM

## 2018-12-10 DIAGNOSIS — N179 Acute kidney failure, unspecified: Principal | ICD-10-CM

## 2018-12-10 DIAGNOSIS — E861 Hypovolemia: Secondary | ICD-10-CM | POA: Diagnosis present

## 2018-12-10 LAB — HIV ANTIBODY (ROUTINE TESTING W REFLEX): HIV Screen 4th Generation wRfx: NONREACTIVE

## 2018-12-10 LAB — GLUCOSE, CAPILLARY
Glucose-Capillary: 334 mg/dL — ABNORMAL HIGH (ref 70–99)
Glucose-Capillary: 213 mg/dL — ABNORMAL HIGH (ref 70–99)
Glucose-Capillary: 210 mg/dL — ABNORMAL HIGH (ref 70–99)
Glucose-Capillary: 271 mg/dL — ABNORMAL HIGH (ref 70–99)
Glucose-Capillary: 206 mg/dL — ABNORMAL HIGH (ref 70–99)
Glucose-Capillary: 166 mg/dL — ABNORMAL HIGH (ref 70–99)

## 2018-12-10 LAB — CBC
HCT: 38.2 % (ref 36.0–46.0)
Hemoglobin: 12.7 g/dL (ref 12.0–15.0)
MCH: 27.4 pg (ref 26.0–34.0)
MCHC: 33.2 g/dL (ref 30.0–36.0)
MCV: 82.3 fL (ref 80.0–100.0)
Platelets: 253 10*3/uL (ref 150–400)
RBC: 4.64 MIL/uL (ref 3.87–5.11)
RDW: 13.4 % (ref 11.5–15.5)
WBC: 9 10*3/uL (ref 4.0–10.5)
nRBC: 0 % (ref 0.0–0.2)

## 2018-12-10 LAB — BASIC METABOLIC PANEL
Anion gap: 12 (ref 5–15)
BUN: 30 mg/dL — ABNORMAL HIGH (ref 6–20)
CO2: 23 mmol/L (ref 22–32)
Calcium: 8.3 mg/dL — ABNORMAL LOW (ref 8.9–10.3)
Chloride: 95 mmol/L — ABNORMAL LOW (ref 98–111)
Creatinine, Ser: 1.74 mg/dL — ABNORMAL HIGH (ref 0.44–1.00)
GFR calc Af Amer: 39 mL/min — ABNORMAL LOW (ref >60)
GFR calc non Af Amer: 34 mL/min — ABNORMAL LOW (ref >60)
Glucose, Bld: 209 mg/dL — ABNORMAL HIGH (ref 70–99)
Potassium: 3.4 mmol/L — ABNORMAL LOW (ref 3.5–5.1)
Sodium: 130 mmol/L — ABNORMAL LOW (ref 135–145)

## 2018-12-10 MED ORDER — POTASSIUM CHLORIDE CRYS ER 20 MEQ PO TBCR
30.0000 meq | EXTENDED_RELEASE_TABLET | Freq: Two times a day (BID) | ORAL | Status: DC
  Administered 2018-12-10 – 2018-12-11 (×3): 30 meq via ORAL
  Filled 2018-12-10 (×3): qty 1

## 2018-12-10 MED ORDER — PROMETHAZINE HCL 25 MG/ML IJ SOLN
25.0000 mg | Freq: Four times a day (QID) | INTRAMUSCULAR | Status: DC | PRN
  Administered 2018-12-10: 25 mg via INTRAVENOUS
  Filled 2018-12-10: qty 1

## 2018-12-10 MED ORDER — GLUCERNA SHAKE PO LIQD
237.0000 mL | Freq: Three times a day (TID) | ORAL | Status: DC
  Administered 2018-12-10: 237 mL via ORAL

## 2018-12-10 MED ORDER — LACTATED RINGERS IV SOLN
INTRAVENOUS | Status: AC
  Administered 2018-12-10: 17:00:00 via INTRAVENOUS

## 2018-12-10 MED ORDER — ADULT MULTIVITAMIN W/MINERALS CH
1.0000 | ORAL_TABLET | Freq: Every day | ORAL | Status: DC
  Administered 2018-12-10 – 2018-12-11 (×2): 1 via ORAL
  Filled 2018-12-10 (×2): qty 1

## 2018-12-10 NOTE — Care Management (Signed)
Consult received for PCP needs.  Contact information for [facility] and [facility] with pharmacy services added to AVS.

## 2018-12-10 NOTE — Plan of Care (Signed)
  Problem: Pain Managment: Goal: General experience of comfort will improve Outcome: Progressing   

## 2018-12-10 NOTE — Progress Notes (Signed)
Patient had elevated BP and complaining of a slight headache in the back of her head. No complaints of shortness of breath or dizziness. PRN tylenol given with slight relieve. MD paged and no new interventions at this time.   0650: Patient had a smear of blood out of rectum with a smear of BM as well. Complaints of aching abdominal page in upper abdominal. Patient also started to feel nauseous & PRN zofran given. Will continue to monitor and notify MD.

## 2018-12-10 NOTE — Progress Notes (Signed)
   Subjective: Patient reports that she was having some nausea this morning, states that the nausea medication didn't help very much. She states that she was having a headache but that has improved. Reports some mild tenderness in her abdomen.   She reports that she had some blood in her stool this morning, she states that it's happened before, every now and then. She denies any history of constipation or straining. She thinks that it may be related to her nausea and feeling like she needs to throw up.   Objective:  Vital signs in last 24 hours: Vitals:   12/09/18 2124 12/10/18 0114 12/10/18 0506 12/10/18 0529  BP: 137/65 (!) 148/89 (!) 165/90 (!) 154/77  Pulse: 63 60 63 62  Resp:      Temp: 98.7 F (37.1 C) 97.8 F (36.6 C) 98.7 F (37.1 C)   TempSrc: Oral  Oral   SpO2: 100% 99% 99% 96%   Physical Exam Constitutional:      Appearance: Normal appearance.  Cardiovascular:     Rate and Rhythm: Normal rate and regular rhythm.  Pulmonary:     Effort: Pulmonary effort is normal.     Breath sounds: Normal breath sounds. No wheezing, rhonchi or rales.     Comments: Ausculation anteriorly Abdominal:     General: Abdomen is flat. Bowel sounds are normal.  Neurological:     Mental Status: She is alert.      Assessment/Plan:  Active Problems:   Hyperglycemia  [name] Nekola is a 22 here with Type 2 DM here with hyperglycemia and AKI.   #DM2 #Hyperglycemia:  Glucose up to 446 on admission. No AG at this time. K 3.4. Recently diagnosed with diabetes, A1c on 9/22 was 11.4, started on metformin.  Patient on SSI. Continues to experience nausea this morning. Patient will need to establish with PCP , offered IMTS clinic. - SSI-M - PRN Zofran  #AKI  Cr elevated to 2.0 on admission > 1.74 with fluids , baseline ~1. Likely pre-renal given her hyperglycemia. Will need more fluids overnight.  -BMP -LR 1L   # Moderate Hyponatremia Na 130, BG 200. Likely hypertonic hyponatremia from  hyperglycemia. Will continue to monitor   Diet: Carb modified VTE [id number] Code: Full   Dispo: Anticipated discharge in approximately 0-1 days.   Tamsen Snider, MD PGY1  [phone]

## 2018-12-10 NOTE — Progress Notes (Signed)
Initial Nutrition Assessment  DOCUMENTATION CODES:   Obesity unspecified  INTERVENTION:  -Glucerna Shake po TID, each supplement provides 220 kcal and 10 grams of protein -MVI with minerals daily -Education provided within discharge instructions   NUTRITION DIAGNOSIS:   Altered nutrition lab value related to chronic illness as evidenced by other (comment)(A1C 11.8).   GOAL:   Other (Comment)(Patient will impliment dietary recommendations to reduce A1C)   MONITOR:   Weight trends, PO intake, Labs, Supplement acceptance, I & O's  REASON FOR ASSESSMENT:   Malnutrition Screening Tool    ASSESSMENT:  RD working remotely.  48 year old female with no past medical history prior to 9/22 when she presented to ED with dizziness and vomiting. Patient diagnosed with DM; sent home on Metformin. Patient without PCP and returned to ED with similar symptoms, endorses numbness in hands and blurry vision.  Patient admitted with hyperglycemia and AKI  Unable to contact patient today via phone to obtain nutrition history. Per chart review, pt plans to follow up with MD in internal medicine clinic. Outpatient education with Stefano Gaul in the clinic order placed by Diabetes Coordinator per chart review.   This morning RN noted smear of blood out of rectum as well as smear in BM. Patient complaining of upper abdominal pain and endorsing nausea; MD notified.   Eating 100% of meals at this time. Will order Glucerna daily to assist with calorie and protein needs. Provide patient with "Heart Healthy Consistent Carbohydrate Nutrition Therapy" handout from [employer]. Education attached to discharge instructions.   Current wt 98.9 kg (217.6 lb) Weight history reviewed: Stable 216.5 - 237.6 lb from 2015-2020  Medications reviewed and include: SSI, K-Dur Received PRN phenergan today  Labs: [facility] 213-271 Potassium 3.4 - replacing Lab Results  Component Value Date   HGBA1C  11.4 (H) 12/06/2018    NUTRITION - FOCUSED PHYSICAL EXAM: Unable to complete at this time; RD working remotely.   Diet Order:   Diet Order            Diet Carb Modified Fluid consistency: Thin; Room service appropriate? Yes  Diet effective now              EDUCATION NEEDS:   Education needs have been addressed  Skin:  Skin Assessment: Reviewed RN Assessment  Last BM:  9/25  Height:   Ht Readings from Last 1 Encounters:  12/08/18 5\' 5"  (1.651 m)    Weight:   Wt Readings from Last 1 Encounters:  12/08/18 98.9 kg    Ideal Body Weight:  56.8 kg  BMI:  There is no height or weight on file to calculate BMI.  Estimated Nutritional Needs:   Kcal:  [id number]  Protein:  89-97  Fluid:  >/=1.7 L/day   Lajuan Lines, RD, LDN [facility] Telephone [phone] After Hours/Weekend Pager: [phone]

## 2018-12-11 DIAGNOSIS — Z794 Long term (current) use of insulin: Secondary | ICD-10-CM

## 2018-12-11 LAB — CBC
HCT: 38.9 % (ref 36.0–46.0)
Hemoglobin: 13.1 g/dL (ref 12.0–15.0)
MCH: 27.6 pg (ref 26.0–34.0)
MCHC: 33.7 g/dL (ref 30.0–36.0)
MCV: 81.9 fL (ref 80.0–100.0)
Platelets: 249 10*3/uL (ref 150–400)
RBC: 4.75 MIL/uL (ref 3.87–5.11)
RDW: 13.2 % (ref 11.5–15.5)
WBC: 7.5 10*3/uL (ref 4.0–10.5)
nRBC: 0 % (ref 0.0–0.2)

## 2018-12-11 LAB — BASIC METABOLIC PANEL
Anion gap: 13 (ref 5–15)
BUN: 16 mg/dL (ref 6–20)
CO2: 24 mmol/L (ref 22–32)
Calcium: 8.8 mg/dL — ABNORMAL LOW (ref 8.9–10.3)
Chloride: 93 mmol/L — ABNORMAL LOW (ref 98–111)
Creatinine, Ser: 1.17 mg/dL — ABNORMAL HIGH (ref 0.44–1.00)
GFR calc Af Amer: >60 mL/min (ref >60)
GFR calc non Af Amer: 54 mL/min — ABNORMAL LOW (ref >60)
Glucose, Bld: 166 mg/dL — ABNORMAL HIGH (ref 70–99)
Potassium: 3.9 mmol/L (ref 3.5–5.1)
Sodium: 130 mmol/L — ABNORMAL LOW (ref 135–145)

## 2018-12-11 LAB — GLUCOSE, CAPILLARY
Glucose-Capillary: 149 mg/dL — ABNORMAL HIGH (ref 70–99)
Glucose-Capillary: 175 mg/dL — ABNORMAL HIGH (ref 70–99)

## 2018-12-11 MED ORDER — RELION CONFIRM GLUCOSE MONITOR W/DEVICE KIT: 1.0000 | PACK | Freq: Once | 0 refills | Status: AC

## 2018-12-11 MED ORDER — RELION CONFIRM/MICRO TEST VI STRP: ORAL_STRIP | 0 refills | Status: DC

## 2018-12-11 MED ORDER — LANTUS SOLOSTAR 100 UNIT/ML ~~LOC~~ SOPN: 10.0000 [IU] | PEN_INJECTOR | Freq: Every day | SUBCUTANEOUS | 0 refills | Status: DC

## 2018-12-11 MED ORDER — RELION LANCET DEVICES 30G MISC: 1.0000 | Freq: Three times a day (TID) | 0 refills | Status: DC

## 2018-12-11 NOTE — Discharge Instructions (Signed)
Hannah Dougherty,   It has been a pleasure working with you and we are glad you're feeling better. You were hospitalized for elevated blood sugar levels and kidney damage. You were treated with insulin, IV fluids, and nausea medications. Your blood sugars have been looking better. You will need to start taking insulin daily for now, and then when you follow up in the clinic we can discuss switching you to different medications.   For your diabetes,  START taking insulin glargine (Lantus), 10 units daily Continue taking metformin 500 mg twice a day Please start checking your blood sugars up to 3 times per day and record the numbers. I have sent in a prescription for the monitor and the test strips if needed.   Follow up with your primary care provider in 1 week  If your symptoms worsen or you develop new symptoms, please seek medical help whether it is your primary care provider or emergency department.  If you have any questions about this hospitalization please call [phone].    Heart Healthy, Consistent Carbohydrate Nutrition Therapy   A heart-healthy and consistent carbohydrate diet is recommended to manage heart disease and diabetes. To follow a heart-healthy and consistent carbohydrate diet,  Eat a balanced diet with whole grains, fruits and vegetables, and lean protein sources.   Choose heart-healthy unsaturated fats. Limit saturated fats, trans fats, and cholesterol intake. Eat more plant-based or vegetarian meals using beans and soy foods for protein.   Eat whole, unprocessed foods to limit the amount of sodium (salt) you eat.   Choose a consistent amount of carbohydrate at each meal and snack. Limit refined carbohydrates especially sugar, sweets and sugar-sweetened beverages.   If you drink alcohol, do so in moderation: one serving per day (women) and two servings per day (men). o One serving is equivalent to 12 ounces beer, 5 ounces wine, or 1.5 ounces distilled  spirits  Tips Tips for Choosing Heart-Healthy Fats Choose lean protein and low-fat dairy foods to reduce saturated fat intake.  Saturated fat is usually found in animal-based protein and is associated with certain health risks. Saturated fat is the biggest contributor to raise low-density lipoprotein (LDL) cholesterol levels. Research shows that limiting saturated fat lowers unhealthy cholesterol levels. Eat no more than 7% of your total calories each day from saturated fat. Ask your RDN to help you determine how much saturated fat is right for you.  There are many foods that do not contain large amounts of saturated fats. Swapping these foods to replace foods high in saturated fats will help you limit the saturated fat you eat and improve your cholesterol levels. You can also try eating more plant-based or vegetarian meals. Instead of Try:  Whole milk, cheese, yogurt, and ice cream 1% or skim milk, low-fat cheese, non-fat yogurt, and low-fat ice cream  Fatty, marbled beef and pork Lean beef, pork, or venison  Poultry with skin Poultry without skin  Butter, stick margarine Reduced-fat, whipped, or liquid spreads  Coconut oil, palm oil Liquid vegetable oils: corn, canola, olive, soybean and safflower oils   Avoid foods that contain trans fats.  Trans fats increase levels of LDL-cholesterol. Hydrogenated fat in processed foods is the main source of trans fats in foods.   Trans fats can be found in stick margarine, shortening, processed sweets, baked goods, some fried foods, and packaged foods made with hydrogenated oils. Avoid foods with partially hydrogenated oil on the ingredient list such as: cookies, pastries, baked goods, biscuits, crackers, microwave popcorn,  and frozen dinners. Choose foods with heart healthy fats.  Polyunsaturated and monounsaturated fat are unsaturated fats that may help lower your blood cholesterol level when used in place of saturated fat in your diet.  Ask your  RDN about taking a dietary supplement with plant sterols and stanols to help lower your cholesterol level.  Research shows that substituting saturated fats with unsaturated fats is beneficial to cholesterol levels. Try these easy swaps: Instead of Try:  Butter, stick margarine, or solid shortening Reduced-fat, whipped, or liquid spreads  Beef, pork, or poultry with skin Fish and seafood  Chips, crackers, snack foods Raw or unsalted nuts and seeds or nut butters Hummus with vegetables Avocado on toast  Coconut oil, palm oil Liquid vegetable oils: corn, canola, olive, soybean and safflower oils  Limit the amount of cholesterol you eat to less than 200 milligrams per day.  Cholesterol is a substance carried through the bloodstream via lipoproteins, which are known as transporters of fat. Some body functions need cholesterol to work properly, but too much cholesterol in the bloodstream can damage arteries and build up blood vessel linings (which can lead to heart attack and stroke). You should eat less than 200 milligrams cholesterol per day.  People respond differently to eating cholesterol. There is no test available right now that can figure out which people will respond more to dietary cholesterol and which will respond less. For individuals with high intake of dietary cholesterol, different types of increase (none, small, moderate, large) in LDL-cholesterol levels are all possible.   Food sources of cholesterol include egg yolks and organ meats such as liver, gizzards. Limit egg yolks to two to four per week and avoid organ meats like liver and gizzards to control cholesterol intake. Tips for Choosing Heart-Healthy Carbohydrates Consume a consistent amount of carbohydrate  It is important to eat foods with carbohydrates in moderation because they impact your blood glucose level. Carbohydrates can be found in many foods such as:  Grains (breads, crackers, rice, pasta, and cereals)    Starchy Vegetables (potatoes, corn, and peas)   Beans and legumes   Milk, soy milk, and yogurt   Fruit and fruit juice   Sweets (cakes, cookies, ice cream, jam and jelly)  Your RDN will help you set a goal for how many carbohydrate servings to eat at your meals and snacks. For many adults, eating 3 to 5 servings of carbohydrate foods at each meal and 1 or 2 carbohydrate servings for each snack works well.   Check your blood glucose level regularly. It can tell you if you need to adjust when you eat carbohydrates.  Choose foods rich in viscous (soluble) fiber  Viscous, or soluble, is found in the walls of plant cells. Viscous fiber is found only in plant-based foods. Eating foods with fiber helps to lower your unhealthy cholesterol and keep your blood glucose in range   Rich sources of viscous fiber include vegetables (asparagus, Brussels sprouts, sweet potatoes, turnips) fruit (apricots, mangoes, oranges), legumes, and whole grains (barley, oats, and oat bran).   As you increase your fiber intake gradually, also increase the amount of water you drink. This will help prevent constipation.   If you have difficulty achieving this goal, ask your RDN about fiber laxatives. Choose fiber supplements made with viscous fibers such as psyllium seed husks or methylcellulose to help lower unhealthy cholesterol.   Limit refined carbohydrates   There are three types of carbohydrates: starches, sugar, and fiber. Some carbohydrates occur naturally  in food, like the starches in rice or corn or the sugars in fruits and milk. Refined carbohydrates--foods with high amounts of simple sugars--can raise triglyceride levels. High triglyceride levels are associated with coronary heart disease.  Some examples of refined carbohydrate foods are table sugar, sweets, and beverages sweetened with added sugar. Tips for Reducing Sodium (Salt) Although sodium is important for your body to function, too much sodium  can be harmful for people with high blood pressure. As sodium and fluid buildup in your tissues and bloodstream, your blood pressure increases. High blood pressure may cause damage to other organs and increase your risk for a stroke. Even if you take a pill for blood pressure or a water pill (diuretic) to remove fluid, it is still important to have less salt in your diet. Ask your doctor and RDN what amount of sodium is right for you.  Avoid processed foods. Eat more fresh foods.   Fresh fruits and vegetables are naturally low in sodium, as well as frozen vegetables and fruits that have no added juices or sauces.   Fresh meats are lower in sodium than processed meats, such as bacon, sausage, and hotdogs. Read the nutrition label or ask your butcher to help you find a fresh meat that is low in sodium.  Eat less salt--at the table and when cooking.   A single teaspoon of table salt has 2,300 mg of sodium.   Leave the salt out of recipes for pasta, casseroles, and soups.   Ask your RDN how to cook your favorite recipes without sodium  Be a smart shopper.   Look for food packages that say salt-free or sodium-free. These items contain less than 5 milligrams of sodium per serving.   Very low-sodium products contain less than 35 milligrams of sodium per serving.   Low-sodium products contain less than 140 milligrams of sodium per serving.   Beware for Unsalted or No Added Salt products. These items may still be high in sodium. Check the nutrition label.  Add flavors to your food without adding sodium.   Try lemon juice, lime juice, fruit juice or vinegar.   Dry or fresh herbs add flavor. Try basil, bay leaf, dill, rosemary, parsley, sage, dry mustard, nutmeg, thyme, and paprika.   Pepper, red pepper flakes, and cayenne pepper can add spice t your meals without adding sodium. Hot sauce contains sodium, but if you use just a drop or two, it will not add up to much.   Buy a  sodium-free seasoning blend or make your own at home. Additional Lifestyle Tips Achieve and maintain a healthy weight.  Talk with your RDN or your doctor about what is a healthy weight for you.  Set goals to reach and maintain that weight.   To lose weight, reduce your calorie intake along with increasing your physical activity. A weight loss of 10 to 15 pounds could reduce LDL-cholesterol by 5 milligrams per deciliter. Participate in physical activity.  Talk with your health care team to find out what types of physical activity are best for you. Set a plan to get about 30 minutes of exercise on most days.  Foods Recommended Food Group Foods Recommended  Grains Whole grain breads and cereals, including whole wheat, barley, rye, buckwheat, corn, teff, quinoa, millet, amaranth, brown or wild rice, sorghum, and oats Pasta, especially whole wheat or other whole grain types  [employer], quinoa or wild rice Whole grain crackers, bread, rolls, pitas Home-made bread with reduced-sodium baking soda  Protein Foods Lean cuts of beef and pork (loin, leg, round, extra lean hamburger)  Skinless Cytogeneticist and other wild game Dried beans and peas Nuts and nut butters Meat alternatives made with soy or textured vegetable protein  Egg whites or egg substitute Cold cuts made with lean meat or soy protein  Dairy Nonfat (skim), low-fat, or 1%-fat milk  Nonfat or low-fat yogurt or cottage cheese Fat-free and low-fat cheese  Vegetables Fresh, frozen, or canned vegetables without added fat or salt   Fruits Fresh, frozen, canned, or dried fruit   Oils Unsaturated oils (corn, olive, peanut, soy, sunflower, canola)  Soft or liquid margarines and vegetable oil spreads  Salad dressings Seeds and nuts  Avocado   Foods Not Recommended Food Group Foods Not Recommended  Grains Breads or crackers topped with salt Cereals (hot or cold) with more than 300 mg sodium per serving Biscuits, cornbread,  and other quick breads prepared with baking soda Bread crumbs or stuffing mix from a store High-fat bakery products, such as doughnuts, biscuits, croissants, danish pastries, pies, cookies Instant cooking foods to which you add hot water and stir--potatoes, noodles, rice, etc. Packaged starchy foods--seasoned noodle or rice dishes, stuffing mix, macaroni and cheese dinner Snacks made with partially hydrogenated oils, including chips, cheese puffs, snack mixes, regular crackers, butter-flavored popcorn  Protein Foods Higher-fat cuts of meats (ribs, t-bone steak, regular hamburger) Bacon, sausage, or hot dogs Cold cuts, such as salami or bologna, deli meats, cured meats, corned beef Organ meats (liver, brains, gizzards, sweetbreads) Poultry with skin Fried or smoked meat, poultry, and fish Whole eggs and egg yolks (more than 2-4 per week) Salted legumes, nuts, seeds, or nut/seed butters Meat alternatives with high levels of sodium (>300 mg per serving) or saturated fat (>5 g per serving)  Dairy Whole milk,?2% fat milk, buttermilk Whole milk yogurt or ice cream Cream Half-&-half Cream cheese Sour cream Cheese  Vegetables Canned or frozen vegetables with salt, fresh vegetables prepared with salt, butter, cheese, or cream sauce Fried vegetables Pickled vegetables such as olives, pickles, or sauerkraut  Fruits Fried fruits Fruits served with butter or cream  Oils Butter, stick margarine, shortening Partially hydrogenated oils or trans fats Tropical oils (coconut, palm, palm kernel oils)  Other Candy, sugar sweetened soft drinks and desserts Salt, sea salt, garlic salt, and seasoning mixes containing salt Bouillon cubes Ketchup, barbecue sauce, Worcestershire sauce, soy sauce, teriyaki sauce Miso Salsa Pickles, olives, relish   Heart Healthy Consistent Carbohydrate Vegetarian (Lacto-Ovo) Sample 1-Day Menu  Breakfast 1 cup oatmeal, cooked (2 carbohydrate servings)   cup blueberries  (1 carbohydrate serving)  11 almonds, without salt  1 cup 1% milk (1 carbohydrate serving)  1 cup coffee  Morning Snack 1 cup fat-free plain yogurt (1 carbohydrate serving)  Lunch 1 whole wheat bun (1 carbohydrate servings)  1 black bean burger (1 carbohydrate servings)  1 slice cheddar cheese, low sodium  2 slices tomatoes  2 leaves lettuce  1 teaspoon mustard  1 small pear (1 carbohydrate servings)  1 cup green tea, unsweetened  Afternoon Snack 1/3 cup trail mix with nuts, seeds, and raisins, without salt (1 carbohydrate servinga)  Evening Meal  cup meatless chicken  2/3 cup brown rice, cooked (2 carbohydrate servings)  1 cup broccoli, cooked (2/3 carbohydrate serving)   cup carrots, cooked (1/3 carbohydrate serving)  2 teaspoons olive oil  1 teaspoon balsamic vinegar  1 whole wheat dinner roll (1 carbohydrate serving)  1 teaspoon margarine, soft, tub  1 cup 1% milk (1 carbohydrate serving)  Evening Snack 1 extra small banana (1 carbohydrate serving)  1 tablespoon peanut butter   Heart Healthy Consistent Carbohydrate Vegan Sample 1-Day Menu  Breakfast 1 cup oatmeal, cooked (2 carbohydrate servings)   cup blueberries (1 carbohydrate serving)  11 almonds, without salt  1 cup soymilk fortified with calcium, vitamin B12, and vitamin D  1 cup coffee  Morning Snack 6 ounces soy yogurt (1 carbohydrate servings)  Lunch 1 whole wheat bun(1 carbohydrate servings)  1 black bean burger (1 carbohydrate serving)  2 slices tomatoes  2 leaves lettuce  1 teaspoon mustard  1 small pear (1 carbohydrate servings)  1 cup green tea, unsweetened  Afternoon Snack 1/3 cup trail mix with nuts, seeds, and raisins, without salt (1 carbohydrate servings)  Evening Meal  cup meatless chicken  2/3 cup brown rice, cooked (2 carbohydrate servings)  1 cup broccoli, cooked (2/3 carbohydrate serving)   cup carrots, cooked (1/3 carbohydrate serving)  2 teaspoons olive oil  1 teaspoon balsamic  vinegar  1 whole wheat dinner roll (1 carbohydrate serving)  1 teaspoon margarine, soft, tub  1 cup soymilk fortified with calcium, vitamin B12, and vitamin D  Evening Snack 1 extra small banana (1 carbohydrate serving)  1 tablespoon peanut butter    Heart Healthy Consistent Carbohydrate Sample 1-Day Menu  Breakfast 1 cup cooked oatmeal (2 carbohydrate servings)  3/4 cup blueberries (1 carbohydrate serving)  1 ounce almonds  1 cup skim milk (1 carbohydrate serving)  1 cup coffee  Morning Snack 1 cup sugar-free nonfat yogurt (1 carbohydrate serving)  Lunch 2 slices whole-wheat bread (2 carbohydrate servings)  2 ounces lean [place] breast  1 ounce low-fat Swiss cheese  1 teaspoon mustard  1 slice tomato  1 lettuce leaf  1 small pear (1 carbohydrate serving)  1 cup skim milk (1 carbohydrate serving)  Afternoon Snack 1 ounce trail mix with unsalted nuts, seeds, and raisins (1 carbohydrate serving)  Evening Meal 3 ounces salmon  2/3 cup cooked brown rice (2 carbohydrate servings)  1 teaspoon soft margarine  1 cup cooked broccoli with 1/2 cup cooked carrots (1 carbohydrate serving  Carrots, cooked, boiled, drained, without salt  1 cup lettuce  1 teaspoon olive oil with vinegar for dressing  1 small whole grain roll (1 carbohydrate serving)  1 teaspoon soft margarine  1 cup unsweetened tea  Evening Snack 1 extra-small banana (1 carbohydrate serving)  Copyright 2020  [employer]. All rights reserved.

## 2018-12-11 NOTE — Discharge Summary (Signed)
Name: Hannah Dougherty MRN: [id number] DOB: [birth year] 48 y.o. PCP: Patient, No Pcp Per  Date of Admission: 12/09/2018 11:23 AM Date of Discharge: 12/11/2018 12/11/2018  1:34 PM Attending Physician: Oda Kilts  Discharge Diagnosis: 1. Diabetes mellitus, hyperglycemia 2. AKI 3. Hyponatremia  Discharge Medications: Allergies as of 12/11/2018   No Known Allergies     Medication List    STOP taking these medications   fluconazole 150 MG tablet Commonly known as: Diflucan     TAKE these medications   hydrochlorothiazide 25 MG tablet Commonly known as: HYDRODIURIL Take 1 tablet (25 mg total) by mouth daily.   ibuprofen 200 MG tablet Commonly known as: ADVIL Take 400 mg by mouth every 6 (six) hours as needed for moderate pain.   ibuprofen 600 MG tablet Commonly known as: ADVIL Take 1 tablet (600 mg total) by mouth every 8 (eight) hours as needed for moderate pain.   Lantus SoloStar 100 UNIT/ML Solostar Pen Generic drug: Insulin Glargine Inject 10 Units into the skin daily at 10 pm.   lisinopril 10 MG tablet Commonly known as: ZESTRIL Take 1 tablet (10 mg total) by mouth daily.   metFORMIN 500 MG tablet Commonly known as: GLUCOPHAGE Take 1 tablet (500 mg total) by mouth 2 (two) times daily.   ondansetron 4 MG disintegrating tablet Commonly known as: Zofran ODT Take 1 tablet (4 mg total) by mouth every 8 (eight) hours as needed for nausea or vomiting.   ReliOn Confirm/micro Test test strip Generic drug: glucose blood Use as instructed   ReliOn Lancet Devices 30G Misc 1 each by Does not apply route 3 (three) times daily.     ASK your doctor about these medications   ReliOn Confirm Glucose Monitor w/Device Kit 1 each by Does not apply route once for 1 dose. Ask about: Should I take this medication?       Disposition and follow-up:   Hannah Dougherty was discharged from [facility] in Stable condition.  At the hospital follow up visit  please address:  1.  Diabetes mellitus/hyperglycemia: Patient started on lantus 10 units daily on discharge, first time starting insulin, advised to monitor blood sugars  AKI: Cr improved to 1.1 from 2, please repeat BMP  2.  Labs / imaging needed at time of follow-up: BMP  3.  Pending labs/ test needing follow-up: None  Follow-up Appointments: Follow-up Information    [place]. Schedule an appointment as soon as possible for a visit in 1 week(s).   Contact information: [address] [place] [facility] Course by problem list: 1. Diabetes mellitus/hyperglycemia: This is a 48 year old female with no significant past medical history who presented with dizziness, vomiting, and blurry vision, had been recently diagnosed with diabetes during ED visit 2 days prior to admission, with an A1c of 11.4. She was started on metformin however she continued to have symptoms. On admission her glucose was elevated to 446, no anion gap noted, and was found to be hypokalemic to 3.4. No ketonuria or glucosuria. Patient was started on a sliding scale and CBGs were better controlled.  She continued to have some nausea and vomiting which was treated with antiemetics which resolved during hospitalization.  Patient was started on Lantus 10 units daily at discharge and continued on her metformin.  Will need hospital follow-up in IM TS.  2. AKI: Creatinine elevated to 2.0 on admission, baseline  around 1, patient was given fluids and creatinine improved to 1.1.  Likely prerenal.  3.  Hyponatremia: Sodium 123, glucose 85,'s corrected sodium 128. Patient was given normal saline in the ED and sodium improved to 131 on day of discharge.  Discharge Vitals:   BP (!) 163/90 (BP Location: Left Arm)   Pulse 67   Temp 98.3 F (36.8 C) (Oral)   Resp 16   LMP 10/28/2016 (Within Weeks)   SpO2 100%   Pertinent Labs, Studies, and Procedures:  BMP  Latest Ref Rng & Units 12/12/2018 12/11/2018 12/10/2018  Glucose 70 - 99 mg/dL 316(H) 166(H) 209(H)  BUN 6 - 20 mg/dL 18 16 30(H)  Creatinine 0.44 - 1.00 mg/dL 1.49(H) 1.17(H) 1.74(H)  Sodium 135 - 145 mmol/L 131(L) 130(L) 130(L)  Potassium 3.5 - 5.1 mmol/L 3.8 3.9 3.4(L)  Chloride 98 - 111 mmol/L 92(L) 93(L) 95(L)  CO2 22 - 32 mmol/L _0 Calcium 8.9 - 10.3 mg/dL 9.0 8.8(L) 8.3(L)   CBC Latest Ref Rng & Units 12/12/2018 12/11/2018 12/10/2018  WBC 4.0 - 10.5 K/uL 9.5 7.5 9.0  Hemoglobin 12.0 - 15.0 g/dL 14.5 13.1 12.7  Hematocrit 36.0 - 46.0 % 44.5 38.9 38.2  Platelets 150 - 400 K/uL 302 249 253     Discharge Instructions: Discharge Instructions    Ambulatory referral to Nutrition and Diabetic Education   Complete by: As directed    New diagnosis of DM- A1C=11.8%   Call MD for:  difficulty breathing, headache or visual disturbances   Complete by: As directed    Call MD for:  extreme fatigue   Complete by: As directed    Call MD for:  hives   Complete by: As directed    Call MD for:  persistant dizziness or light-headedness   Complete by: As directed    Call MD for:  persistant nausea and vomiting   Complete by: As directed    Call MD for:  redness, tenderness, or signs of infection (pain, swelling, redness, odor or green/yellow discharge around incision site)   Complete by: As directed    Call MD for:  severe uncontrolled pain   Complete by: As directed    Call MD for:  temperature >100.4   Complete by: As directed    Diet - low sodium heart healthy   Complete by: As directed    Increase activity slowly   Complete by: As directed       Signed: Asencion Noble, MD 12/13/2018, 4:55 AM   Pager: [phone]

## 2018-12-11 NOTE — Progress Notes (Signed)
   Subjective: Patient reports that she is doing a lot better today, denies any nausea, vomiting, abdominal pain, lightheadedness, dizziness, chest pain, or other symptoms. She got 1 dose of Phenergan last night that she reports helped her nausea significantly.  We discussed that we will be starting her on insulin when she is discharged and we can have her follow-up in our clinic to manage her diabetes.  She is in agreement with this plan.  Objective:  Vital signs in last 24 hours: Vitals:   12/10/18 0814 12/10/18 1533 12/10/18 2036 12/11/18 0403  BP: (!) 186/71 (!) 168/76 128/81 108/72  Pulse: (!) 57 64 66 66  Resp: 14 14    Temp: 98.2 F (36.8 C) 99.3 F (37.4 C) 98.4 F (36.9 C) 98 F (36.7 C)  TempSrc: Oral Oral Oral Oral  SpO2: 98% 97% 100% 99%   General: Middle aged female, NAD, laying in bed Cardiac: RRR, no m/r/g Pulmonary: CTABL, no wheezing, rhonci, rales Neuro: A+Ox3, moves all extremities  Assessment/Plan:  Principal Problem:   AKI (acute kidney injury) ([place]) Active Problems:   Hyperglycemia   Hypovolemia   Hyponatremia  [name] Seedorf is a 51 here with recently diagnosed type 2 DM presented with hyperglycemia and AKI.   #DM2 #Hyperglycemia: Glucose up to 446 on admission. No AG at this time. K 3.4. Recently diagnosed with diabetes, A1c on 9/22 was 11.4, started on metformin.   Patient was having nausea yesterday requiring IV antiemetics. Has symptomatic improvement today.  Doing well on sliding scale insulin, received 18 units over the past 24 hours.  Will need to start her on insulin on discharge, will need follow-up with IM TS clinic for further management.  - SSI-M - PRN Zofran -Discharge today with insulin prescription, Lantus 10 units daily -Follow-up with IM TS clinic this week, may need to start GLP-1 agonist or SGLT2 inhibitor  #AKI Cr elevated to 2.0 on admission >1.1 , baseline ~1. Likely pre-renal given her hyperglycemia.   # Moderate  Hyponatremia Na 130, BG 200. Likely hypertonic hyponatremia from hyperglycemia. Will continue to monitor   Diet: Carb modified VTE [id number] Code: Full  Dispo: Anticipated discharge in approximately today.   Asencion Noble, M.D. PGY2 Pager [phone] 12/11/2018 12:08 PM

## 2018-12-12 ENCOUNTER — Other Ambulatory Visit: Payer: Self-pay

## 2018-12-12 ENCOUNTER — Encounter (HOSPITAL_COMMUNITY): Payer: Self-pay

## 2018-12-12 ENCOUNTER — Emergency Department (HOSPITAL_COMMUNITY)
Admission: EM | Admit: 2018-12-12 | Discharge: 2018-12-12 | Disposition: A | Discharge status: Home / Self Care | Payer: Self-pay | Attending: Emergency Medicine | Admitting: Emergency Medicine

## 2018-12-12 DIAGNOSIS — Z794 Long term (current) use of insulin: Secondary | ICD-10-CM | POA: Insufficient documentation

## 2018-12-12 DIAGNOSIS — Z87891 Personal history of nicotine dependence: Secondary | ICD-10-CM | POA: Insufficient documentation

## 2018-12-12 DIAGNOSIS — I10 Essential (primary) hypertension: Secondary | ICD-10-CM | POA: Insufficient documentation

## 2018-12-12 DIAGNOSIS — R739 Hyperglycemia, unspecified: Secondary | ICD-10-CM

## 2018-12-12 DIAGNOSIS — E1165 Type 2 diabetes mellitus with hyperglycemia: Secondary | ICD-10-CM | POA: Insufficient documentation

## 2018-12-12 DIAGNOSIS — Z79899 Other long term (current) drug therapy: Secondary | ICD-10-CM | POA: Insufficient documentation

## 2018-12-12 LAB — BASIC METABOLIC PANEL
Anion gap: 11 (ref 5–15)
BUN: 18 mg/dL (ref 6–20)
CO2: 28 mmol/L (ref 22–32)
Calcium: 9 mg/dL (ref 8.9–10.3)
Chloride: 92 mmol/L — ABNORMAL LOW (ref 98–111)
Creatinine, Ser: 1.49 mg/dL — ABNORMAL HIGH (ref 0.44–1.00)
GFR calc Af Amer: 47 mL/min — ABNORMAL LOW (ref >60)
GFR calc non Af Amer: 41 mL/min — ABNORMAL LOW (ref >60)
Glucose, Bld: 316 mg/dL — ABNORMAL HIGH (ref 70–99)
Potassium: 3.8 mmol/L (ref 3.5–5.1)
Sodium: 131 mmol/L — ABNORMAL LOW (ref 135–145)

## 2018-12-12 LAB — CBC
HCT: 44.5 % (ref 36.0–46.0)
Hemoglobin: 14.5 g/dL (ref 12.0–15.0)
MCH: 27.6 pg (ref 26.0–34.0)
MCHC: 32.6 g/dL (ref 30.0–36.0)
MCV: 84.8 fL (ref 80.0–100.0)
Platelets: 302 10*3/uL (ref 150–400)
RBC: 5.25 MIL/uL — ABNORMAL HIGH (ref 3.87–5.11)
RDW: 13.7 % (ref 11.5–15.5)
WBC: 9.5 10*3/uL (ref 4.0–10.5)
nRBC: 0 % (ref 0.0–0.2)

## 2018-12-12 LAB — URINALYSIS, ROUTINE W REFLEX MICROSCOPIC
Bilirubin Urine: NEGATIVE
Glucose, UA: 50 mg/dL — AB
Hgb urine dipstick: NEGATIVE
Ketones, ur: NEGATIVE mg/dL
Nitrite: NEGATIVE
Protein, ur: NEGATIVE mg/dL
Specific Gravity, Urine: 1.018 (ref 1.005–1.030)
pH: 5 (ref 5.0–8.0)

## 2018-12-12 LAB — CBG MONITORING, ED
Glucose-Capillary: 304 mg/dL — ABNORMAL HIGH (ref 70–99)
Glucose-Capillary: 148 mg/dL — ABNORMAL HIGH (ref 70–99)

## 2018-12-12 MED ORDER — SODIUM CHLORIDE 0.9 % IV BOLUS
1000.0000 mL | Freq: Once | INTRAVENOUS | Status: AC
  Administered 2018-12-12: 1000 mL via INTRAVENOUS

## 2018-12-12 MED ORDER — INSULIN ASPART 100 UNIT/ML ~~LOC~~ SOLN
5.0000 [IU] | Freq: Once | SUBCUTANEOUS | Status: AC
  Administered 2018-12-12: 15:00:00 5 [IU] via INTRAVENOUS

## 2018-12-12 MED ORDER — SODIUM CHLORIDE 0.9 % IV BOLUS: 500.0000 mL | Freq: Once | INTRAVENOUS | Status: DC

## 2018-12-12 NOTE — Discharge Instructions (Addendum)
You were seen in the emergency department today for high blood sugars.  Your labs show that your blood sugars were elevated and your kidney function was a bit elevated as well.  Your sugar improved after fluids and insulin in the emergency department.  We called and spoke with the internal medicine service, they have provided the following recommendations:  Continue taking Metformin as previously prescribed.   Start taking 10 units of insulin (novolin) in the morning and 8 units of insulin (novolin) in the evening. You can start this dosing tonight. Do not continue your previous 6 units twice per day regimen.   They have scheduled you for a closer follow up appointment this Wednesday 12/14/18 and 3:15 PM and an appointment with the diabetic coordinator at 3:45PM same day. Please follow up as scheduled.   Return to the ER for new or worsening symptoms including but limited to fever, inability to keep fluids down, abdominal pain, chest pain, shortness of breath, persistently elevated blood sugars, or any other concerns.  Please have your blood pressure rechecked at your follow-up appointment as it was somewhat elevated in the ER.

## 2018-12-12 NOTE — ED Notes (Signed)
Pt eating meal tray 

## 2018-12-12 NOTE — ED Notes (Signed)
Pt verbalized understanding of discharge instructions and denies any further questions at this time.   

## 2018-12-12 NOTE — ED Notes (Signed)
Lunch odered

## 2018-12-12 NOTE — ED Provider Notes (Signed)
[place] EMERGENCY DEPARTMENT Provider Note   CSN: [id number] Arrival date & time: 12/12/18  [id number]     History   Chief Complaint Chief Complaint  Patient presents with  . Hyperglycemia    HPI Hannah Dougherty is a 48 y.o. female with a hx of DM & HTN who presents to the ED for hyperglycemia since discharge from the hospital yesterday.  Patient recently diagnosed with T2DM 9/22, she was started on metformin, ultimately presented to the hospital 9/25 with nausea and vomiting found to be hyperglycemic with AKI and hyponatremia requiring admission.  She states that she was discharged yesterday with improved blood sugars in the 150-200 range with a prescription for Lantus, she was unable to fill this secondary to cost and ultimately started Novolin 6 units twice daily, could increase to 10 units as needed.  She states that she is unable to afford the Lantus, but was given Novolin as substitute for the pharmacist.  She states that last night her blood sugar was in the 500s, she gave herself 6 units of insulin and went to bed.  This morning blood sugar persistently in the 500s, gave herself her 6 units of insulin and called clinic who instructed her to come to the ED. In terms of sxs patient has mild polydipsia/polyuria, otherwise asymptomatic. No alleviating/aggravating factors.  Denies fever, URI symptoms, chest pain, dyspnea, abdominal pain, nausea, vomiting, diarrhea, or dysuria. She has follow appointment w/ internal medicine residency service scheduled October 19th.     HPI  Past Medical History:  Diagnosis Date  . Diabetes mellitus without complication ([place])   . Hypertension     Patient Active Problem List   Diagnosis Date Noted  . AKI (acute kidney injury) ([place]) 12/10/2018  . Hypovolemia 12/10/2018  . Hyponatremia 12/10/2018  . Hyperglycemia 12/09/2018    Past Surgical History:  Procedure Laterality Date  . TUBAL LIGATION    . VOCAL CORD LATERALIZATION,  ENDOSCOPIC APPROACH W/ MLB       OB History   No obstetric history on file.      Home Medications    Prior to Admission medications   Medication Sig Start Date End Date Taking? Authorizing Provider  glucose blood (RELION CONFIRM/MICRO TEST) test strip Use as instructed 12/11/18   Asencion Noble, MD  hydrochlorothiazide (HYDRODIURIL) 25 MG tablet Take 1 tablet (25 mg total) by mouth daily. 12/06/18 02/04/19  Joy, Shawn C, PA-C  ibuprofen (ADVIL) 200 MG tablet Take 400 mg by mouth every 6 (six) hours as needed for moderate pain.    [provider]  ibuprofen (ADVIL,MOTRIN) 600 MG tablet Take 1 tablet (600 mg total) by mouth every 8 (eight) hours as needed for moderate pain. Patient not taking: Reported on 12/09/2018 10/20/16   Dorie Rank, MD  Insulin Glargine (LANTUS SOLOSTAR) 100 UNIT/ML Solostar Pen Inject 10 Units into the skin daily at 10 pm. 12/11/18   Asencion Noble, MD  lisinopril (ZESTRIL) 10 MG tablet Take 1 tablet (10 mg total) by mouth daily. 12/06/18 02/04/19  Joy, Shawn C, PA-C  metFORMIN (GLUCOPHAGE) 500 MG tablet Take 1 tablet (500 mg total) by mouth 2 (two) times daily. 12/06/18 02/04/19  Joy, Shawn C, PA-C  ondansetron (ZOFRAN ODT) 4 MG disintegrating tablet Take 1 tablet (4 mg total) by mouth every 8 (eight) hours as needed for nausea or vomiting. 12/06/18   Joy, Shawn C, PA-C  ReliOn Lancet Devices 30G MISC 1 each by Does not apply route 3 (three)  times daily. 12/11/18   Asencion Noble, MD    Family History No family history on file.  Social History Social History   Tobacco Use  . Smoking status: Former Smoker    Packs/day: 1.50    Quit date: 01/14/2016    Years since quitting: 2.9  . Smokeless tobacco: Never Used  Substance Use Topics  . Alcohol use: Yes  . Drug use: Yes    Types: Marijuana     Allergies   Patient has no known allergies.   Review of Systems Review of Systems  Constitutional: Negative for chills and fever.  HENT: Negative  for congestion, ear pain and sore throat.   Respiratory: Negative for shortness of breath.   Cardiovascular: Negative for chest pain.  Gastrointestinal: Negative for abdominal pain, blood in stool, constipation, diarrhea, nausea and vomiting.  Endocrine: Positive for polydipsia and polyuria.  Genitourinary: Negative for dysuria.  Neurological: Negative for syncope.  All other systems reviewed and are negative.    Physical Exam Updated Vital Signs BP (!) 132/97   Pulse 86   Temp 98.6 F (37 C) (Oral)   Resp 18   LMP 10/28/2016 (Within Weeks)   SpO2 99%   Physical Exam Vitals signs and nursing note reviewed.  Constitutional:      General: She is not in acute distress.    Appearance: She is well-developed. She is not toxic-appearing.  HENT:     Head: Normocephalic and atraumatic.  Eyes:     General:        Right eye: No discharge.        Left eye: No discharge.     Conjunctiva/sclera: Conjunctivae normal.  Neck:     Musculoskeletal: Neck supple.  Cardiovascular:     Rate and Rhythm: Normal rate and regular rhythm.  Pulmonary:     Effort: Pulmonary effort is normal. No respiratory distress.     Breath sounds: Normal breath sounds. No wheezing, rhonchi or rales.  Abdominal:     General: There is no distension.     Palpations: Abdomen is soft.     Tenderness: There is no abdominal tenderness.  Skin:    General: Skin is warm and dry.     Findings: No rash.  Neurological:     Mental Status: She is alert.     Comments: Clear speech.   Psychiatric:        Behavior: Behavior normal.    ED Treatments / Results  Labs (all labs ordered are listed, but only abnormal results are displayed) Labs Reviewed  CBC - Abnormal; Notable for the following components:      Result Value   RBC 5.25 (*)    All other components within normal limits  BASIC METABOLIC PANEL - Abnormal; Notable for the following components:   Sodium 131 (*)    Chloride 92 (*)    Glucose, Bld 316 (*)     Creatinine, Ser 1.49 (*)    GFR calc non Af Amer 41 (*)    GFR calc Af Amer 47 (*)    All other components within normal limits  URINALYSIS, ROUTINE W REFLEX MICROSCOPIC - Abnormal; Notable for the following components:   Glucose, UA 50 (*)    Leukocytes,Ua TRACE (*)    Bacteria, UA RARE (*)    All other components within normal limits  CBG MONITORING, ED - Abnormal; Notable for the following components:   Glucose-Capillary 304 (*)    All other components within normal limits  CBG MONITORING, ED - Abnormal; Notable for the following components:   Glucose-Capillary 148 (*)    All other components within normal limits    EKG None  Radiology No results found.  Procedures Procedures (including critical care time)  Medications Ordered in ED Medications - No data to display   Initial Impression / Assessment and Plan / ED Course  I have reviewed the triage vital signs and the nursing notes.  Pertinent labs & imaging results that were available during my care of the patient were reviewed by me and considered in my medical decision making (see chart for details).   Patient presents to the ED w/ complaints of hyperglycemia, mild symptoms including polyuria/polydipsia, recent new diagnosis of T2DM- hospital admission during which she was started on insulin w/ discharge yesterday. Nontoxic appearing, no apparent distress, BP somewhat elevated- doubt HTN emergency. Benign physical exam. Plan for labs, fluids, & re-assessment.      CBC: No leukocytosis or anemia BMP: Hyperglycemia to 316, bicarb and anion gap are within normal limits.  Mild electrolyte abnormalities as above.  Creatinine somewhat worsened from time of discharge but not substantially elevated. Urinalysis: Trace leuks, rare bacteria, no dysuria, do not suspect UTI.  Patient does not appear to be in DKA.  We will continue fluids, give 5 units of insulin, and reassess.  14:40: CONSULT: Discussed w/ IM residency service- Dr.  Alecia Lemming who spoke with attending Dr. Rebeca Alert- plan for Novolin 10 units in the AM & 8 units in the PM w/ continuation of Metformin, they have arranged for closer follow up this Wednesday @ 15:15 & DM coordinator follow up @ 15:45 same day   Repeat CBG 148.   Patient appears appropriate for discharge home with close scheduled follow up.  I discussed results, treatment plan, need for follow-up, and return precautions with the patient & her friend @ bedside. Provided opportunity for questions, patient confirmed understanding and is in agreement with plan.   Findings and plan of care discussed with supervising physician Dr. Sherry Ruffing who is in agreement.   Final Clinical Impressions(s) / ED Diagnoses   Final diagnoses:  Hyperglycemia    ED Discharge Orders    None       Amaryllis Dyke, PA-C 12/12/18 1642    Tegeler, Gwenyth Allegra, MD 12/12/18 1735

## 2018-12-12 NOTE — ED Notes (Signed)
Pt unable to provide a urine sample. Pt will try later.

## 2018-12-12 NOTE — Progress Notes (Signed)
Received page from the ED requesting recommendations on insulin regimen.  Patient was admitted on 9/25 for hyperglycemia (not in DKA) and discharged yesterday's with instructions to inject Lantus 6U QD and metformin. Per ED PA, patient was unable to afford Lantus and bought over-the-counter Novolin 70/30 which she has been using twice daily.  Last night her BG was in the 500s and she used 6 units. This morning when she woke up her BG was again in the 500s and she used 6 more units.  She called her clinic for further recs who recommended her come to the ED.  Reviewed her labs. No evidence of DKA. Does have and is receiving 1.5L NS in the ED. Case discussed with Dr. Rebeca Alert.   Recommendations:  - Novolin 70/30  10 units in AM and 8 units in PM  - Continue metformin  - Appt in IM center on Wednesday Sept 30th at 3:15PM and with DM coordinator at 3:45pm. Will obtain labs during this appt.  - Patient can call our clinic with questions in the meantime   Welford Roche, MD  Internal Medicine PGY-3  P [phone]

## 2018-12-12 NOTE — ED Triage Notes (Signed)
Patient just discharged from hospital and ongoing hyperglycemia. Taking insulin as prescribed and this am BS greater than 500. Alert and oriented, NAD

## 2018-12-14 ENCOUNTER — Other Ambulatory Visit: Payer: Self-pay

## 2018-12-14 ENCOUNTER — Ambulatory Visit (INDEPENDENT_AMBULATORY_CARE_PROVIDER_SITE_OTHER): Payer: Self-pay | Admitting: Internal Medicine

## 2018-12-14 ENCOUNTER — Encounter: Payer: Self-pay | Admitting: Dietician

## 2018-12-14 VITALS — BP 145/88 | HR 75 | Temp 98.2°F | Ht 65.0 in | Wt 209.0 lb

## 2018-12-14 DIAGNOSIS — Z87891 Personal history of nicotine dependence: Secondary | ICD-10-CM

## 2018-12-14 DIAGNOSIS — I10 Essential (primary) hypertension: Secondary | ICD-10-CM

## 2018-12-14 DIAGNOSIS — N179 Acute kidney failure, unspecified: Secondary | ICD-10-CM

## 2018-12-14 DIAGNOSIS — E119 Type 2 diabetes mellitus without complications: Secondary | ICD-10-CM

## 2018-12-14 DIAGNOSIS — Z0000 Encounter for general adult medical examination without abnormal findings: Secondary | ICD-10-CM | POA: Insufficient documentation

## 2018-12-14 DIAGNOSIS — Z794 Long term (current) use of insulin: Secondary | ICD-10-CM

## 2018-12-14 DIAGNOSIS — R739 Hyperglycemia, unspecified: Secondary | ICD-10-CM

## 2018-12-14 DIAGNOSIS — Z23 Encounter for immunization: Secondary | ICD-10-CM

## 2018-12-14 LAB — GLUCOSE, CAPILLARY: Glucose-Capillary: 262 mg/dL — ABNORMAL HIGH (ref 70–99)

## 2018-12-14 MED ORDER — LANTUS SOLOSTAR 100 UNIT/ML ~~LOC~~ SOPN: 10.0000 [IU] | PEN_INJECTOR | Freq: Every day | SUBCUTANEOUS | 0 refills | Status: DC

## 2018-12-14 MED ORDER — METFORMIN HCL 1000 MG PO TABS: 1000.0000 mg | ORAL_TABLET | Freq: Two times a day (BID) | ORAL | 1 refills | Status: DC

## 2018-12-14 MED FILL — metFORMIN HCL 1000 MG TABS: 1000 | 30 days supply | Qty: 60 | Fill #0

## 2018-12-14 MED FILL — LANTUS SOLOSTAR 100 UNITS/M: 100 | 30 days supply | Qty: 3 | Fill #0

## 2018-12-14 NOTE — Patient Instructions (Addendum)
It was a pleasure to see you today Ms. Hannah Dougherty. Please make the following changes:  I have prescribed lantus for you to pick up at low cost at [facility] outpatient pharmacy. Please take 10u nightly. Continue to monitor your blood glucose and call us if it is consistently above 250.   Please continue to take lisinopril and hydrocholothiazide  FOLLOW UP IN 2 WEEKS   If you have any questions or concerns, please call our clinic at [phone] between 9am-5pm and after hours call [phone] and ask for the internal medicine resident on call. If you feel you are having a medical emergency please call 911.   Thank you, we look forward to help you remain healthy!  Lars Mage, MD Internal Medicine PGY3

## 2018-12-14 NOTE — Assessment & Plan Note (Signed)
  Patient wast admitted from 9/25-9/27 for symptomatic hyperglycemia and diagnosed with diabetes. Her a1c during the hospitalization was 11.4.   The patient was started on lantus 10u qd, but she was not able to afford the medication. She returned to ED for follow up due to her blood glucose levels being in the 500s. IMTS inpatient team was consulted and the patient was instructed to take novolog 10u in the morning and novolog 8u in the afternoon.   Assessment and plan  The patient states that she has been taking novolog as instructed for the past 2 days. Her random blood glucose during this visit was 262.   Prescribed lantus 10u qhs which was sent to [facility]. Patietn is to work with financial services. Increased metformin 1000mg  bid.   -Lantus 10u qhs  -metformin 1000mg  bid

## 2018-12-14 NOTE — Progress Notes (Signed)
   CC: Diabetes follow up   HPI:  [name] is a 48 y.o. female with diabetes and hypertension who is here to establish care for diabetes and hospital follow up. Please see problem based charting for evaluation, assessment, and plan.  Past Medical History:  Diagnosis Date  . Diabetes mellitus without complication ([place])   . Hypertension    Family History  Problem Relation Age of Onset  . Hypertension Mother   . Diabetes Mother   . Kidney disease Mother   . Diabetes Father   . Hypertension Father   . Hypertension Maternal Grandmother   . Diabetes Maternal Grandmother   . Breast cancer Maternal Grandmother     Social History   Socioeconomic History  . Marital status: Single    Spouse name: Not on file  . Number of children: Not on file  . Years of education: Not on file  . Highest education level: Not on file  Occupational History  . Not on file  Social Needs  . Financial resource strain: Not on file  . Food insecurity    Worry: Not on file    Inability: Not on file  . Transportation needs    Medical: Not on file    Non-medical: Not on file  Tobacco Use  . Smoking status: Former Smoker    Packs/day: 1.50    Quit date: 01/14/2016    Years since quitting: 2.9  . Smokeless tobacco: Never Used  Substance and Sexual Activity  . Alcohol use: Yes  . Drug use: Yes    Types: Marijuana  . Sexual activity: Not on file  Lifestyle  . Physical activity    Days per week: Not on file    Minutes per session: Not on file  . Stress: Not on file  Relationships  . Social Herbalist on phone: Not on file    Gets together: Not on file    Attends religious service: Not on file    Active member of club or organization: Not on file    Attends meetings of clubs or organizations: Not on file    Relationship status: Not on file  Other Topics Concern  . Not on file  Social History Narrative  . Not on file    Review of Systems:    Review of Systems   Respiratory: Negative for cough and shortness of breath.   Cardiovascular: Negative for chest pain.  Gastrointestinal: Negative for abdominal pain, nausea and vomiting.  Neurological: Negative for dizziness and headaches.   Physical Exam:  Vitals:   12/14/18 1600 12/14/18 1605  BP: (!) 161/87 (!) 145/88  Pulse: 74 75  Temp: 98.2 F (36.8 C)   TempSrc: Oral   SpO2: 100%   Weight: 209 lb (94.8 kg)   Height: 5\' 5"  (1.651 m)    Physical Exam  Constitutional: Appears well-developed and well-nourished. No distress.  HENT:  Head: Normocephalic and atraumatic.  Eyes: Conjunctivae are normal.  Cardiovascular: Normal rate, regular rhythm and normal heart sounds.  Respiratory: Effort normal and breath sounds normal. No respiratory distress. No wheezes.  GI: Soft. Bowel sounds are normal. No distension. There is no tenderness.  Musculoskeletal: No edema.  Neurological: Is alert.  Skin: Not diaphoretic. No erythema.  Psychiatric: Normal mood and affect. Behavior is normal. Judgment and thought content normal.    Assessment & Plan:   See Encounters Tab for problem based charting.  Patient discussed with Dr. Lynnae January

## 2018-12-14 NOTE — Assessment & Plan Note (Addendum)
Patient was given influenza vaccination during this encounter

## 2018-12-14 NOTE — Assessment & Plan Note (Signed)
Patient had prerenal aki of 2.0 which improved to 1.1 on discharge.   -recheck bmp

## 2018-12-15 ENCOUNTER — Encounter: Payer: Self-pay | Admitting: Internal Medicine

## 2018-12-15 LAB — BMP8+ANION GAP
Anion Gap: 18 mmol/L (ref 10.0–18.0)
BUN/Creatinine Ratio: 11 (ref 9–23)
BUN: 14 mg/dL (ref 6–24)
CO2: 22 mmol/L (ref 20–29)
Calcium: 9.7 mg/dL (ref 8.7–10.2)
Chloride: 92 mmol/L — ABNORMAL LOW (ref 96–106)
Creatinine, Ser: 1.33 mg/dL — ABNORMAL HIGH (ref 0.57–1.00)
GFR calc Af Amer: 54 mL/min/{1.73_m2} — ABNORMAL LOW (ref >59)
GFR calc non Af Amer: 47 mL/min/{1.73_m2} — ABNORMAL LOW (ref >59)
Glucose: 263 mg/dL — ABNORMAL HIGH (ref 65–99)
Potassium: 4.9 mmol/L (ref 3.5–5.2)
Sodium: 132 mmol/L — ABNORMAL LOW (ref 134–144)

## 2018-12-17 NOTE — Progress Notes (Signed)
Internal Medicine Clinic Attending  Case discussed with Dr. Chundi at the time of the visit.  We reviewed the resident's history and exam and pertinent patient test results.  I agree with the assessment, diagnosis, and plan of care documented in the resident's note. 

## 2018-12-20 ENCOUNTER — Other Ambulatory Visit: Payer: Self-pay | Admitting: Internal Medicine

## 2018-12-20 ENCOUNTER — Telehealth: Payer: Self-pay | Admitting: *Deleted

## 2018-12-20 MED ORDER — PEN NEEDLES 31G X 5 MM MISC: 1.0000 | Freq: Three times a day (TID) | 1 refills | Status: DC

## 2018-12-20 NOTE — Telephone Encounter (Signed)
sent 

## 2018-12-20 NOTE — Progress Notes (Signed)
pen

## 2018-12-20 NOTE — Telephone Encounter (Signed)
Call from [facility] - needs rx for pen needles (for lantus solostar); also include "IM Program" on the rx. Thanks

## 2018-12-28 ENCOUNTER — Encounter: Payer: Self-pay | Admitting: Internal Medicine

## 2018-12-28 ENCOUNTER — Other Ambulatory Visit: Payer: Self-pay

## 2018-12-28 ENCOUNTER — Ambulatory Visit (INDEPENDENT_AMBULATORY_CARE_PROVIDER_SITE_OTHER): Payer: Self-pay | Admitting: Internal Medicine

## 2018-12-28 VITALS — BP 186/93 | HR 70 | Temp 98.3°F | Ht 60.0 in | Wt 211.0 lb

## 2018-12-28 DIAGNOSIS — I10 Essential (primary) hypertension: Secondary | ICD-10-CM | POA: Insufficient documentation

## 2018-12-28 DIAGNOSIS — E118 Type 2 diabetes mellitus with unspecified complications: Secondary | ICD-10-CM

## 2018-12-28 DIAGNOSIS — E119 Type 2 diabetes mellitus without complications: Secondary | ICD-10-CM

## 2018-12-28 DIAGNOSIS — Z79899 Other long term (current) drug therapy: Secondary | ICD-10-CM

## 2018-12-28 DIAGNOSIS — Z794 Long term (current) use of insulin: Secondary | ICD-10-CM

## 2018-12-28 DIAGNOSIS — R739 Hyperglycemia, unspecified: Secondary | ICD-10-CM

## 2018-12-28 MED ORDER — CANAGLIFLOZIN 100 MG PO TABS: 100.0000 mg | ORAL_TABLET | Freq: Every day | ORAL | 3 refills | Status: DC

## 2018-12-28 MED ORDER — LANTUS SOLOSTAR 100 UNIT/ML ~~LOC~~ SOPN: 12.0000 [IU] | PEN_INJECTOR | Freq: Every day | SUBCUTANEOUS | 0 refills | Status: DC

## 2018-12-28 MED FILL — INVOKANA 100 MG TABLET: 100 | 30 days supply | Qty: 30 | Fill #0

## 2018-12-28 NOTE — Assessment & Plan Note (Signed)
Patient presents for continued evaluation and management of her hyperglycemia. She was last seen approximately one month ago and has been working to titrate up her metformin. Unfortunately due to diarrhea and upset stomach she is only on 500 mg twice daily. She has been taking her Lantus 10 units daily as prescribed. She states that she never misses a dose of her Lantus. She has been experimenting with her diet to try to correlate her hyperglycemia with food. She is tried to cut back on sugary beverages and high carb foods. She hasn't had no signs or symptoms of hypoglycemia. She did bring in her meter today. She has been checking her blood sugars 2 to 3 times per day. Her lowest reported blood sugar is 135. Her highest recorded blood sugar was 458. Her average blood sugar was 234.  A/P: - Uncontrolled  - Continue Metformin 500 mg BID  - Increase lantus to 12 units QD  - START canagliflozin 100 mg QD, increase to 300 mg at her next visit if she is tolerating the medication well and her CBGs remain above goal - Continue lisinopril  - Will need to check a lipid panel and she will need statin therapy

## 2018-12-28 NOTE — Progress Notes (Signed)
   CC: Hyperglycemia  HPI:  [name] is a 48 y.o. female with PMHx listed below presenting for hyperglycemia. Please see the A&P for the status of the patient's chronic medical problems.  Past Medical History:  Diagnosis Date  . Diabetes mellitus without complication ([place])   . Hypertension    Review of Systems:  Performed and all others negative.  Physical Exam: Vitals:   12/28/18 1334  BP: (!) 186/93  Pulse: 70  Temp: 98.3 F (36.8 C)  TempSrc: Oral  SpO2: 100%  Weight: 211 lb (95.7 kg)  Height: 5' (1.524 m)   General: Obese female in no acute distress Pulm: Good air movement with no wheezing or crackles  CV: RRR, no murmurs, no rubs   Assessment & Plan:   See Encounters Tab for problem based charting.  Patient discussed with Dr. Evette Dougherty

## 2018-12-28 NOTE — Patient Instructions (Signed)
Thank you for allowing me to provide your care. Today we are increasing your lantus to 12 units and starting you on a medication call Invokona.   Please come back to see [place] in 2-3 weeks or sooner if any issues arise.

## 2018-12-28 NOTE — Assessment & Plan Note (Signed)
Patient is hypertensive today. She is currently on lisinopril 10 mg daily and hydrochlorothiazide 25 mg daily. We discussed adding a new medication however given that we are starting an SGL T2 inhibitor we will hold off for now. If at her next visit she continues to be hypertensive we will either need to increase her lisinopril or add on a calcium channel blocker.

## 2018-12-29 NOTE — Progress Notes (Signed)
Internal Medicine Clinic Attending  Case discussed with Dr. Helberg at the time of the visit.  We reviewed the resident's history and exam and pertinent patient test results.  I agree with the assessment, diagnosis, and plan of care documented in the resident's note.    

## 2019-01-02 ENCOUNTER — Inpatient Hospital Stay: Payer: Self-pay | Admitting: Internal Medicine

## 2019-01-04 ENCOUNTER — Ambulatory Visit: Payer: Self-pay

## 2019-01-04 ENCOUNTER — Encounter: Payer: Self-pay | Admitting: Radiation Oncology

## 2019-01-10 MED FILL — LANTUS SOLOSTAR 100 UNITS/M: 100 | 25 days supply | Qty: 3 | Fill #0

## 2019-02-06 MED FILL — LANTUS SOLOSTAR 100 UNITS/M: 100 | 25 days supply | Qty: 3 | Fill #1

## 2019-02-13 ENCOUNTER — Encounter: Payer: Self-pay | Admitting: Radiation Oncology

## 2019-02-21 ENCOUNTER — Other Ambulatory Visit: Payer: Self-pay | Admitting: Radiation Oncology

## 2019-02-21 DIAGNOSIS — E119 Type 2 diabetes mellitus without complications: Secondary | ICD-10-CM

## 2019-02-21 NOTE — Telephone Encounter (Signed)
Need refill on lisinopril (ZESTRIL) 10 MG tablet(Expired) hydrochlorothiazide (HYDRODIURIL) 25 MG tablet(Expired) metFORMIN (GLUCOPHAGE) 1000 MG tablet Insulin Glargine (LANTUS SOLOSTAR) 100 UNIT/ML Solostar Pen Insulin Pen Needle (PEN NEEDLES) 31G X 5 MM MISC  ;pt contact [phone]

## 2019-02-24 MED ORDER — HYDROCHLOROTHIAZIDE 25 MG PO TABS: 25.0000 mg | ORAL_TABLET | Freq: Every day | ORAL | 5 refills | Status: DC

## 2019-02-24 MED ORDER — LANTUS SOLOSTAR 100 UNIT/ML ~~LOC~~ SOPN: 12.0000 [IU] | PEN_INJECTOR | Freq: Every day | SUBCUTANEOUS | 5 refills | Status: DC

## 2019-02-24 MED ORDER — LISINOPRIL 10 MG PO TABS: 10.0000 mg | ORAL_TABLET | Freq: Every day | ORAL | 5 refills | Status: DC

## 2019-02-24 MED FILL — LANTUS SOLOSTAR 100 UNITS/M: 100 | 25 days supply | Qty: 3 | Fill #0

## 2019-02-24 MED FILL — LISINOPRIL 10 MG TABS: 10 | 30 days supply | Qty: 30 | Fill #0

## 2019-02-24 MED FILL — HYDROCHLOROTHIAZIDE 25 MG T: 25 | 30 days supply | Qty: 30 | Fill #0

## 2019-03-06 MED FILL — UNIFINE PENTIPS 31GX3/16: 31G X 5 MM | 25 days supply | Qty: 100 | Fill #0

## 2019-03-06 MED FILL — UNIFINE PENTIPS 31GX3/16": 31G X 5 MM | 25 days supply | Qty: 100 | Fill #0

## 2019-03-07 ENCOUNTER — Other Ambulatory Visit: Payer: Self-pay

## 2019-03-07 ENCOUNTER — Encounter (HOSPITAL_COMMUNITY): Payer: Self-pay | Admitting: Emergency Medicine

## 2019-03-07 ENCOUNTER — Emergency Department (HOSPITAL_COMMUNITY)
Admission: EM | Admit: 2019-03-07 | Discharge: 2019-03-07 | Disposition: A | Discharge status: Home / Self Care | Payer: Self-pay | Attending: Emergency Medicine | Admitting: Emergency Medicine

## 2019-03-07 DIAGNOSIS — F17210 Nicotine dependence, cigarettes, uncomplicated: Secondary | ICD-10-CM | POA: Insufficient documentation

## 2019-03-07 DIAGNOSIS — E119 Type 2 diabetes mellitus without complications: Secondary | ICD-10-CM | POA: Insufficient documentation

## 2019-03-07 DIAGNOSIS — R350 Frequency of micturition: Secondary | ICD-10-CM | POA: Insufficient documentation

## 2019-03-07 DIAGNOSIS — Z79899 Other long term (current) drug therapy: Secondary | ICD-10-CM | POA: Insufficient documentation

## 2019-03-07 DIAGNOSIS — R112 Nausea with vomiting, unspecified: Secondary | ICD-10-CM

## 2019-03-07 DIAGNOSIS — I10 Essential (primary) hypertension: Secondary | ICD-10-CM | POA: Insufficient documentation

## 2019-03-07 DIAGNOSIS — Z794 Long term (current) use of insulin: Secondary | ICD-10-CM | POA: Insufficient documentation

## 2019-03-07 LAB — CBG MONITORING, ED: Glucose-Capillary: 296 mg/dL — ABNORMAL HIGH (ref 70–99)

## 2019-03-07 LAB — URINALYSIS, ROUTINE W REFLEX MICROSCOPIC
Bilirubin Urine: NEGATIVE
Glucose, UA: 150 mg/dL — AB
Ketones, ur: 20 mg/dL — AB
Nitrite: NEGATIVE
Protein, ur: 100 mg/dL — AB
Specific Gravity, Urine: 1.02 (ref 1.005–1.030)
pH: 6 (ref 5.0–8.0)

## 2019-03-07 LAB — COMPREHENSIVE METABOLIC PANEL
ALT: 13 U/L (ref 0–44)
AST: 14 U/L — ABNORMAL LOW (ref 15–41)
Albumin: 4.3 g/dL (ref 3.5–5.0)
Alkaline Phosphatase: 69 U/L (ref 38–126)
Anion gap: 15 (ref 5–15)
BUN: 17 mg/dL (ref 6–20)
CO2: 24 mmol/L (ref 22–32)
Calcium: 9.5 mg/dL (ref 8.9–10.3)
Chloride: 92 mmol/L — ABNORMAL LOW (ref 98–111)
Creatinine, Ser: 1.39 mg/dL — ABNORMAL HIGH (ref 0.44–1.00)
GFR calc Af Amer: 51 mL/min — ABNORMAL LOW (ref >60)
GFR calc non Af Amer: 44 mL/min — ABNORMAL LOW (ref >60)
Glucose, Bld: 276 mg/dL — ABNORMAL HIGH (ref 70–99)
Potassium: 4 mmol/L (ref 3.5–5.1)
Sodium: 131 mmol/L — ABNORMAL LOW (ref 135–145)
Total Bilirubin: 0.8 mg/dL (ref 0.3–1.2)
Total Protein: 8.3 g/dL — ABNORMAL HIGH (ref 6.5–8.1)

## 2019-03-07 LAB — CBC
HCT: 41.5 % (ref 36.0–46.0)
Hemoglobin: 13.8 g/dL (ref 12.0–15.0)
MCH: 27.7 pg (ref 26.0–34.0)
MCHC: 33.3 g/dL (ref 30.0–36.0)
MCV: 83.3 fL (ref 80.0–100.0)
Platelets: 303 10*3/uL (ref 150–400)
RBC: 4.98 MIL/uL (ref 3.87–5.11)
RDW: 14.1 % (ref 11.5–15.5)
WBC: 13.8 10*3/uL — ABNORMAL HIGH (ref 4.0–10.5)
nRBC: 0 % (ref 0.0–0.2)

## 2019-03-07 LAB — I-STAT BETA HCG BLOOD, ED (MC, WL, AP ONLY): I-stat hCG, quantitative: <5 m[IU]/mL (ref <5)

## 2019-03-07 LAB — LIPASE, BLOOD: Lipase: 37 U/L (ref 11–51)

## 2019-03-07 MED ORDER — ONDANSETRON 4 MG PO TBDP: 4.0000 mg | ORAL_TABLET | Freq: Three times a day (TID) | ORAL | 0 refills | Status: DC | PRN

## 2019-03-07 MED ORDER — ONDANSETRON HCL 4 MG/2ML IJ SOLN
4.0000 mg | Freq: Once | INTRAMUSCULAR | Status: AC
  Administered 2019-03-07: 4 mg via INTRAVENOUS
  Filled 2019-03-07: qty 2

## 2019-03-07 MED ORDER — SODIUM CHLORIDE 0.9% FLUSH: 3.0000 mL | Freq: Once | INTRAVENOUS | Status: DC

## 2019-03-07 MED ORDER — SODIUM CHLORIDE 0.9 % IV BOLUS
1000.0000 mL | Freq: Once | INTRAVENOUS | Status: AC
  Administered 2019-03-07: 1000 mL via INTRAVENOUS

## 2019-03-07 MED ORDER — LISINOPRIL 10 MG PO TABS
10.0000 mg | ORAL_TABLET | Freq: Once | ORAL | Status: AC
  Administered 2019-03-07: 10 mg via ORAL
  Filled 2019-03-07: qty 1

## 2019-03-07 NOTE — ED Notes (Signed)
Pt states that she was out of her B/p meds for about 1 week just started taking them again this morning

## 2019-03-07 NOTE — ED Provider Notes (Signed)
   Bp trending down after receiving home dose bp medicine Still having some nausea, will dose zofran and reassess  Patient care assumed at 1500. Please see original H&P from this Emergency Department encounter for full History and Physical.  Briefly, this is a 48 y.o. female w PMHx HTN, T2DM, presenting to the ED with complaint of N/V that began yesterday. Pt reports she ate a sandwich from [employer], and began having nausea and vomiting after. She states she took her BP medications this morning however is unsure if they stayed down. She reports that her stomach is upset, though would not call it painful. She denies assoc F/C, diarrhea, constipation. Reports inc urinary frequency. No URI sx. No medications tried for symptoms.  Plan per team at sign-out: -Reassess her symptoms of nausea, p.o. test and monitor her blood pressure.   Course of Care: Assumed care of pt. Pt is resting comfortably. No acute events under my care.  Reassessed patient, still feeling nauseated, will give another dose of Zofran.  Her blood pressure is trending down appropriately, does not have evidence of endorgan damage at this time.  Reassess patient again and she is asymptomatic at this time and is ready for discharge.  She admits to using marijuana frequently, discussed that sometimes this can cause cyclic vomiting syndrome, discussed abstaining to try and prevent vomiting in the future. Discussed plan with patient and family including strict return precautions and follow up with PCP. Patient and family understand and are amenable with plan.    Labs Reviewed  COMPREHENSIVE METABOLIC PANEL - Abnormal; Notable for the following components:      Result Value   Sodium 131 (*)    Chloride 92 (*)    Glucose, Bld 276 (*)    Creatinine, Ser 1.39 (*)    Total Protein 8.3 (*)    AST 14 (*)    GFR calc non Af Amer 44 (*)    GFR calc Af Amer 51 (*)    All other components within normal limits  CBC - Abnormal; Notable for the  following components:   WBC 13.8 (*)    All other components within normal limits  CBG MONITORING, ED - Abnormal; Notable for the following components:   Glucose-Capillary 296 (*)    All other components within normal limits  LIPASE, BLOOD  URINALYSIS, ROUTINE W REFLEX MICROSCOPIC  I-STAT BETA HCG BLOOD, ED ([facility], [facility], [facility] ONLY)    Labs reviewed by myself and considered in medical decision making.  No orders to display    Imaging reviewed by myself and considered in medical decision making. Imaging final read interpreted by radiology.    Patient care discussed with attending physician who is in agreement with the above plan. Attending has seen and examined the patient. See note cosigner for attending name.     Era Bumpers, MD 03/07/19 1720    Tegeler, Gwenyth Allegra, MD 03/08/19 0110

## 2019-03-07 NOTE — ED Provider Notes (Signed)
[facility] EMERGENCY DEPARTMENT Provider Note   CSN: [id number] Arrival date & time: 03/07/19  1242     History Chief Complaint  Patient presents with  . Emesis    Hannah Dougherty is a 48 y.o. female w PMHx HTN, T2DM, presenting to the ED with complaint of N/V that began yesterday. Pt reports she ate a sandwich from [employer], and began having nausea and vomiting after. She states she took her BP medications this morning however is unsure if they stayed down. She reports that her stomach is upset, though would not call it painful. She denies assoc F/C, diarrhea, constipation. Reports inc urinary frequency. No URI sx. No medications tried for symptoms.  The history is provided by the patient.       Past Medical History:  Diagnosis Date  . Diabetes mellitus without complication ([place])   . Hypertension     Patient Active Problem List   Diagnosis Date Noted  . Hypertension 12/28/2018  . Healthcare maintenance 12/14/2018  . Hyponatremia 12/10/2018  . Diabetes ([place]) 12/09/2018    Past Surgical History:  Procedure Laterality Date  . TUBAL LIGATION    . VOCAL CORD LATERALIZATION, ENDOSCOPIC APPROACH W/ MLB       OB History   No obstetric history on file.     Family History  Problem Relation Age of Onset  . Hypertension Mother   . Diabetes Mother   . Kidney disease Mother   . Diabetes Father   . Hypertension Father   . Hypertension Maternal Grandmother   . Diabetes Maternal Grandmother   . Breast cancer Maternal Grandmother     Social History   Tobacco Use  . Smoking status: Current Every Day Smoker    Packs/day: 0.50    Types: Cigarettes  . Smokeless tobacco: Never Used  Substance Use Topics  . Alcohol use: Yes  . Drug use: Yes    Types: Marijuana    Home Medications Prior to Admission medications   Medication Sig Start Date End Date Taking? Authorizing Provider  canagliflozin (INVOKANA) 100 MG TABS tablet Take 1 tablet (100 mg total)  by mouth daily before breakfast. 12/28/18   Helberg, Larkin Ina, MD  glucose blood (RELION CONFIRM/MICRO TEST) test strip Use as instructed 12/11/18   Asencion Noble, MD  hydrochlorothiazide (HYDRODIURIL) 25 MG tablet Take 1 tablet (25 mg total) by mouth daily. 02/24/19 04/25/19  Aldine Contes, MD  ibuprofen (ADVIL) 200 MG tablet Take 400 mg by mouth every 6 (six) hours as needed for moderate pain.    [provider]  ibuprofen (ADVIL,MOTRIN) 600 MG tablet Take 1 tablet (600 mg total) by mouth every 8 (eight) hours as needed for moderate pain. Patient not taking: Reported on 12/09/2018 10/20/16   Dorie Rank, MD  Insulin Glargine (LANTUS SOLOSTAR) 100 UNIT/ML Solostar Pen Inject 12 Units into the skin daily at 10 pm. 02/24/19 05/25/19  Aldine Contes, MD  Insulin Pen Needle (PEN NEEDLES) 31G X 5 MM MISC 1 Dose by Does not apply route 4 (four) times daily -  before meals and at bedtime. 12/20/18   Chundi, Verne Spurr, MD  lisinopril (ZESTRIL) 10 MG tablet Take 1 tablet (10 mg total) by mouth daily. 02/24/19 04/25/19  Aldine Contes, MD  metFORMIN (GLUCOPHAGE) 1000 MG tablet Take 1 tablet (1,000 mg total) by mouth 2 (two) times daily. 12/14/18 03/14/19  Chundi, Verne Spurr, MD  ondansetron (ZOFRAN ODT) 4 MG disintegrating tablet Take 1 tablet (4 mg total) by mouth every 8 (eight)  hours as needed for nausea or vomiting. 03/07/19   Naoma Boxell, [place] N, PA-C  ReliOn Lancet Devices 30G MISC 1 each by Does not apply route 3 (three) times daily. 12/11/18   Asencion Noble, MD    Allergies    Patient has no known allergies.  Review of Systems   Review of Systems  Constitutional: Negative for chills and fever.  Gastrointestinal: Positive for nausea and vomiting. Negative for abdominal pain, constipation and diarrhea.  Endocrine: Positive for polyuria.  All other systems reviewed and are negative.   Physical Exam Updated Vital Signs BP (!) 231/93   Pulse 61   Temp 98.2 F (36.8 C) (Oral)   Resp 18    LMP 10/28/2016 (Within Weeks)   SpO2 100%   Physical Exam Vitals and nursing note reviewed.  Constitutional:      General: She is not in acute distress.    Appearance: She is well-developed.  HENT:     Head: Normocephalic and atraumatic.  Eyes:     Conjunctiva/sclera: Conjunctivae normal.  Cardiovascular:     Rate and Rhythm: Normal rate and regular rhythm.  Pulmonary:     Effort: Pulmonary effort is normal. No respiratory distress.     Breath sounds: Normal breath sounds.  Abdominal:     General: Bowel sounds are normal.     Palpations: Abdomen is soft.     Tenderness: There is no abdominal tenderness. There is no guarding or rebound.  Skin:    General: Skin is warm.  Neurological:     Mental Status: She is alert.  Psychiatric:        Behavior: Behavior normal.     ED Results / Procedures / Treatments   Labs (all labs ordered are listed, but only abnormal results are displayed) Labs Reviewed  COMPREHENSIVE METABOLIC PANEL - Abnormal; Notable for the following components:      Result Value   Sodium 131 (*)    Chloride 92 (*)    Glucose, Bld 276 (*)    Creatinine, Ser 1.39 (*)    Total Protein 8.3 (*)    AST 14 (*)    GFR calc non Af Amer 44 (*)    GFR calc Af Amer 51 (*)    All other components within normal limits  CBC - Abnormal; Notable for the following components:   WBC 13.8 (*)    All other components within normal limits  CBG MONITORING, ED - Abnormal; Notable for the following components:   Glucose-Capillary 296 (*)    All other components within normal limits  LIPASE, BLOOD  URINALYSIS, ROUTINE W REFLEX MICROSCOPIC  I-STAT BETA HCG BLOOD, ED ([facility], [facility], [facility] ONLY)    EKG EKG Interpretation  Date/Time:  Tuesday March 07 2019 13:00:29 EST Ventricular Rate:  67 PR Interval:  122 QRS Duration: 80 QT Interval:  430 QTC Calculation: 454 R Axis:   56 Text Interpretation: Normal sinus rhythm Minimal voltage criteria for LVH, may be normal variant (  Sokolow-Lyon ) Borderline ECG no significant change since Sept 2020 Confirmed by Sherwood Gambler [phone]) on 03/07/2019 1:49:47 PM   Radiology No results found.  Procedures Procedures (including critical care time)  Medications Ordered in ED Medications  sodium chloride flush (NS) 0.9 % injection 3 mL (has no administration in time range)  ondansetron (ZOFRAN) injection 4 mg (4 mg Intravenous Given 03/07/19 1402)  sodium chloride 0.9 % bolus 1,000 mL (1,000 mLs Intravenous New Bag/Given 03/07/19 1402)  lisinopril (ZESTRIL) tablet 10  mg (10 mg Oral Given 03/07/19 1515)    ED Course  I have reviewed the triage vital signs and the nursing notes.  Pertinent labs & imaging results that were available during my care of the patient were reviewed by me and considered in my medical decision making (see chart for details).    MDM Rules/Calculators/A&P                      Patient presenting with nausea and vomiting that began yesterday.  She denies pain in her abdomen though reports discomfort.  No fevers, diarrhea or constipation.  On exam she is in no distress, abdomen is soft and nontender.  Unsure she kept her blood pressure medications down, however she is quite hypertensive here in the ED. Afebrile. IVF and zofran ordered   Labs with leukocytosis of 13.8. Hyperglycemic with normal gap and bicarb. Cr appears to be at baseline. Suspect viral gastroenteritis vs possible foodborne illness. Pt with improvement in nausea after zofran. IVF still running. Pt remains hypertensive, suspect medication was not digested when taken earlier today prior to vomiting. Will redose home dose of lisinopril and finish IVF. Plan to PO challenge and re-evaluate with anticipated d/c with symptomatic management. Pt agreeable with plan at this time. Care assumed at shift change by Dr. Elana Alm   Final Clinical Impression(s) / ED Diagnoses Final diagnoses:  Non-intractable vomiting with nausea, unspecified vomiting type      Rx / DC Orders ED Discharge Orders         Ordered    ondansetron (ZOFRAN ODT) 4 MG disintegrating tablet  Every 8 hours PRN     03/07/19 1522           Demaree Liberto, [place] N, PA-C 03/07/19 1522    Sherwood Gambler, MD 03/08/19 1734

## 2019-03-07 NOTE — Discharge Instructions (Signed)
Please read instructions below. Drink clear liquids until your stomach feels better. Then, slowly introduce bland foods into your diet as tolerated, such as bread, rice, apples, bananas. You can take zofran every 8 hours as needed for nausea. Follow up with your primary care if symptoms persist. Return to the ER for severe abdominal pain, fever, uncontrollable vomiting, or new or concerning symptoms.

## 2019-03-07 NOTE — ED Notes (Signed)
Urine Culture sent to Main Lab with UA 

## 2019-03-07 NOTE — ED Triage Notes (Signed)
Pt to ER for evaluation of epigastric abdominal pain with nausea and vomiting onset after eating a steak biscuit from [employer] yesterday. She reports 6-8 episodes in the last 24 hours. She feels her sugar may be elevated, reports has been unable to tolerate anything PO including home medications. She is a/o x4. NAD at this time. CBG 296.

## 2019-03-11 ENCOUNTER — Encounter (HOSPITAL_COMMUNITY): Payer: Self-pay | Admitting: Emergency Medicine

## 2019-03-11 ENCOUNTER — Other Ambulatory Visit: Payer: Self-pay

## 2019-03-11 ENCOUNTER — Emergency Department (HOSPITAL_COMMUNITY)
Admission: EM | Admit: 2019-03-11 | Discharge: 2019-03-11 | Discharge status: Against Medical Advice | Payer: Self-pay | Attending: Emergency Medicine | Admitting: Emergency Medicine

## 2019-03-11 DIAGNOSIS — Z5321 Procedure and treatment not carried out due to patient leaving prior to being seen by health care provider: Secondary | ICD-10-CM | POA: Insufficient documentation

## 2019-03-11 LAB — COMPREHENSIVE METABOLIC PANEL WITH GFR
ALT: 16 U/L (ref 0–44)
AST: 19 U/L (ref 15–41)
Albumin: 3.9 g/dL (ref 3.5–5.0)
Alkaline Phosphatase: 68 U/L (ref 38–126)
Anion gap: 13 (ref 5–15)
BUN: 27 mg/dL — ABNORMAL HIGH (ref 6–20)
CO2: 29 mmol/L (ref 22–32)
Calcium: 9.1 mg/dL (ref 8.9–10.3)
Chloride: 86 mmol/L — ABNORMAL LOW (ref 98–111)
Creatinine, Ser: 1.74 mg/dL — ABNORMAL HIGH (ref 0.44–1.00)
GFR calc Af Amer: 39 mL/min — ABNORMAL LOW
GFR calc non Af Amer: 34 mL/min — ABNORMAL LOW
Glucose, Bld: 278 mg/dL — ABNORMAL HIGH (ref 70–99)
Potassium: 3.1 mmol/L — ABNORMAL LOW (ref 3.5–5.1)
Sodium: 128 mmol/L — ABNORMAL LOW (ref 135–145)
Total Bilirubin: 0.7 mg/dL (ref 0.3–1.2)
Total Protein: 7.4 g/dL (ref 6.5–8.1)

## 2019-03-11 LAB — CBC
HCT: 43.1 % (ref 36.0–46.0)
Hemoglobin: 14.4 g/dL (ref 12.0–15.0)
MCH: 27.6 pg (ref 26.0–34.0)
MCHC: 33.4 g/dL (ref 30.0–36.0)
MCV: 82.7 fL (ref 80.0–100.0)
Platelets: 316 10*3/uL (ref 150–400)
RBC: 5.21 MIL/uL — ABNORMAL HIGH (ref 3.87–5.11)
RDW: 13.5 % (ref 11.5–15.5)
WBC: 9.4 10*3/uL (ref 4.0–10.5)
nRBC: 0 % (ref 0.0–0.2)

## 2019-03-11 LAB — LIPASE, BLOOD: Lipase: 38 U/L (ref 11–51)

## 2019-03-11 LAB — I-STAT BETA HCG BLOOD, ED (MC, WL, AP ONLY): I-stat hCG, quantitative: <5 m[IU]/mL

## 2019-03-11 MED ORDER — SODIUM CHLORIDE 0.9% FLUSH: 3.0000 mL | Freq: Once | INTRAVENOUS | Status: DC

## 2019-03-11 NOTE — ED Notes (Signed)
Pt did not answer for vitals.  

## 2019-03-11 NOTE — ED Triage Notes (Signed)
Pt reports Emesis since last Tuesday, states she has felt disoriented but is clearly oriented X4 in triage.  Pt states that it feels like her "sugar is up."  Pt is a diabetic.

## 2019-04-24 MED FILL — HYDROCHLOROTHIAZIDE 25 MG T: 25 | 30 days supply | Qty: 30 | Fill #1

## 2019-04-24 MED FILL — LISINOPRIL 10 MG TABS: 10 | 30 days supply | Qty: 30 | Fill #1

## 2019-04-24 MED FILL — LANTUS SOLOSTAR 100 UNITS/M: 100 | 25 days supply | Qty: 3 | Fill #1

## 2019-05-03 MED FILL — LISINOPRIL 10 MG TABS: 10 | 30 days supply | Qty: 30 | Fill #1

## 2019-05-03 MED FILL — LANTUS SOLOSTAR 100 UNITS/M: 100 | 25 days supply | Qty: 3 | Fill #1

## 2019-05-03 MED FILL — HYDROCHLOROTHIAZIDE 25 MG T: 25 | 30 days supply | Qty: 30 | Fill #1

## 2019-06-19 ENCOUNTER — Encounter (HOSPITAL_COMMUNITY): Payer: Self-pay

## 2019-06-19 ENCOUNTER — Other Ambulatory Visit: Payer: Self-pay

## 2019-06-19 ENCOUNTER — Emergency Department (HOSPITAL_COMMUNITY)
Admission: EM | Admit: 2019-06-19 | Discharge: 2019-06-19 | Disposition: A | Discharge status: Home / Self Care | Payer: Self-pay | Attending: Emergency Medicine | Admitting: Emergency Medicine

## 2019-06-19 DIAGNOSIS — I10 Essential (primary) hypertension: Secondary | ICD-10-CM | POA: Insufficient documentation

## 2019-06-19 DIAGNOSIS — Z79899 Other long term (current) drug therapy: Secondary | ICD-10-CM | POA: Insufficient documentation

## 2019-06-19 DIAGNOSIS — Z794 Long term (current) use of insulin: Secondary | ICD-10-CM | POA: Insufficient documentation

## 2019-06-19 DIAGNOSIS — R112 Nausea with vomiting, unspecified: Secondary | ICD-10-CM | POA: Insufficient documentation

## 2019-06-19 DIAGNOSIS — R739 Hyperglycemia, unspecified: Secondary | ICD-10-CM

## 2019-06-19 DIAGNOSIS — F17210 Nicotine dependence, cigarettes, uncomplicated: Secondary | ICD-10-CM | POA: Insufficient documentation

## 2019-06-19 DIAGNOSIS — E1165 Type 2 diabetes mellitus with hyperglycemia: Secondary | ICD-10-CM | POA: Insufficient documentation

## 2019-06-19 DIAGNOSIS — R109 Unspecified abdominal pain: Secondary | ICD-10-CM | POA: Insufficient documentation

## 2019-06-19 LAB — CBC WITH DIFFERENTIAL/PLATELET
Abs Immature Granulocytes: 0.05 10*3/uL (ref 0.00–0.07)
Basophils Absolute: 0 10*3/uL (ref 0.0–0.1)
Basophils Relative: 0 %
Eosinophils Absolute: 0 10*3/uL (ref 0.0–0.5)
Eosinophils Relative: 0 %
HCT: 45.2 % (ref 36.0–46.0)
Hemoglobin: 15.1 g/dL — ABNORMAL HIGH (ref 12.0–15.0)
Immature Granulocytes: 0 %
Lymphocytes Relative: 11 %
Lymphs Abs: 1.7 10*3/uL (ref 0.7–4.0)
MCH: 28 pg (ref 26.0–34.0)
MCHC: 33.4 g/dL (ref 30.0–36.0)
MCV: 83.9 fL (ref 80.0–100.0)
Monocytes Absolute: 0.7 10*3/uL (ref 0.1–1.0)
Monocytes Relative: 5 %
Neutro Abs: 13.1 10*3/uL — ABNORMAL HIGH (ref 1.7–7.7)
Neutrophils Relative %: 84 %
Platelets: 294 10*3/uL (ref 150–400)
RBC: 5.39 MIL/uL — ABNORMAL HIGH (ref 3.87–5.11)
RDW: 14.3 % (ref 11.5–15.5)
WBC: 15.7 10*3/uL — ABNORMAL HIGH (ref 4.0–10.5)
nRBC: 0 % (ref 0.0–0.2)

## 2019-06-19 LAB — URINALYSIS, ROUTINE W REFLEX MICROSCOPIC
Bilirubin Urine: NEGATIVE
Glucose, UA: >=500 mg/dL — AB
Hgb urine dipstick: NEGATIVE
Ketones, ur: 5 mg/dL — AB
Leukocytes,Ua: NEGATIVE
Nitrite: NEGATIVE
Protein, ur: >=300 mg/dL — AB
Specific Gravity, Urine: 1.024 (ref 1.005–1.030)
pH: 6 (ref 5.0–8.0)

## 2019-06-19 LAB — COMPREHENSIVE METABOLIC PANEL
ALT: 16 U/L (ref 0–44)
AST: 20 U/L (ref 15–41)
Albumin: 4.7 g/dL (ref 3.5–5.0)
Alkaline Phosphatase: 86 U/L (ref 38–126)
Anion gap: 15 (ref 5–15)
BUN: 19 mg/dL (ref 6–20)
CO2: 26 mmol/L (ref 22–32)
Calcium: 9.7 mg/dL (ref 8.9–10.3)
Chloride: 94 mmol/L — ABNORMAL LOW (ref 98–111)
Creatinine, Ser: 1.11 mg/dL — ABNORMAL HIGH (ref 0.44–1.00)
GFR calc Af Amer: >60 mL/min (ref >60)
GFR calc non Af Amer: 57 mL/min — ABNORMAL LOW (ref >60)
Glucose, Bld: 322 mg/dL — ABNORMAL HIGH (ref 70–99)
Potassium: 3.5 mmol/L (ref 3.5–5.1)
Sodium: 135 mmol/L (ref 135–145)
Total Bilirubin: 0.8 mg/dL (ref 0.3–1.2)
Total Protein: 9.4 g/dL — ABNORMAL HIGH (ref 6.5–8.1)

## 2019-06-19 LAB — CBG MONITORING, ED
Glucose-Capillary: 315 mg/dL — ABNORMAL HIGH (ref 70–99)
Glucose-Capillary: 269 mg/dL — ABNORMAL HIGH (ref 70–99)
Glucose-Capillary: 220 mg/dL — ABNORMAL HIGH (ref 70–99)

## 2019-06-19 LAB — LIPASE, BLOOD: Lipase: 45 U/L (ref 11–51)

## 2019-06-19 LAB — I-STAT BETA HCG BLOOD, ED (MC, WL, AP ONLY): I-stat hCG, quantitative: <5 m[IU]/mL (ref <5)

## 2019-06-19 MED ORDER — FAMOTIDINE IN NACL 20-0.9 MG/50ML-% IV SOLN
20.0000 mg | Freq: Once | INTRAVENOUS | Status: AC
  Administered 2019-06-19: 20 mg via INTRAVENOUS
  Filled 2019-06-19: qty 50

## 2019-06-19 MED ORDER — ONDANSETRON HCL 4 MG/2ML IJ SOLN
4.0000 mg | Freq: Once | INTRAMUSCULAR | Status: AC
  Administered 2019-06-19: 4 mg via INTRAVENOUS
  Filled 2019-06-19: qty 2

## 2019-06-19 MED ORDER — HYDROCHLOROTHIAZIDE 12.5 MG PO CAPS
25.0000 mg | ORAL_CAPSULE | Freq: Once | ORAL | Status: AC
  Administered 2019-06-19: 25 mg via ORAL
  Filled 2019-06-19: qty 2

## 2019-06-19 MED ORDER — POTASSIUM CHLORIDE ER 10 MEQ PO TBCR: 10.0000 meq | EXTENDED_RELEASE_TABLET | Freq: Every day | ORAL | 0 refills | Status: DC

## 2019-06-19 MED ORDER — LISINOPRIL 10 MG PO TABS
10.0000 mg | ORAL_TABLET | Freq: Once | ORAL | Status: AC
  Administered 2019-06-19: 15:00:00 10 mg via ORAL
  Filled 2019-06-19: qty 1

## 2019-06-19 MED ORDER — INSULIN ASPART 100 UNIT/ML ~~LOC~~ SOLN
5.0000 [IU] | Freq: Once | SUBCUTANEOUS | Status: AC
  Administered 2019-06-19: 5 [IU] via SUBCUTANEOUS
  Filled 2019-06-19: qty 0.05

## 2019-06-19 MED ORDER — ONDANSETRON 4 MG PO TBDP: ORAL_TABLET | ORAL | 0 refills | Status: DC

## 2019-06-19 MED ORDER — SODIUM CHLORIDE 0.9 % IV BOLUS
500.0000 mL | Freq: Once | INTRAVENOUS | Status: AC
  Administered 2019-06-19: 500 mL via INTRAVENOUS

## 2019-06-19 NOTE — ED Notes (Signed)
An After Visit Summary was printed and given to the patient. Discharge instructions given and no further questions at this time. Pt educated to take BP meds as prescribed.

## 2019-06-19 NOTE — ED Triage Notes (Signed)
Pt states she ate at a food truck Saturday, woke up Sunday morning with abdominal pain and N/V. Pt was not able to keep anything down Sunday and this morning. Pt states the abdominal pain is better today, still c/o of nausea.

## 2019-06-19 NOTE — ED Notes (Signed)
Benedetto Goad, PA aware patients BP 249/98

## 2019-06-19 NOTE — Discharge Instructions (Addendum)
Use Zofran as needed for nausea and vomiting.  I suspect you likely have some foodborne illness.  Maintain a bland diet for the next few days.  Your blood sugar was high today, please make sure you are taking your diabetes medications regularly.  Your blood pressure is very elevated today.  It is important that you are taking your blood pressure medications.  Please increase your HCTZ to 2 tablets daily, begin taking a potassium tablet daily as well.  It is very important that you follow-up closely with primary care doctor.  Long-term uncontrolled hypertension can lead to stroke, heart disease and worsening kidney disease requiring dialysis.

## 2019-06-19 NOTE — ED Provider Notes (Signed)
[facility] DEPT Provider Note   CSN: [id number] Arrival date & time: 06/19/19  1305     History Chief Complaint  Patient presents with  . Abdominal Pain  . Nausea    Hannah Dougherty is a 49 y.o. female.  Hannah Dougherty is a 49 y.o. female with a history of hypertension, and diabetes, who presents for evaluation of nausea, vomiting and abdominal pain.  Patient states that Saturday night she ate at a food truck and woke up Sunday morning with persistent nausea, vomiting and abdominal discomfort.  She states she is also had a few episodes of nonbloody diarrhea.  She has not been able to keep anything down throughout the day on Sunday.  Patient denies associated fevers or chills.  States abdominal pain is a generalized cramping sensation that primarily occurs before an episode of vomiting.  She states that she was worried her blood sugar was then to get out of control while she was having the symptoms.  Patient also noted to be very hypertensive on arrival, denies any associated chest pain, shortness of breath, headaches, vision changes, numbness weakness or tingling.  After talking more with the patient she states that she has not taken her blood pressure medication in over a month because she did not feel that it was helping her.  She is supposed to be on 10 mg of lisinopril and 25 mg of HCTZ, she states even when she was taking this her blood pressure was still elevated.  She states at one point she doubled up on the HCTZ and that was the only time she saw improvement in her blood pressure.        Past Medical History:  Diagnosis Date  . Diabetes mellitus without complication ([place])   . Hypertension     Patient Active Problem List   Diagnosis Date Noted  . Hypertension 12/28/2018  . Healthcare maintenance 12/14/2018  . Hyponatremia 12/10/2018  . Diabetes ([place]) 12/09/2018    Past Surgical History:  Procedure Laterality Date  . TUBAL LIGATION    . VOCAL  CORD LATERALIZATION, ENDOSCOPIC APPROACH W/ MLB       OB History   No obstetric history on file.     Family History  Problem Relation Age of Onset  . Hypertension Mother   . Diabetes Mother   . Kidney disease Mother   . Diabetes Father   . Hypertension Father   . Hypertension Maternal Grandmother   . Diabetes Maternal Grandmother   . Breast cancer Maternal Grandmother     Social History   Tobacco Use  . Smoking status: Current Every Day Smoker    Packs/day: 0.50    Types: Cigarettes  . Smokeless tobacco: Never Used  Substance Use Topics  . Alcohol use: Yes  . Drug use: Yes    Types: Marijuana    Home Medications Prior to Admission medications   Medication Sig Start Date End Date Taking? Authorizing Provider  hydrochlorothiazide (HYDRODIURIL) 25 MG tablet Take 25 mg by mouth daily.   Yes [provider]  insulin glargine (LANTUS SOLOSTAR) 100 UNIT/ML Solostar Pen Inject 12 Units into the skin at bedtime.   Yes [provider]  lisinopril (ZESTRIL) 10 MG tablet Take 10 mg by mouth daily.   Yes [provider]  metFORMIN (GLUCOPHAGE) 1000 MG tablet Take 1,000 mg by mouth 2 (two) times daily with a meal.   Yes [provider]  canagliflozin (INVOKANA) 100 MG TABS tablet Take 1 tablet (  100 mg total) by mouth daily before breakfast. Patient not taking: Reported on 06/19/2019 12/28/18   Ina Homes, MD  glucose blood (RELION CONFIRM/MICRO TEST) test strip Use as instructed 12/11/18   Asencion Noble, MD  hydrochlorothiazide (HYDRODIURIL) 25 MG tablet Take 1 tablet (25 mg total) by mouth daily. 02/24/19 04/25/19  Aldine Contes, MD  ibuprofen (ADVIL,MOTRIN) 600 MG tablet Take 1 tablet (600 mg total) by mouth every 8 (eight) hours as needed for moderate pain. Patient not taking: Reported on 12/09/2018 10/20/16   Dorie Rank, MD  Insulin Glargine (LANTUS SOLOSTAR) 100 UNIT/ML Solostar Pen Inject 12 Units into the skin daily at 10 pm. 02/24/19  05/25/19  Aldine Contes, MD  Insulin Pen Needle (PEN NEEDLES) 31G X 5 MM MISC 1 Dose by Does not apply route 4 (four) times daily -  before meals and at bedtime. 12/20/18   Chundi, Verne Spurr, MD  lisinopril (ZESTRIL) 10 MG tablet Take 1 tablet (10 mg total) by mouth daily. 02/24/19 04/25/19  Aldine Contes, MD  metFORMIN (GLUCOPHAGE) 1000 MG tablet Take 1 tablet (1,000 mg total) by mouth 2 (two) times daily. 12/14/18 03/14/19  Lars Mage, MD  ondansetron (ZOFRAN ODT) 4 MG disintegrating tablet 4mg  ODT q4 hours prn nausea/vomit 06/19/19   Jacqlyn Larsen, PA-C  potassium chloride (KLOR-CON) 10 MEQ tablet Take 1 tablet (10 mEq total) by mouth daily. 06/19/19   Jacqlyn Larsen, PA-C  ReliOn Lancet Devices 30G MISC 1 each by Does not apply route 3 (three) times daily. 12/11/18   Asencion Noble, MD    Allergies    Patient has no known allergies.  Review of Systems   Review of Systems  Constitutional: Negative for chills and fever.  HENT: Negative.   Respiratory: Negative for cough and shortness of breath.   Cardiovascular: Negative for chest pain.  Gastrointestinal: Positive for abdominal pain, diarrhea, nausea and vomiting. Negative for blood in stool and constipation.  Genitourinary: Negative for dysuria and frequency.  Musculoskeletal: Negative for arthralgias and myalgias.  Skin: Negative for color change and rash.  Neurological: Negative for dizziness, syncope and light-headedness.    Physical Exam Updated Vital Signs BP (!) 241/97 (BP Location: Left Arm)   Pulse 64   Temp 98.2 F (36.8 C) (Oral)   Resp 18   LMP 10/28/2016 (Within Weeks)   SpO2 99%   Physical Exam Vitals and nursing note reviewed.  Constitutional:      General: She is not in acute distress.    Appearance: She is well-developed. She is not diaphoretic.     Comments: Appears uncomfortable but is in no acute distress  HENT:     Head: Normocephalic and atraumatic.     Mouth/Throat:     Comments: Mucous  membranes slightly dry Eyes:     General:        Right eye: No discharge.        Left eye: No discharge.     Pupils: Pupils are equal, round, and reactive to light.  Cardiovascular:     Rate and Rhythm: Normal rate and regular rhythm.     Heart sounds: Normal heart sounds. No murmur. No friction rub.  Pulmonary:     Effort: Pulmonary effort is normal. No respiratory distress.     Breath sounds: Normal breath sounds. No wheezing or rales.     Comments: Respirations equal and unlabored, patient able to speak in full sentences, lungs clear to auscultation bilaterally Abdominal:     General: Bowel  sounds are normal. There is no distension.     Palpations: Abdomen is soft. There is no mass.     Tenderness: There is abdominal tenderness. There is no guarding.     Comments: Soft, nondistended, bowel throughout, there is some tenderness exam, tenderness does not localize, no guarding rebound tenderness  Musculoskeletal:        General: No deformity.     Cervical back: Neck supple.  Skin:    General: Skin is warm and dry.     Capillary Refill: Capillary refill takes less than 2 seconds.  Neurological:     Mental Status: She is alert.     Coordination: Coordination normal.     Comments: Speech is clear, able to follow commands Moves extremities without ataxia, coordination intact  Psychiatric:        Mood and Affect: Mood normal.        Behavior: Behavior normal.     ED Results / Procedures / Treatments   Labs (all labs ordered are listed, but only abnormal results are displayed) Labs Reviewed  COMPREHENSIVE METABOLIC PANEL - Abnormal; Notable for the following components:      Result Value   Chloride 94 (*)    Glucose, Bld 322 (*)    Creatinine, Ser 1.11 (*)    Total Protein 9.4 (*)    GFR calc non Af Amer 57 (*)    All other components within normal limits  CBC WITH DIFFERENTIAL/PLATELET - Abnormal; Notable for the following components:   WBC 15.7 (*)    RBC 5.39 (*)     Hemoglobin 15.1 (*)    Neutro Abs 13.1 (*)    All other components within normal limits  URINALYSIS, ROUTINE W REFLEX MICROSCOPIC - Abnormal; Notable for the following components:   Glucose, UA >=500 (*)    Ketones, ur 5 (*)    Protein, ur >=300 (*)    Bacteria, UA RARE (*)    All other components within normal limits  CBG MONITORING, ED - Abnormal; Notable for the following components:   Glucose-Capillary 315 (*)    All other components within normal limits  CBG MONITORING, ED - Abnormal; Notable for the following components:   Glucose-Capillary 269 (*)    All other components within normal limits  LIPASE, BLOOD  I-STAT BETA HCG BLOOD, ED ([facility], [facility], [facility] ONLY)  CBG MONITORING, ED    EKG None  Radiology No results found.  Procedures Procedures (including critical care time)  Medications Ordered in ED Medications  sodium chloride 0.9 % bolus 500 mL (0 mLs Intravenous Stopped 06/19/19 1436)  ondansetron (ZOFRAN) injection 4 mg (4 mg Intravenous Given 06/19/19 1356)  famotidine (PEPCID) IVPB 20 mg premix (0 mg Intravenous Stopped 06/19/19 1437)  sodium chloride 0.9 % bolus 500 mL (0 mLs Intravenous Stopped 06/19/19 1714)  hydrochlorothiazide (MICROZIDE) capsule 25 mg (25 mg Oral Given 06/19/19 1436)  lisinopril (ZESTRIL) tablet 10 mg (10 mg Oral Given 06/19/19 1436)  insulin aspart (novoLOG) injection 5 Units (5 Units Subcutaneous Given 06/19/19 1714)  hydrochlorothiazide (MICROZIDE) capsule 25 mg (25 mg Oral Given 06/19/19 1812)    ED Course  I have reviewed the triage vital signs and the nursing notes.  Pertinent labs & imaging results that were available during my care of the patient were reviewed by me and considered in my medical decision making (see chart for details).    MDM Rules/Calculators/A&P  49 year old female presents with nausea, vomiting, diarrhea and abdominal pain that began after she ate at a food truck over the weekend, since then she is not been able  to keep anything down.  Abdomen with generalized tenderness on exam.  Suspect gastroenteritis, patient is also diabetic some concern for potential DKA.  Will get labs and give IV fluids Zofran and Pepcid.  Will also give patient dose of home blood pressure medication.  I have ordered, reviewed and interpreted all lab work.  Evaluation overall reassuring.  Does show hyperglycemia of 322, but without evidence of DKA, normal anion gap and bicarb.  Creatinine is at baseline.  Leukocytosis of 15.7, likely in the setting of 24 hours of vomiting, again abdominal exam is nonfocal.  Lipase is not elevated.  Urinalysis with only rare bacteria and patient is not having any urinary symptoms.  On reevaluation patient is feeling much better with no further vomiting, she has been able to keep down p.o. fluids here in the ED.  Patient's blood pressure has improved slightly.  Given that she has been off of her meds for a month I expect this will take time to improve, but she does state that she was having difficulty controlling her blood pressure on this regimen, will increase HCTZ and place patient on potassium supplement and have her follow-up closely with PCP.  Case management consult placed for help with PCP follow-up.  At this time patient is stable for discharge home with Zofran and Pepcid, she has blood pressure medications at home already, was also prescribed potassium supplementation.  Final Clinical Impression(s) / ED Diagnoses Final diagnoses:  Non-intractable vomiting with nausea, unspecified vomiting type  Hyperglycemia  Hypertension, unspecified type    Rx / DC Orders ED Discharge Orders         Ordered    potassium chloride (KLOR-CON) 10 MEQ tablet  Daily     06/19/19 1804    ondansetron (ZOFRAN ODT) 4 MG disintegrating tablet     06/19/19 1806           Janet Berlin 06/23/19 2207    Sherwood Gambler, MD 06/26/19 1711

## 2019-06-19 NOTE — ED Notes (Signed)
Pt ambulated to restroom, pt aware urine sample is needed.

## 2019-06-19 NOTE — ED Notes (Signed)
Pt has hx of hypertension and did not take prescribed BP meds today due to nausea.

## 2019-06-19 NOTE — ED Notes (Signed)
Benedetto Goad, PA aware patients BP 255/100

## 2019-06-20 NOTE — Progress Notes (Addendum)
06/20/2019 2:59 pm TOC CM referral received to assist pt with follow with PCP for HTN and DM. Spoke to pt and states she is unable to afford copay cost at her current PCP. Appt arranged with [facility] for 06/28/2019 at 3:30 pm. Information provided to pt. Pt states she has blood pressure medication and DM at home. ED provider updated. [place], [place] ED TOC CM [phone]

## 2019-06-28 ENCOUNTER — Ambulatory Visit (INDEPENDENT_AMBULATORY_CARE_PROVIDER_SITE_OTHER): Payer: Self-pay | Admitting: Primary Care

## 2019-06-28 ENCOUNTER — Encounter (INDEPENDENT_AMBULATORY_CARE_PROVIDER_SITE_OTHER): Payer: Self-pay | Admitting: Primary Care

## 2019-06-28 ENCOUNTER — Other Ambulatory Visit: Payer: Self-pay

## 2019-06-28 VITALS — BP 222/89 | HR 68 | Temp 97.3°F | Ht 65.0 in | Wt 192.2 lb

## 2019-06-28 DIAGNOSIS — Z794 Long term (current) use of insulin: Secondary | ICD-10-CM

## 2019-06-28 DIAGNOSIS — I10 Essential (primary) hypertension: Secondary | ICD-10-CM

## 2019-06-28 DIAGNOSIS — E119 Type 2 diabetes mellitus without complications: Secondary | ICD-10-CM

## 2019-06-28 LAB — GLUCOSE, POCT (MANUAL RESULT ENTRY): POC Glucose: 180 mg/dl — AB (ref 70–99)

## 2019-06-28 LAB — POCT GLYCOSYLATED HEMOGLOBIN (HGB A1C): Hemoglobin A1C: 9.6 % — AB (ref 4.0–5.6)

## 2019-06-28 MED ORDER — CHLORTHALIDONE 50 MG PO TABS: 50.0000 mg | ORAL_TABLET | Freq: Every day | ORAL | 1 refills | Status: DC

## 2019-06-28 MED ORDER — JANUMET 50-1000 MG PO TABS: 1.0000 | ORAL_TABLET | Freq: Two times a day (BID) | ORAL | 1 refills | Status: DC

## 2019-06-28 MED ORDER — LISINOPRIL 20 MG PO TABS: 20.0000 mg | ORAL_TABLET | Freq: Every day | ORAL | 3 refills | Status: DC

## 2019-06-28 MED ORDER — HYDRALAZINE HCL 50 MG PO TABS: 25.0000 mg | ORAL_TABLET | Freq: Three times a day (TID) | ORAL | 1 refills | Status: DC

## 2019-06-28 MED ORDER — AMLODIPINE BESYLATE 10 MG PO TABS: 10.0000 mg | ORAL_TABLET | Freq: Every day | ORAL | 3 refills | Status: DC

## 2019-06-28 MED FILL — LISINOPRIL 20 MG TABLET: 20 | 30 days supply | Qty: 30 | Fill #0

## 2019-06-28 MED FILL — AMLODIPINE BESYLATE 10 MG T: 10 | 30 days supply | Qty: 30 | Fill #0

## 2019-06-28 MED FILL — CHLORTHALIDONE 50 MG TABS: 50 | 30 days supply | Qty: 30 | Fill #0

## 2019-06-28 MED FILL — JANUMET 50-1,000 MG TABLET: 50-1000 | 30 days supply | Qty: 60 | Fill #0

## 2019-06-28 MED FILL — hydrALAZINE HCL 50 MG TABS: 50 | 30 days supply | Qty: 45 | Fill #0

## 2019-06-28 NOTE — Patient Instructions (Signed)

## 2019-06-28 NOTE — Progress Notes (Signed)
Established Patient Office Visit  Subjective:  Patient ID: Hannah Dougherty, female    DOB: Apr 06, 1970  Age: 49 y.o. MRN: [id number]  CC:  Chief Complaint  Patient presents with  . New Patient (Initial Visit)    HPI Hannah Dougherty presents for establishment of care and she voices concern about elevated blood pressure she presents with readings of 222/89 Denies shortness of breath, headaches, chest pain or lower extremity edema. Never has any symptoms. Diabetes uncontrolled A1C 9.6 6 months ago it was 11.4.   Past Medical History:  Diagnosis Date  . Diabetes mellitus without complication ([place])   . Hypertension     Past Surgical History:  Procedure Laterality Date  . TUBAL LIGATION    . VOCAL CORD LATERALIZATION, ENDOSCOPIC APPROACH W/ MLB      Family History  Problem Relation Age of Onset  . Hypertension Mother   . Diabetes Mother   . Kidney disease Mother   . Diabetes Father   . Hypertension Father   . Hypertension Maternal Grandmother   . Diabetes Maternal Grandmother   . Breast cancer Maternal Grandmother     Social History   Socioeconomic History  . Marital status: Single    Spouse name: Not on file  . Number of children: Not on file  . Years of education: Not on file  . Highest education level: Not on file  Occupational History  . Not on file  Tobacco Use  . Smoking status: Current Every Day Smoker    Packs/day: 0.50    Types: Cigarettes  . Smokeless tobacco: Never Used  Substance and Sexual Activity  . Alcohol use: Yes  . Drug use: Yes    Types: Marijuana  . Sexual activity: Not on file  Other Topics Concern  . Not on file  Social History Narrative  . Not on file   Social Determinants of Health   Financial Resource Strain:   . Difficulty of Paying Living Expenses:   Food Insecurity:   . Worried About Charity fundraiser in the Last Year:   . Arboriculturist in the Last Year:   Transportation Needs:   . Film/video editor (Medical):   Marland Kitchen Lack  of Transportation (Non-Medical):   Physical Activity:   . Days of Exercise per Week:   . Minutes of Exercise per Session:   Stress:   . Feeling of Stress :   Social Connections:   . Frequency of Communication with Friends and Family:   . Frequency of Social Gatherings with Friends and Family:   . Attends Religious Services:   . Active Member of Clubs or Organizations:   . Attends Archivist Meetings:   Marland Kitchen Marital Status:   Intimate Partner Violence:   . Fear of Current or Ex-Partner:   . Emotionally Abused:   Marland Kitchen Physically Abused:   . Sexually Abused:     Outpatient Medications Prior to Visit  Medication Sig Dispense Refill  . glucose blood (RELION CONFIRM/MICRO TEST) test strip Use as instructed 100 each 0  . insulin glargine (LANTUS SOLOSTAR) 100 UNIT/ML Solostar Pen Inject 16 Units into the skin at bedtime.    . Insulin Pen Needle (PEN NEEDLES) 31G X 5 MM MISC 1 Dose by Does not apply route 4 (four) times daily -  before meals and at bedtime. 100 each 1  . potassium chloride (KLOR-CON) 10 MEQ tablet Take 1 tablet (10 mEq total) by mouth daily. 30 tablet 0  . ReliOn  Lancet Devices 30G MISC 1 each by Does not apply route 3 (three) times daily. 100 each 0  . hydrochlorothiazide (HYDRODIURIL) 25 MG tablet Take 25 mg by mouth daily.    Marland Kitchen lisinopril (ZESTRIL) 10 MG tablet Take 10 mg by mouth daily.    . metFORMIN (GLUCOPHAGE) 1000 MG tablet Take 1,000 mg by mouth 2 (two) times daily with a meal.    . canagliflozin (INVOKANA) 100 MG TABS tablet Take 1 tablet (100 mg total) by mouth daily before breakfast. (Patient not taking: Reported on 06/19/2019) 30 tablet 3  . hydrochlorothiazide (HYDRODIURIL) 25 MG tablet Take 1 tablet (25 mg total) by mouth daily. 30 tablet 5  . ibuprofen (ADVIL,MOTRIN) 600 MG tablet Take 1 tablet (600 mg total) by mouth every 8 (eight) hours as needed for moderate pain. (Patient not taking: Reported on 12/09/2018) 21 tablet 0  . Insulin Glargine (LANTUS  SOLOSTAR) 100 UNIT/ML Solostar Pen Inject 12 Units into the skin daily at 10 pm. 5 pen 5  . lisinopril (ZESTRIL) 10 MG tablet Take 1 tablet (10 mg total) by mouth daily. 30 tablet 5  . metFORMIN (GLUCOPHAGE) 1000 MG tablet Take 1 tablet (1,000 mg total) by mouth 2 (two) times daily. 180 tablet 1  . ondansetron (ZOFRAN ODT) 4 MG disintegrating tablet 68m ODT q4 hours prn nausea/vomit 10 tablet 0   No facility-administered medications prior to visit.    No Known Allergies  ROS Review of Systems  Endocrine: Positive for polydipsia, polyphagia and polyuria.  All other systems reviewed and are negative.     Objective:    Physical Exam  Constitutional: She is oriented to person, place, and time. She appears well-developed and well-nourished.  HENT:  Head: Normocephalic.  Eyes: Pupils are equal, round, and reactive to light. EOM are normal.  Cardiovascular: Normal rate and regular rhythm.  Pulmonary/Chest: Effort normal and breath sounds normal.  Abdominal: Soft. Bowel sounds are normal.  Musculoskeletal:        General: Normal range of motion.     Cervical back: Normal range of motion and neck supple.  Neurological: She is alert and oriented to person, place, and time. She has normal reflexes.  Skin: Skin is warm and dry.  Psychiatric: She has a normal mood and affect. Her behavior is normal. Judgment and thought content normal.    BP (!) 222/89 (BP Location: Right Arm, Patient Position: Sitting, Cuff Size: Large)   Pulse 68   Temp (!) 97.3 F (36.3 C) (Temporal)   Ht '5\' 5"'  (1.651 m)   Wt 192 lb 3.2 oz (87.2 kg)   LMP 10/28/2016 (Within Weeks)   SpO2 100%   BMI 31.98 kg/m  Wt Readings from Last 3 Encounters:  06/28/19 192 lb 3.2 oz (87.2 kg)  12/28/18 211 lb (95.7 kg)  12/14/18 209 lb (94.8 kg)     Health Maintenance Due  Topic Date Due  . PNEUMOCOCCAL POLYSACCHARIDE VACCINE AGE 15-64 HIGH RISK  Never done  . FOOT EXAM  Never done  . OPHTHALMOLOGY EXAM  Never done  .  URINE MICROALBUMIN  Never done  . PAP SMEAR-Modifier  Never done  . MAMMOGRAM  Never done  . COLONOSCOPY  Never done    There are no preventive care reminders to display for this patient.  No results found for: TSH Lab Results  Component Value Date   WBC 15.7 (H) 06/19/2019   HGB 15.1 (H) 06/19/2019   HCT 45.2 06/19/2019   MCV 83.9 06/19/2019  PLT 294 06/19/2019   Lab Results  Component Value Date   NA 135 06/19/2019   K 3.5 06/19/2019   CO2 26 06/19/2019   GLUCOSE 322 (H) 06/19/2019   BUN 19 06/19/2019   CREATININE 1.11 (H) 06/19/2019   BILITOT 0.8 06/19/2019   ALKPHOS 86 06/19/2019   AST 20 06/19/2019   ALT 16 06/19/2019   PROT 9.4 (H) 06/19/2019   ALBUMIN 4.7 06/19/2019   CALCIUM 9.7 06/19/2019   ANIONGAP 15 06/19/2019   No results found for: CHOL No results found for: HDL No results found for: LDLCALC No results found for: TRIG No results found for: CHOLHDL Lab Results  Component Value Date   HGBA1C 9.6 (A) 06/28/2019      Assessment & Plan:  [name] was seen today for new patient (initial visit).  Diagnoses and all orders for this visit: Type 2 diabetes mellitus without complication, with long-term current use of insulin (HCC) Medication changed increased Lantus 16 units at bedtime added janumet 50/1000 twice daily. The  goal for glycemic control related to A1c measurements: Decrease foods that are high in carbohydrates are the following rice, potatoes, breads, sugars, and pastas.  Reduction in the intake (eating) will assist in lowering your blood sugars. -     HgB A1c 9.2  -     Glucose (CBG) -     CMP14+EGFR -     Lipid Panel -     CBC with Differential  Essential hypertension Uncontrolled systolic hypertension changed medication regiment .Counseled on blood pressure goal of less than 130/80, low-sodium, DASH diet, medication compliance, 150 minutes of moderate intensity exercise per week. Discussed medication compliance, adverse effects.     hydrALAZINE (APRESOLINE) 50 MG tablet; Take 0.5 tablets (25 mg total) by mouth 3 (three) times daily. -     lisinopril (ZESTRIL) 20 MG tablet; Take 1 tablet (20 mg total) by mouth daily. -     amLODipine (NORVASC) 10 MG tablet; Take 1 tablet (10 mg total) by mouth daily. -     chlorthalidone (HYGROTON) 50 MG tablet; Take 1 tablet (50 mg total) by mouth daily. -     CBC with Differential  Class 2 severe obesity due to excess calories with serious comorbidity in adult, unspecified BMI ([place]) Obesity is 30-39 indicating an excess in caloric intake or underlining conditions. This may lead to other co-morbidities. Examples CVD, respiratory complications Lifestyle modifications of diet and exercise may reduce obesity.  -     Lipid Panel -     TSH + free T4  Other orders -     hydrALAZINE (APRESOLINE) 50 MG tablet; Take 0.5 tablets (25 mg total) by mouth 3 (three) times daily. -     lisinopril (ZESTRIL) 20 MG tablet; Take 1 tablet (20 mg total) by mouth daily. -     amLODipine (NORVASC) 10 MG tablet; Take 1 tablet (10 mg total) by mouth daily. -     chlorthalidone (HYGROTON) 50 MG tablet; Take 1 tablet (50 mg total) by mouth daily. -     sitaGLIPtin-metformin (JANUMET) 50-1000 MG tablet; Take 1 tablet by mouth 2 (two) times daily with a meal.   Meds ordered this encounter  Medications  . hydrALAZINE (APRESOLINE) 50 MG tablet    Sig: Take 0.5 tablets (25 mg total) by mouth 3 (three) times daily.    Dispense:  90 tablet    Refill:  1  . lisinopril (ZESTRIL) 20 MG tablet    Sig: Take 1 tablet (  20 mg total) by mouth daily.    Dispense:  90 tablet    Refill:  3  . amLODipine (NORVASC) 10 MG tablet    Sig: Take 1 tablet (10 mg total) by mouth daily.    Dispense:  90 tablet    Refill:  3  . chlorthalidone (HYGROTON) 50 MG tablet    Sig: Take 1 tablet (50 mg total) by mouth daily.    Dispense:  30 tablet    Refill:  1  . sitaGLIPtin-metformin (JANUMET) 50-1000 MG tablet    Sig: Take 1 tablet by  mouth 2 (two) times daily with a meal.    Dispense:  60 tablet    Refill:  1    Follow-up: Return in about 6 weeks (around 08/09/2019) for Bp re check in person.[name], NP

## 2019-06-29 LAB — CBC WITH DIFFERENTIAL/PLATELET
Basophils Absolute: 0.1 10*3/uL (ref 0.0–0.2)
Basos: 1 %
EOS (ABSOLUTE): 0.2 10*3/uL (ref 0.0–0.4)
Eos: 2 %
Hematocrit: 41.3 % (ref 34.0–46.6)
Hemoglobin: 13 g/dL (ref 11.1–15.9)
Immature Grans (Abs): 0 10*3/uL (ref 0.0–0.1)
Immature Granulocytes: 0 %
Lymphocytes Absolute: 4 10*3/uL — ABNORMAL HIGH (ref 0.7–3.1)
Lymphs: 43 %
MCH: 27.3 pg (ref 26.6–33.0)
MCHC: 31.5 g/dL (ref 31.5–35.7)
MCV: 87 fL (ref 79–97)
Monocytes Absolute: 0.6 10*3/uL (ref 0.1–0.9)
Monocytes: 7 %
Neutrophils Absolute: 4.4 10*3/uL (ref 1.4–7.0)
Neutrophils: 47 %
Platelets: 299 10*3/uL (ref 150–450)
RBC: 4.76 x10E6/uL (ref 3.77–5.28)
RDW: 13.1 % (ref 11.7–15.4)
WBC: 9.2 10*3/uL (ref 3.4–10.8)

## 2019-06-29 LAB — TSH+FREE T4
Free T4: 1.36 ng/dL (ref 0.82–1.77)
TSH: 0.604 u[IU]/mL (ref 0.450–4.500)

## 2019-06-29 LAB — CMP14+EGFR
ALT: 11 IU/L (ref 0–32)
AST: 14 IU/L (ref 0–40)
Albumin/Globulin Ratio: 1.3 (ref 1.2–2.2)
Albumin: 4 g/dL (ref 3.8–4.9)
Alkaline Phosphatase: 92 IU/L (ref 39–117)
BUN/Creatinine Ratio: 14 (ref 9–23)
BUN: 17 mg/dL (ref 6–24)
Bilirubin Total: 0.2 mg/dL (ref 0.0–1.2)
CO2: 28 mmol/L (ref 20–29)
Calcium: 9.7 mg/dL (ref 8.7–10.2)
Chloride: 92 mmol/L — ABNORMAL LOW (ref 96–106)
Creatinine, Ser: 1.19 mg/dL — ABNORMAL HIGH (ref 0.57–1.00)
GFR calc Af Amer: 61 mL/min/{1.73_m2} (ref >59)
GFR calc non Af Amer: 53 mL/min/{1.73_m2} — ABNORMAL LOW (ref >59)
Globulin, Total: 3.1 g/dL (ref 1.5–4.5)
Glucose: 172 mg/dL — ABNORMAL HIGH (ref 65–99)
Potassium: 4.1 mmol/L (ref 3.5–5.2)
Sodium: 132 mmol/L — ABNORMAL LOW (ref 134–144)
Total Protein: 7.1 g/dL (ref 6.0–8.5)

## 2019-06-29 LAB — LIPID PANEL
Chol/HDL Ratio: 4 ratio (ref 0.0–4.4)
Cholesterol, Total: 197 mg/dL (ref 100–199)
HDL: 49 mg/dL (ref >39)
LDL Chol Calc (NIH): 130 mg/dL — ABNORMAL HIGH (ref 0–99)
Triglycerides: 102 mg/dL (ref 0–149)
VLDL Cholesterol Cal: 18 mg/dL (ref 5–40)

## 2019-07-03 ENCOUNTER — Telehealth (INDEPENDENT_AMBULATORY_CARE_PROVIDER_SITE_OTHER): Payer: Self-pay

## 2019-07-03 NOTE — Telephone Encounter (Signed)
Sent to PCP ?

## 2019-07-03 NOTE — Telephone Encounter (Signed)
Patient called to inform that she has been vomiting, has diarrhea and headaches. Patient states she was not able to go to work due to the diarrhea. Patient states she has solid herself twice and its embarrassing. Patient does not know which medication and causing her to have these side affect. Patient states she is also filled with air and can feel the air come out. States it feels like when you drink a pepsi and you have to burp.  Please advice [phone]

## 2019-07-07 ENCOUNTER — Telehealth (INDEPENDENT_AMBULATORY_CARE_PROVIDER_SITE_OTHER): Payer: Self-pay

## 2019-07-07 NOTE — Telephone Encounter (Signed)
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 Try to decrease your fatty foods, red meat, cheese, milk and increase fiber like whole grains and veggies. You can also add a fiber supplement like Metamucil or Benefiber.

## 2019-07-07 NOTE — Telephone Encounter (Signed)
Patient verified date of birth. She is aware of decline in kidney function due to uncontrolled diabetes. advised patient to increase water intake and take diabetic medications as directed. Elevated cholesterol; pravastatin 20 mg added to medications to help lower cholesterol. Patient advised to decrease fatty food, red meat, cheese, milk; increase fiber with whole grains and veggies. She expressed understanding of all results. Nat Christen, CMA

## 2019-07-21 ENCOUNTER — Encounter: Payer: Self-pay | Admitting: *Deleted

## 2019-08-09 ENCOUNTER — Ambulatory Visit (INDEPENDENT_AMBULATORY_CARE_PROVIDER_SITE_OTHER): Payer: Self-pay | Admitting: Primary Care

## 2019-08-09 ENCOUNTER — Other Ambulatory Visit: Payer: Self-pay

## 2019-08-09 ENCOUNTER — Encounter (INDEPENDENT_AMBULATORY_CARE_PROVIDER_SITE_OTHER): Payer: Self-pay | Admitting: Primary Care

## 2019-08-09 VITALS — BP 115/76 | HR 65 | Temp 98.2°F | Ht 65.0 in | Wt 196.8 lb

## 2019-08-09 DIAGNOSIS — E782 Mixed hyperlipidemia: Secondary | ICD-10-CM

## 2019-08-09 DIAGNOSIS — Z0130 Encounter for examination of blood pressure without abnormal findings: Secondary | ICD-10-CM

## 2019-08-09 MED ORDER — PRAVASTATIN SODIUM 20 MG PO TABS: 20.0000 mg | ORAL_TABLET | Freq: Every day | ORAL | 1 refills | Status: DC

## 2019-08-09 NOTE — Patient Instructions (Signed)

## 2019-08-09 NOTE — Progress Notes (Signed)
Ms. Hannah Dougherty presents for  hypertension evaluation, on previous visit medication was adjusted to include amlodipine 10 mg, hydralazine 81m three times a day, Chlorothion 520mdaily , lisinopril 2089maily Patient reports adherence with medications.  Current Medication List Current Outpatient Medications on File Prior to Visit  Medication Sig Dispense Refill  . amLODipine (NORVASC) 10 MG tablet Take 1 tablet (10 mg total) by mouth daily. 90 tablet 3  . chlorthalidone (HYGROTON) 50 MG tablet Take 1 tablet (50 mg total) by mouth daily. 30 tablet 1  . glucose blood (RELION CONFIRM/MICRO TEST) test strip Use as instructed 100 each 0  . hydrALAZINE (APRESOLINE) 50 MG tablet Take 0.5 tablets (25 mg total) by mouth 3 (three) times daily. 90 tablet 1  . insulin glargine (LANTUS SOLOSTAR) 100 UNIT/ML Solostar Pen Inject 16 Units into the skin at bedtime.    . Insulin Pen Needle (PEN NEEDLES) 31G X 5 MM MISC 1 Dose by Does not apply route 4 (four) times daily -  before meals and at bedtime. 100 each 1  . lisinopril (ZESTRIL) 20 MG tablet Take 1 tablet (20 mg total) by mouth daily. 90 tablet 3  . potassium chloride (KLOR-CON) 10 MEQ tablet Take 1 tablet (10 mEq total) by mouth daily. 30 tablet 0  . ReliOn Lancet Devices 30G MISC 1 each by Does not apply route 3 (three) times daily. 100 each 0  . sitaGLIPtin-metformin (JANUMET) 50-1000 MG tablet Take 1 tablet by mouth 2 (two) times daily with a meal. 60 tablet 1   No current facility-administered medications on file prior to visit.   Past Medical History  Past Medical History:  Diagnosis Date  . Diabetes mellitus without complication ([place] . Hypertension     Dietary habits include: healthy diet with sodium and carb reduction Exercise habits include 30 mins walking daily Family / Social history: Father -CVA ASCVD risk factors include- CHAMali:  Physical Exam Vitals reviewed.  Constitutional:      Appearance: She is obese.  HENT:   Head: Normocephalic.  Cardiovascular:     Rate and Rhythm: Normal rate and regular rhythm.  Pulmonary:     Effort: Pulmonary effort is normal.     Breath sounds: Normal breath sounds.  Abdominal:     General: Bowel sounds are normal.  Musculoskeletal:        General: Normal range of motion.     Cervical back: Normal range of motion.  Skin:    General: Skin is warm and dry.  Neurological:     Mental Status: She is alert and oriented to person, place, and time.  Psychiatric:        Mood and Affect: Mood normal.        Behavior: Behavior normal.        Thought Content: Thought content normal.        Judgment: Judgment normal.      ROS  Last 3 Office BP readings: BP Readings from Last 3 Encounters:  08/09/19 115/76  06/28/19 (!) 222/89  06/19/19 (!) 200/115    BMET    Component Value Date/Time   NA 132 (L) 06/28/2019 1630   K 4.1 06/28/2019 1630   CL 92 (L) 06/28/2019 1630   CO2 28 06/28/2019 1630   GLUCOSE 172 (H) 06/28/2019 1630   GLUCOSE 322 (H) 06/19/2019 1325   BUN 17 06/28/2019 1630   CREATININE 1.19 (H) 06/28/2019 1630   CALCIUM 9.7 06/28/2019 1630   GFRNONAA 53 (  L) 06/28/2019 1630   GFRAA 61 06/28/2019 1630    Renal function: CrCl cannot be calculated (Patient's most recent lab result is older than the maximum 21 days allowed.).  Clinical ASCVD: Yes  The 10-year ASCVD risk score Mikey Bussing DC Jr., et al., 2013) is: 12.7%   Values used to calculate the score:     Age: 49 years     Sex: Female     Is Non-Hispanic African American: Yes     Diabetic: Yes     Tobacco smoker: Yes     Systolic Blood Pressure: 037 mmHg     Is BP treated: Yes     HDL Cholesterol: 49 mg/dL     Total Cholesterol: 197 mg/dL   A/P: Blood pressure check Hypertension longstanding  on current medications amlodipine 10 mg, hydralazine 3m three times a day, Chlorothion 562mdaily , lisinopril 2037maily. BP Goal is met < 130/80 today readings 115/76 mmHg. Patient is adherent with  current medications.  -Continued  -F/u labs ordered -  -Counseled on lifestyle modifications for blood pressure control including reduced dietary sodium, increased exercise, adequate sleep .MonBeckie Dougherty seen today for blood pressure check and medication refill.  Diagnoses and all orders for this visit:  Mixed hyperlipidemia Continue to decrease your fatty foods, red meat, cheese, milk and increase fiber like whole grains and veggies. You can also add a fiber supplement like Metamucil or Benefiber.  -     pravastatin (PRAVACHOL) 20 MG tablet; Take 1 tablet (20 mg total) by mouth daily.

## 2019-08-10 MED FILL — PRAVASTATIN SODIUM 20 MG TA: 20 | 30 days supply | Qty: 30 | Fill #0

## 2019-08-16 ENCOUNTER — Emergency Department (HOSPITAL_COMMUNITY)
Admission: EM | Admit: 2019-08-16 | Discharge: 2019-08-16 | Disposition: A | Discharge status: Home / Self Care | Payer: Self-pay | Attending: Emergency Medicine | Admitting: Emergency Medicine

## 2019-08-16 ENCOUNTER — Emergency Department (HOSPITAL_COMMUNITY): Payer: Self-pay

## 2019-08-16 ENCOUNTER — Encounter (HOSPITAL_COMMUNITY): Payer: Self-pay

## 2019-08-16 ENCOUNTER — Other Ambulatory Visit: Payer: Self-pay

## 2019-08-16 DIAGNOSIS — R739 Hyperglycemia, unspecified: Secondary | ICD-10-CM

## 2019-08-16 DIAGNOSIS — I160 Hypertensive urgency: Secondary | ICD-10-CM

## 2019-08-16 DIAGNOSIS — F17210 Nicotine dependence, cigarettes, uncomplicated: Secondary | ICD-10-CM | POA: Insufficient documentation

## 2019-08-16 DIAGNOSIS — R112 Nausea with vomiting, unspecified: Secondary | ICD-10-CM | POA: Insufficient documentation

## 2019-08-16 DIAGNOSIS — I10 Essential (primary) hypertension: Secondary | ICD-10-CM | POA: Insufficient documentation

## 2019-08-16 DIAGNOSIS — Z79899 Other long term (current) drug therapy: Secondary | ICD-10-CM | POA: Insufficient documentation

## 2019-08-16 DIAGNOSIS — E1165 Type 2 diabetes mellitus with hyperglycemia: Secondary | ICD-10-CM | POA: Insufficient documentation

## 2019-08-16 DIAGNOSIS — Z794 Long term (current) use of insulin: Secondary | ICD-10-CM | POA: Insufficient documentation

## 2019-08-16 LAB — CBC WITH DIFFERENTIAL/PLATELET
Abs Immature Granulocytes: 0.04 10*3/uL (ref 0.00–0.07)
Basophils Absolute: 0 10*3/uL (ref 0.0–0.1)
Basophils Relative: 0 %
Eosinophils Absolute: 0 10*3/uL (ref 0.0–0.5)
Eosinophils Relative: 0 %
HCT: 38.4 % (ref 36.0–46.0)
Hemoglobin: 12.2 g/dL (ref 12.0–15.0)
Immature Granulocytes: 0 %
Lymphocytes Relative: 10 %
Lymphs Abs: 1 10*3/uL (ref 0.7–4.0)
MCH: 27.2 pg (ref 26.0–34.0)
MCHC: 31.8 g/dL (ref 30.0–36.0)
MCV: 85.5 fL (ref 80.0–100.0)
Monocytes Absolute: 0.2 10*3/uL (ref 0.1–1.0)
Monocytes Relative: 2 %
Neutro Abs: 8.7 10*3/uL — ABNORMAL HIGH (ref 1.7–7.7)
Neutrophils Relative %: 88 %
Platelets: 240 10*3/uL (ref 150–400)
RBC: 4.49 MIL/uL (ref 3.87–5.11)
RDW: 14.5 % (ref 11.5–15.5)
WBC: 9.9 10*3/uL (ref 4.0–10.5)
nRBC: 0 % (ref 0.0–0.2)

## 2019-08-16 LAB — BLOOD GAS, VENOUS
Acid-base deficit: 2.2 mmol/L — ABNORMAL HIGH (ref 0.0–2.0)
Bicarbonate: 23.2 mmol/L (ref 20.0–28.0)
O2 Saturation: 63.8 %
Patient temperature: 98.6
pCO2, Ven: 44.6 mmHg (ref 44.0–60.0)
pH, Ven: 7.336 (ref 7.250–7.430)
pO2, Ven: 38.1 mmHg (ref 32.0–45.0)

## 2019-08-16 LAB — URINALYSIS, ROUTINE W REFLEX MICROSCOPIC
Bacteria, UA: NONE SEEN
Bilirubin Urine: NEGATIVE
Glucose, UA: >=500 mg/dL — AB
Hgb urine dipstick: NEGATIVE
Ketones, ur: 20 mg/dL — AB
Leukocytes,Ua: NEGATIVE
Nitrite: NEGATIVE
Protein, ur: NEGATIVE mg/dL
Specific Gravity, Urine: 1.012 (ref 1.005–1.030)
pH: 8 (ref 5.0–8.0)

## 2019-08-16 LAB — COMPREHENSIVE METABOLIC PANEL
ALT: 16 U/L (ref 0–44)
AST: 17 U/L (ref 15–41)
Albumin: 4.1 g/dL (ref 3.5–5.0)
Alkaline Phosphatase: 86 U/L (ref 38–126)
Anion gap: 11 (ref 5–15)
BUN: 18 mg/dL (ref 6–20)
CO2: 23 mmol/L (ref 22–32)
Calcium: 8.4 mg/dL — ABNORMAL LOW (ref 8.9–10.3)
Chloride: 101 mmol/L (ref 98–111)
Creatinine, Ser: 0.92 mg/dL (ref 0.44–1.00)
GFR calc Af Amer: >60 mL/min (ref >60)
GFR calc non Af Amer: >60 mL/min (ref >60)
Glucose, Bld: 280 mg/dL — ABNORMAL HIGH (ref 70–99)
Potassium: 3.8 mmol/L (ref 3.5–5.1)
Sodium: 135 mmol/L (ref 135–145)
Total Bilirubin: 0.7 mg/dL (ref 0.3–1.2)
Total Protein: 7.8 g/dL (ref 6.5–8.1)

## 2019-08-16 LAB — CBG MONITORING, ED
Glucose-Capillary: 270 mg/dL — ABNORMAL HIGH (ref 70–99)
Glucose-Capillary: 273 mg/dL — ABNORMAL HIGH (ref 70–99)

## 2019-08-16 LAB — TSH: TSH: 0.31 u[IU]/mL — ABNORMAL LOW (ref 0.350–4.500)

## 2019-08-16 LAB — BETA-HYDROXYBUTYRIC ACID: Beta-Hydroxybutyric Acid: 0.23 mmol/L (ref 0.05–0.27)

## 2019-08-16 LAB — MAGNESIUM: Magnesium: 1.7 mg/dL (ref 1.7–2.4)

## 2019-08-16 IMAGING — DX DG CHEST 1V PORT
1 series · 1 of 1 positions shown · non-contrast
Comparison: PA and lateral chest [DATE].

CLINICAL DATA: Weakness, nausea and vomiting.

EXAM:
PORTABLE CHEST 1 VIEW

[chest ap]
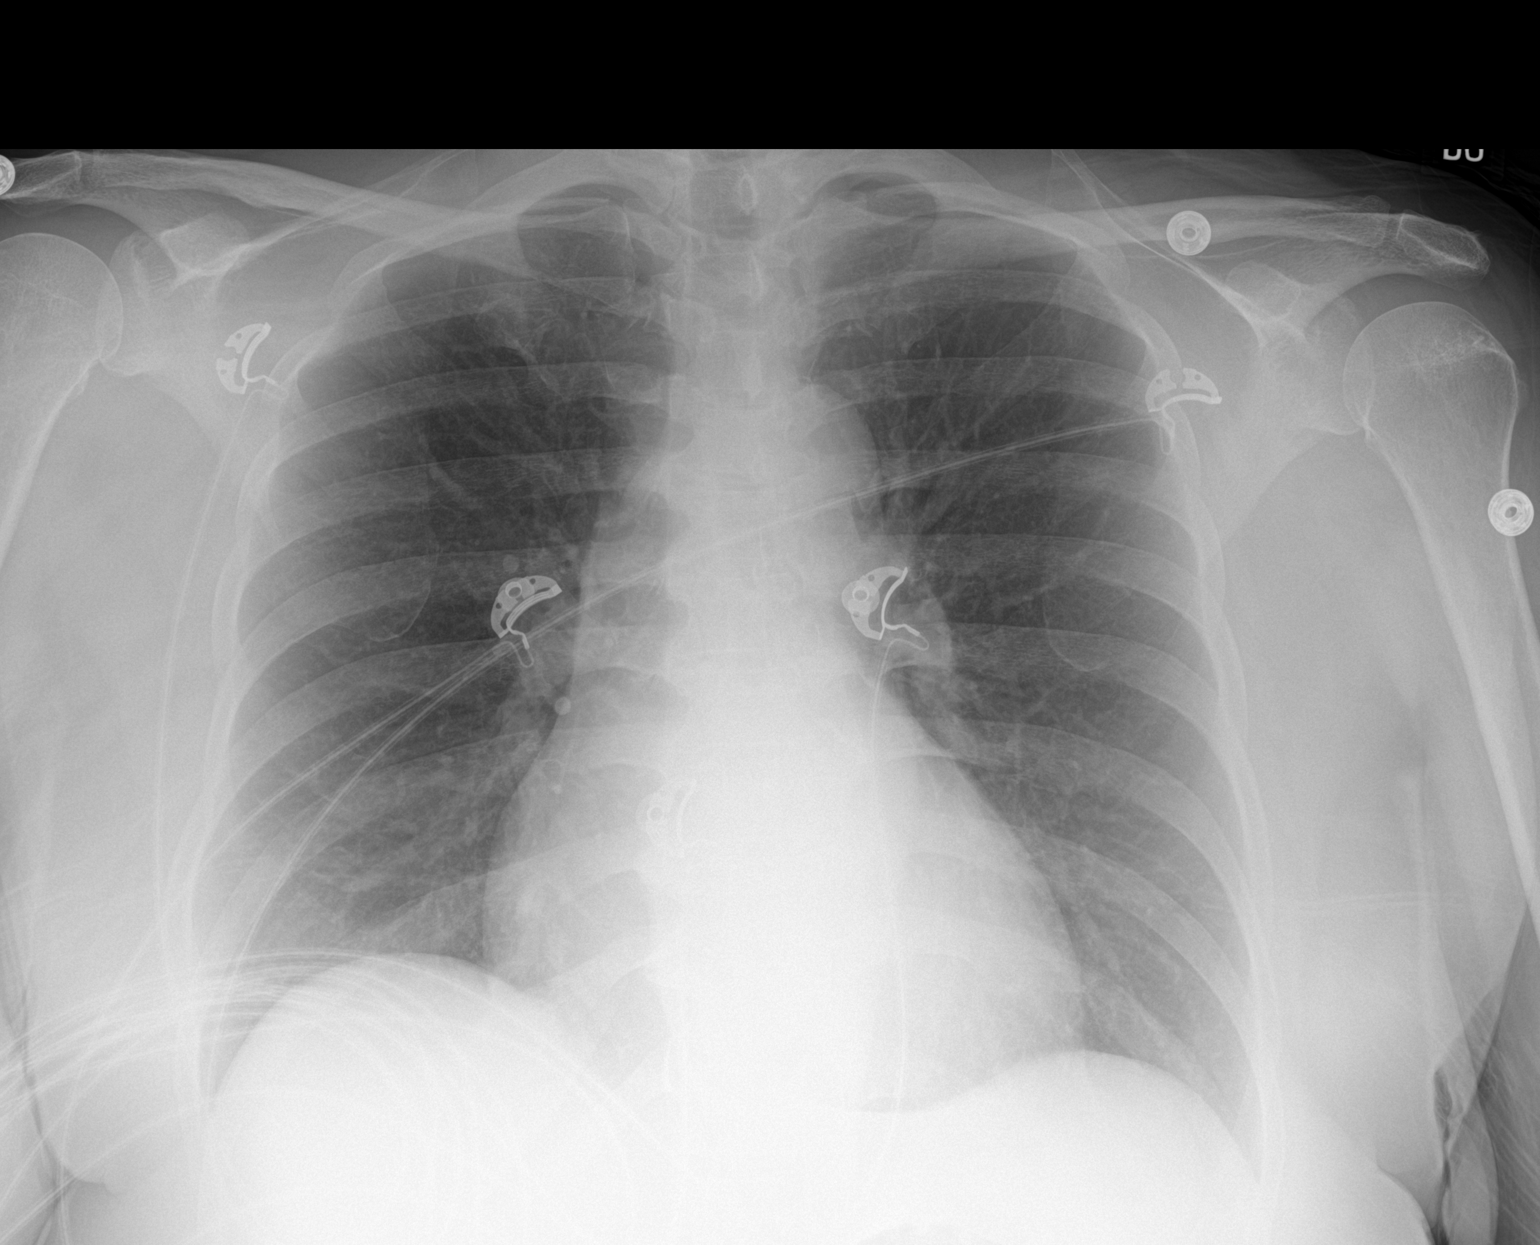

[1 of 1 positions shown; findings below may reference images not displayed]

FINDINGS: Lungs are clear. Heart size is normal. No pneumothorax or pleural
effusion. No acute or focal bony abnormality.
IMPRESSION: No acute disease.

## 2019-08-16 MED ORDER — HYDRALAZINE HCL 20 MG/ML IJ SOLN
10.0000 mg | Freq: Once | INTRAMUSCULAR | Status: AC
  Administered 2019-08-16: 10 mg via INTRAVENOUS
  Filled 2019-08-16: qty 1

## 2019-08-16 MED ORDER — SODIUM CHLORIDE 0.9 % IV BOLUS
1000.0000 mL | Freq: Once | INTRAVENOUS | Status: AC
  Administered 2019-08-16: 1000 mL via INTRAVENOUS

## 2019-08-16 NOTE — ED Notes (Signed)
Pt ambulatory to restroom prior to triaging, UA at bedside labelled with pt sticker.

## 2019-08-16 NOTE — ED Notes (Signed)
Pt ambulatory to restroom again, delaying lab collection. IV attempted x 2 prior, needing to use USIV guided imaging.

## 2019-08-16 NOTE — ED Notes (Signed)
Unsuccessful IV attempt by this RN 

## 2019-08-16 NOTE — ED Provider Notes (Signed)
Patient brought Hannah Dougherty   CSN: [id number] Arrival date & time: 08/16/19  1236     History Chief Complaint  Patient presents with  . Weakness  . Emesis    [name] Pough is a 49 y.o. female.  HPI Patient has a history of type 2 diabetes mellitus.  Per EMS, her son states she has a history of noncompliance.  Patient states that she typically takes insulin at night and ran out yesterday.  She did not have any insulin last night.  She woke this morning with diffuse weakness, nausea, vomiting, lightheadedness.  Per EMS, patient had peak T waves and also went to a junctional rhythm with a heart rate in the 40s.  No P waves.  Her fingerstick blood glucose was 283.  She has been hypertensive.  She has 218/91 right now.  Mild tachypnea.  Patient denies any abdominal pain.  No chest pain or shortness of breath.  Patient states she only takes her medications at night.  Per records, patient takes hydralazine 3 times a day and her sitagliptin/Metformin twice a day.  Based on what she is telling me, she does not take any medications during the day and has been noncompliant with both medications.  She denies fevers, chills, dysuria, syncope.  Per records, patient seen on April 5 for similar symptoms.  She was both hyperglycemic and hypertensive during the stay.  Long history of medication noncompliance.  Consult was placed for aid in providing a PCP.  Patient then followed up with her PCP on April 14.  She was placed on 10 mg amlodipine, 50 mg chlorthalidone, 25 mg hydralazine, sitagliptin/Metformin 50-1000 mg which she is supposed to take twice a day, 16 units of insulin glargine at night.     Past Medical History:  Diagnosis Date  . Diabetes mellitus without complication ([place])   . Hypertension     Patient Active Problem List   Diagnosis Date Noted  . Hypertension 12/28/2018  . Healthcare maintenance 12/14/2018  . Hyponatremia 12/10/2018  .  Diabetes ([place]) 12/09/2018    Past Surgical History:  Procedure Laterality Date  . TUBAL LIGATION    . VOCAL CORD LATERALIZATION, ENDOSCOPIC APPROACH W/ MLB       OB History   No obstetric history on file.     Family History  Problem Relation Age of Onset  . Hypertension Mother   . Diabetes Mother   . Kidney disease Mother   . Diabetes Father   . Hypertension Father   . Hypertension Maternal Grandmother   . Diabetes Maternal Grandmother   . Breast cancer Maternal Grandmother     Social History   Tobacco Use  . Smoking status: Current Every Day Smoker    Packs/day: 0.50    Types: Cigarettes  . Smokeless tobacco: Never Used  Substance Use Topics  . Alcohol use: Yes  . Drug use: Yes    Types: Marijuana    Home Medications Prior to Admission medications   Medication Sig Start Date End Date Taking? Authorizing Provider  amLODipine (NORVASC) 10 MG tablet Take 1 tablet (10 mg total) by mouth daily. 06/28/19   [name], NP  chlorthalidone (HYGROTON) 50 MG tablet Take 1 tablet (50 mg total) by mouth daily. 06/28/19   [name], NP  glucose blood (RELION CONFIRM/MICRO TEST) test strip Use as instructed 12/11/18   Asencion Noble, MD  hydrALAZINE (APRESOLINE) 50 MG tablet Take 0.5 tablets (25 mg total) by  mouth 3 (three) times daily. 06/28/19   [name], NP  insulin glargine (LANTUS SOLOSTAR) 100 UNIT/ML Solostar Pen Inject 16 Units into the skin at bedtime.    [provider]  Insulin Pen Needle (PEN NEEDLES) 31G X 5 MM MISC 1 Dose by Does not apply route 4 (four) times daily -  before meals and at bedtime. 12/20/18   Chundi, Verne Spurr, MD  lisinopril (ZESTRIL) 20 MG tablet Take 1 tablet (20 mg total) by mouth daily. 06/28/19   [name], NP  potassium chloride (KLOR-CON) 10 MEQ tablet Take 1 tablet (10 mEq total) by mouth daily. 06/19/19   Jacqlyn Larsen, PA-C  pravastatin (PRAVACHOL) 20 MG tablet Take 1 tablet (20 mg total) by mouth  daily. 08/09/19   [name], NP  ReliOn Lancet Devices 30G MISC 1 each by Does not apply route 3 (three) times daily. 12/11/18   Asencion Noble, MD  sitaGLIPtin-metformin (JANUMET) 50-1000 MG tablet Take 1 tablet by mouth 2 (two) times daily with a meal. 06/28/19   [name], NP    Allergies    Patient has no known allergies.  Review of Systems   Review of Systems  All other systems reviewed and are negative. Ten systems reviewed and are negative for acute change, except as noted in the HPI.    Physical Exam Updated Vital Signs BP (!) 223/99   Pulse 60   Temp 97.7 F (36.5 C) (Oral)   Resp 19   Ht 5\' 5"  (1.651 m)   Wt 88.9 kg   LMP 10/28/2016 (Within Weeks)   SpO2 100%   BMI 32.62 kg/m   Physical Exam Vitals and nursing Dougherty reviewed.  Constitutional:      General: She is in acute distress.     Appearance: Normal appearance. She is obese. She is ill-appearing. She is not toxic-appearing or diaphoretic.  HENT:     Head: Normocephalic and atraumatic.     Right Ear: External ear normal.     Left Ear: External ear normal.     Nose: Nose normal.     Mouth/Throat:     Mouth: Mucous membranes are moist.     Pharynx: Oropharynx is clear. No oropharyngeal exudate or posterior oropharyngeal erythema.  Eyes:     General: No scleral icterus.       Right eye: No discharge.        Left eye: No discharge.     Extraocular Movements: Extraocular movements intact.     Conjunctiva/sclera: Conjunctivae normal.     Pupils: Pupils are equal, round, and reactive to light.  Cardiovascular:     Rate and Rhythm: Regular rhythm. Bradycardia present.     Pulses: Normal pulses.     Heart sounds: Normal heart sounds. No murmur. No friction rub. No gallop.   Pulmonary:     Effort: Pulmonary effort is normal. No respiratory distress.     Breath sounds: Normal breath sounds. No stridor. No wheezing, rhonchi or rales.  Abdominal:     General: Abdomen is flat.      Palpations: Abdomen is soft.     Tenderness: There is abdominal tenderness.     Comments: Very mild diffuse abdominal tenderness noted.  Musculoskeletal:        General: Normal range of motion.     Cervical back: Normal range of motion and neck supple. No tenderness.  Skin:    General: Skin is warm and dry.  Neurological:  General: No focal deficit present.     Mental Status: She is alert and oriented to person, place, and time.     Comments: Patient is oriented to person, place, time.  Finger-to-nose intact bilaterally negative pronator drift.  Patient able to move all 4 extremities spontaneously without difficulty.  Psychiatric:        Mood and Affect: Mood normal.        Behavior: Behavior is slowed.    ED Results / Procedures / Treatments   Labs (all labs ordered are listed, but only abnormal results are displayed) Labs Reviewed  COMPREHENSIVE METABOLIC PANEL - Abnormal; Notable for the following components:      Result Value   Glucose, Bld 280 (*)    Calcium 8.4 (*)    All other components within normal limits  CBC WITH DIFFERENTIAL/PLATELET - Abnormal; Notable for the following components:   Neutro Abs 8.7 (*)    All other components within normal limits  URINALYSIS, ROUTINE W REFLEX MICROSCOPIC - Abnormal; Notable for the following components:   Color, Urine STRAW (*)    Glucose, UA >=500 (*)    Ketones, ur 20 (*)    All other components within normal limits  TSH - Abnormal; Notable for the following components:   TSH 0.310 (*)    All other components within normal limits  BLOOD GAS, VENOUS - Abnormal; Notable for the following components:   Acid-base deficit 2.2 (*)    All other components within normal limits  CBG MONITORING, ED - Abnormal; Notable for the following components:   Glucose-Capillary 270 (*)    All other components within normal limits  CBG MONITORING, ED - Abnormal; Notable for the following components:   Glucose-Capillary 273 (*)    All other  components within normal limits  BETA-HYDROXYBUTYRIC ACID  MAGNESIUM    EKG EKG Interpretation  Date/Time:  Wednesday August 16 2019 12:51:18 EDT Ventricular Rate:  62 PR Interval:    QRS Duration: 98 QT Interval:  465 QTC Calculation: 473 R Axis:   54 Text Interpretation: Sinus rhythm ST elev, probable normal early repol pattern When compared to priorl no significant cahnges seen. No STEMI Confirmed by Antony Blackbird [phone]) on 08/16/2019 1:32:02 PM   Radiology DG Chest Portable 1 View  Result Date: 08/16/2019 CLINICAL DATA:  Weakness, nausea and vomiting. EXAM: PORTABLE CHEST 1 VIEW COMPARISON:  PA and lateral chest 11/30/2014. FINDINGS: Lungs are clear. Heart size is normal. No pneumothorax or pleural effusion. No acute or focal bony abnormality. IMPRESSION: No acute disease. Electronically Signed   By: Inge Rise M.D.   On: 08/16/2019 15:01    Procedures Procedures (including critical care time)  Medications Ordered in ED Medications  sodium chloride 0.9 % bolus 1,000 mL (0 mLs Intravenous Stopped 08/16/19 1418)  hydrALAZINE (APRESOLINE) injection 10 mg (10 mg Intravenous Given 08/16/19 1642)   ED Course  I have reviewed the triage vital signs and the nursing notes.  Pertinent labs & imaging results that were available during my care of the patient were reviewed by me and considered in my medical decision making (see chart for details).  Clinical Course as of Aug 15 1712  Wed Aug 16, 2019  1329 Patient presents with nausea, vomiting, diffuse abdominal pain.  History of noncompliance.  Seen in April for similar symptoms.  States she "ran out of her insulin yesterday".  She has not taken insulin for 2 days.  Initial fingerstick blood glucose of 283.  She is mildly  bradycardic in the high 50s on in the room.  Will obtain basic labs, ECG, chest x-ray.  Will give a liter of fluids.  We will closely monitor.   [LJ]  [place] resulted.  Glucosuria.  Mild ketonuria 20.   Slightly decreased TSH at 0.310.  Mild neutrophilia at 8.7 without leukocytosis.  Though there was mild ketonuria her beta hydroxybutyrate was negative.  No anion gap.  Not concern for DKA at this time.  Patient has had a liter of fluids and states she "feels much better".  No more nausea.  Still has no other complaints at this time.  Will obtain repeat CBG.  Will repeat vitals.   [LJ]  1630 CBG unchanged at 273.  Patient is still hypertensive at 223/99.  Will give patient some hydralazine.  Will recheck.   [LJ]  1711 Blood pressure is significantly improved.  Patient states she feels much better.  She is going to follow-up with her primary care provider tomorrow morning as well as fill her insulin prescription.   [LJ]    Clinical Course User Index [LJ] Rayna Sexton, PA-C   MDM Rules/Calculators/A&P                      Patient is a 49 year old female with a history of medication noncompliance who presents for both hypertension as well as hyperglycemia.  She was initially experiencing significant nausea and vomiting.  Her physical exam is generally reassuring.  She was neurologically intact.  Patient was given a liter bolus of fluids.  She is now feeling much better.  She is still hypertensive.  She was given hydralazine which lowered her blood pressure to 197/83.  She was instructed to immediately restart her medications when she gets home.  She is not tachycardic.  Afebrile.  Saturating at 100%.  I urged patient to start taking her medications regularly and work on compliance.  I urged her to follow-up with her primary care provider tomorrow to discuss her hypertension as well as her medication regimen.  She ran out of of insulin glargine.  She states this was due to finances but is getting paid tomorrow and is planning on filling her prescription tomorrow.  Again, urged her to take her medications as prescribed.  She knows to follow-up with her PCP regarding her hyperglycemia.  Patient  was given very strict return precautions.  She understands to return to the emergency department with any new or worsening symptoms.  Her questions were answered and she was amicable at the time of discharge.  Her vital signs are stable.  Patient discharged to home/self care.  Condition at discharge: Stable  Dougherty: Portions of this report may have been transcribed using voice recognition software. Every effort was made to ensure accuracy; however, inadvertent computerized transcription errors may be present.    Final Clinical Impression(s) / ED Diagnoses Final diagnoses:  Non-intractable vomiting with nausea, unspecified vomiting type  Hyperglycemia  Hypertensive urgency   Rx / DC Orders ED Discharge Orders    None       Rayna Sexton, PA-C 08/16/19 1721    Tegeler, Gwenyth Allegra, MD 08/16/19 [phone]

## 2019-08-16 NOTE — ED Triage Notes (Signed)
Pt BIBA from home. Pt is a type 1 diabetic, and per son has a history of noncompliance. Pt states she has been having weakness, along with nausea and vomiting.  En route, pt had peaked T's, and also went into a junctional rhythym with HR in 40s , no P waves. . Pt states that she ran out of insulin yesterday.  190/110  283 FSBG

## 2019-08-16 NOTE — ED Notes (Addendum)
Unable to obtain labs. Ebony, phlebotomist, will attempt shortly.

## 2019-08-16 NOTE — Discharge Instructions (Addendum)
If your symptoms worsen I need to return to the emergency department for reevaluation.  Please follow-up with your primary care provider tomorrow morning to discuss this visit as well as your high blood sugar and high blood pressure.  It was a pleasure to meet you.

## 2019-08-17 ENCOUNTER — Telehealth (INDEPENDENT_AMBULATORY_CARE_PROVIDER_SITE_OTHER): Payer: Self-pay

## 2019-08-17 NOTE — Telephone Encounter (Signed)
Patient called to report that she was seen in the ED yesterday. She states her sugar was high and she was feeling nauseous. She is out of insulin. Needs refill sent to [facility]. Patient stated she is not taking Janumet; she researched and found that she should not be taking Janumet and insulin. Please advise and send refill if appropriate. Nat Christen, CMA

## 2019-08-18 ENCOUNTER — Telehealth (INDEPENDENT_AMBULATORY_CARE_PROVIDER_SITE_OTHER): Payer: Self-pay

## 2019-08-18 NOTE — Telephone Encounter (Signed)
Unable to send insulin unable to refill needs an appointment

## 2019-08-18 NOTE — Telephone Encounter (Signed)
Patient is aware that she will have to wait until appointment to discuss need for insulin.

## 2019-08-18 NOTE — Telephone Encounter (Signed)
Patient had daughter in lawEstill Bamberg) call to request medication for nausea. States patient has been vomiting for 3 days. After speaking with PCP; advised patient to go to urgent care or ED. Both patient and daughter in law Estill Bamberg verbalized understanding. Nat Christen, CMA

## 2019-08-30 MED FILL — AMLODIPINE BESYLATE 10 MG T: 10 | 30 days supply | Qty: 30 | Fill #1

## 2019-08-30 MED FILL — LISINOPRIL 20 MG TABLET: 20 | 30 days supply | Qty: 30 | Fill #1

## 2019-10-09 ENCOUNTER — Other Ambulatory Visit: Payer: Self-pay

## 2019-10-09 ENCOUNTER — Other Ambulatory Visit (INDEPENDENT_AMBULATORY_CARE_PROVIDER_SITE_OTHER): Payer: Self-pay | Admitting: Primary Care

## 2019-10-09 ENCOUNTER — Encounter (INDEPENDENT_AMBULATORY_CARE_PROVIDER_SITE_OTHER): Payer: Self-pay | Admitting: Primary Care

## 2019-10-09 ENCOUNTER — Ambulatory Visit (INDEPENDENT_AMBULATORY_CARE_PROVIDER_SITE_OTHER): Payer: Self-pay | Admitting: Primary Care

## 2019-10-09 VITALS — BP 156/95 | Temp 98.6°F | Resp 16 | Ht 65.0 in | Wt 186.0 lb

## 2019-10-09 DIAGNOSIS — E782 Mixed hyperlipidemia: Secondary | ICD-10-CM

## 2019-10-09 DIAGNOSIS — Z9189 Other specified personal risk factors, not elsewhere classified: Secondary | ICD-10-CM

## 2019-10-09 DIAGNOSIS — E119 Type 2 diabetes mellitus without complications: Secondary | ICD-10-CM

## 2019-10-09 DIAGNOSIS — Z23 Encounter for immunization: Secondary | ICD-10-CM

## 2019-10-09 DIAGNOSIS — I10 Essential (primary) hypertension: Secondary | ICD-10-CM

## 2019-10-09 DIAGNOSIS — Z1231 Encounter for screening mammogram for malignant neoplasm of breast: Secondary | ICD-10-CM

## 2019-10-09 DIAGNOSIS — Z794 Long term (current) use of insulin: Secondary | ICD-10-CM

## 2019-10-09 DIAGNOSIS — Z760 Encounter for issue of repeat prescription: Secondary | ICD-10-CM

## 2019-10-09 LAB — POCT GLYCOSYLATED HEMOGLOBIN (HGB A1C): Hemoglobin A1C: 8.5 % — AB (ref 4.0–5.6)

## 2019-10-09 LAB — GLUCOSE, POCT (MANUAL RESULT ENTRY): POC Glucose: 204 mg/dl — AB (ref 70–99)

## 2019-10-09 MED ORDER — HYDRALAZINE HCL 50 MG PO TABS
50.0000 mg | ORAL_TABLET | Freq: Three times a day (TID) | ORAL | 1 refills | Status: DC
Start: 2019-10-09 — End: 2020-01-15

## 2019-10-09 MED ORDER — TRULICITY 0.75 MG/0.5ML ~~LOC~~ SOAJ
0.7500 mg | SUBCUTANEOUS | 3 refills | Status: DC
Start: 2019-10-09 — End: 2020-01-12

## 2019-10-09 MED ORDER — PRAVASTATIN SODIUM 20 MG PO TABS: 20.0000 mg | ORAL_TABLET | Freq: Every day | ORAL | 1 refills | Status: DC

## 2019-10-09 MED ORDER — AMLODIPINE BESYLATE 10 MG PO TABS: 10.0000 mg | ORAL_TABLET | Freq: Every day | ORAL | 3 refills | Status: DC

## 2019-10-09 MED ORDER — LISINOPRIL 20 MG PO TABS: 20.0000 mg | ORAL_TABLET | Freq: Every day | ORAL | 3 refills | Status: DC

## 2019-10-09 MED FILL — !TRULICITY 0.75 MG/0.5 ML P: 0.75 | 28 days supply | Qty: 2 | Fill #0

## 2019-10-09 MED FILL — hydrALAZINE HCL 50 MG TABS: 50 | 30 days supply | Qty: 90 | Fill #0

## 2019-10-09 MED FILL — PRAVASTATIN SODIUM 20 MG TA: 20 | 30 days supply | Qty: 30 | Fill #0

## 2019-10-09 MED FILL — AMLODIPINE BESYLATE 10 MG T: 10 | 30 days supply | Qty: 30 | Fill #0

## 2019-10-09 MED FILL — LISINOPRIL 20 MG TABLET: 20 | 30 days supply | Qty: 30 | Fill #0

## 2019-10-09 NOTE — Progress Notes (Signed)
Here for BP check and DM  F /u

## 2019-10-09 NOTE — Progress Notes (Signed)
Established Patient Office Visit  Subjective:  Patient ID: Hannah Dougherty, female    DOB: [birth year]  Age: 49 y.o. MRN: [id number]  CC: No chief complaint on file.   HPI Hannah Dougherty for management of type 2 diabetes and hypertension.  She denies any signs and symptoms of hyperglycemia. She denies shortness of breath, headaches, chest pain or lower extremity edema presents for   Past Medical History:  Diagnosis Date   Diabetes mellitus without complication ([place])    Hypertension     Past Surgical History:  Procedure Laterality Date   TUBAL LIGATION     VOCAL CORD LATERALIZATION, ENDOSCOPIC APPROACH W/ MLB      Family History  Problem Relation Age of Onset   Hypertension Mother    Diabetes Mother    Kidney disease Mother    Diabetes Father    Hypertension Father    Hypertension Maternal Grandmother    Diabetes Maternal Grandmother    Breast cancer Maternal Grandmother     Social History   Socioeconomic History   Marital status: Single    Spouse name: Not on file   Number of children: Not on file   Years of education: Not on file   Highest education level: Not on file  Occupational History   Not on file  Tobacco Use   Smoking status: Current Every Day Smoker    Packs/day: 0.50    Types: Cigarettes   Smokeless tobacco: Never Used  Substance and Sexual Activity   Alcohol use: Yes   Drug use: Yes    Types: Marijuana   Sexual activity: Not on file  Other Topics Concern   Not on file  Social History Narrative   Not on file   Social Determinants of Health   Financial Resource Strain:    Difficulty of Paying Living Expenses:   Food Insecurity:    Worried About Charity fundraiser in the Last Year:    Arboriculturist in the Last Year:   Transportation Needs:    Film/video editor (Medical):    Lack of Transportation (Non-Medical):   Physical Activity:    Days of Exercise per Week:    Minutes of Exercise per Session:    Stress:    Feeling of Stress :   Social Connections:    Frequency of Communication with Friends and Family:    Frequency of Social Gatherings with Friends and Family:    Attends Religious Services:    Active Member of Clubs or Organizations:    Attends Music therapist:    Marital Status:   Intimate Partner Violence:    Fear of Current or Ex-Partner:    Emotionally Abused:    Physically Abused:    Sexually Abused:     Outpatient Medications Prior to Visit  Medication Sig Dispense Refill   glucose blood (RELION CONFIRM/MICRO TEST) test strip Use as instructed 100 each 0   Insulin Pen Needle (PEN NEEDLES) 31G X 5 MM MISC 1 Dose by Does not apply route 4 (four) times daily -  before meals and at bedtime. 100 each 1   potassium chloride (KLOR-CON) 10 MEQ tablet Take 1 tablet (10 mEq total) by mouth daily. 30 tablet 0   ReliOn Lancet Devices 30G MISC 1 each by Does not apply route 3 (three) times daily. 100 each 0   amLODipine (NORVASC) 10 MG tablet Take 1 tablet (10 mg total) by mouth daily. 90 tablet 3   chlorthalidone (HYGROTON)  50 MG tablet Take 1 tablet (50 mg total) by mouth daily. 30 tablet 1   hydrALAZINE (APRESOLINE) 50 MG tablet Take 0.5 tablets (25 mg total) by mouth 3 (three) times daily. 90 tablet 1   insulin glargine (LANTUS SOLOSTAR) 100 UNIT/ML Solostar Pen Inject 16 Units into the skin at bedtime.     lisinopril (ZESTRIL) 20 MG tablet Take 1 tablet (20 mg total) by mouth daily. 90 tablet 3   pravastatin (PRAVACHOL) 20 MG tablet Take 1 tablet (20 mg total) by mouth daily. 90 tablet 1   sitaGLIPtin-metformin (JANUMET) 50-1000 MG tablet Take 1 tablet by mouth 2 (two) times daily with a meal. (Patient not taking: Reported on 08/16/2019) 60 tablet 1   No facility-administered medications prior to visit.    No Known Allergies  ROS Review of Systems    Objective:    Physical Exam Vitals reviewed.  HENT:     Head: Normocephalic.      Nose: Nose normal.  Eyes:     Extraocular Movements: Extraocular movements intact.     Pupils: Pupils are equal, round, and reactive to light.  Cardiovascular:     Rate and Rhythm: Normal rate and regular rhythm.     Heart sounds: Murmur heard.   Pulmonary:     Effort: Pulmonary effort is normal.     Breath sounds: Normal breath sounds.  Abdominal:     General: Bowel sounds are normal.     Palpations: Abdomen is soft.  Musculoskeletal:        General: Normal range of motion.     Cervical back: Normal range of motion and neck supple.  Skin:    General: Skin is warm and dry.  Neurological:     Mental Status: She is alert and oriented to person, place, and time.  Psychiatric:        Mood and Affect: Mood normal.        Behavior: Behavior normal.        Thought Content: Thought content normal.        Judgment: Judgment normal.     BP (!) 156/95    Temp 98.6 F (37 C)    Resp 16    Ht 5\' 5"  (1.651 m)    Wt 186 lb (84.4 kg)    LMP 10/28/2016 (Within Weeks)    SpO2 99%    BMI 30.95 kg/m  Wt Readings from Last 3 Encounters:  10/09/19 186 lb (84.4 kg)  08/16/19 196 lb (88.9 kg)  08/09/19 196 lb 12.8 oz (89.3 kg)     Health Maintenance Due  Topic Date Due   Hepatitis C Screening  Never done   PNEUMOCOCCAL POLYSACCHARIDE VACCINE AGE 5-64 HIGH RISK  Never done   OPHTHALMOLOGY EXAM  Never done   COVID-19 Vaccine (1) Never done   PAP SMEAR-Modifier  Never done   MAMMOGRAM  Never done   COLONOSCOPY  Never done    There are no preventive care reminders to display for this patient.  Lab Results  Component Value Date   TSH 0.310 (L) 08/16/2019   Lab Results  Component Value Date   WBC 9.9 08/16/2019   HGB 12.2 08/16/2019   HCT 38.4 08/16/2019   MCV 85.5 08/16/2019   PLT 240 08/16/2019   Lab Results  Component Value Date   NA 135 08/16/2019   K 3.8 08/16/2019   CO2 23 08/16/2019   GLUCOSE 280 (H) 08/16/2019   BUN 18 08/16/2019   CREATININE 0.92  08/16/2019    BILITOT 0.7 08/16/2019   ALKPHOS 86 08/16/2019   AST 17 08/16/2019   ALT 16 08/16/2019   PROT 7.8 08/16/2019   ALBUMIN 4.1 08/16/2019   CALCIUM 8.4 (L) 08/16/2019   ANIONGAP 11 08/16/2019   Lab Results  Component Value Date   CHOL 197 06/28/2019   Lab Results  Component Value Date   HDL 49 06/28/2019   Lab Results  Component Value Date   LDLCALC 130 (H) 06/28/2019   Lab Results  Component Value Date   TRIG 102 06/28/2019   Lab Results  Component Value Date   CHOLHDL 4.0 06/28/2019   Lab Results  Component Value Date   HGBA1C 8.5 (A) 10/09/2019      Assessment & Plan:  Diagnoses and all orders for this visit:  Type 2 diabetes mellitus without complication, with long-term current use of insulin ([place]) ADA recommends the following therapeutic goals for glycemic control related to A1c measurements: Decrease foods that are high in carbohydrates are the following rice, potatoes, breads, sugars, and pastas.  Reduction in the intake (eating) will assist in lowering your blood sugars.inprovement from 9.6 3 months ago but not a goal. -     HgB A1c 8.5  -     POCT glucose (manual entry)  Mixed hyperlipidemia  Decrease your fatty foods, red meat, cheese, milk and increase fiber like whole grains and veggies.  -     pravastatin (PRAVACHOL) 20 MG tablet; Take 1 tablet (20 mg total) by mouth daily.  Essential hypertension BP goal - < 130/80 Explained that having normal blood pressure is the goal and medications are helping to get to goal and maintain normal blood pressure. DIET: Limit salt intake, read nutrition labels to check salt content, limit fried and high fatty foods  Avoid using multisymptom OTC cold preparations that generally contain sudafed which can rise BP. Consult with pharmacist on best cold relief products to use for persons with HTN EXERCISE Discussed incorporating exercise such as walking - 30 minutes most days of the week and can do in 10 minute intervals   -      amLODipine (NORVASC) 10 MG tablet; Take 1 tablet (10 mg total) by mouth daily. -     lisinopril (ZESTRIL) 20 MG tablet; Take 1 tablet (20 mg total) by mouth daily.  Encounter for screening mammogram for malignant neoplasm of breast Patient completed application for [facility] while in clinic and application has and faxed to [facility]. Patient aware that [facility] will contact her directly to schedule appointment.  Pneumococcal vaccination indicated CDC recommendations for pneumococcal vaccination to help prevent the spread of pneumococcal disease. -     Pneumococcal conjugate vaccine 13-valent  Medication refill amLODipine (NORVASC) 10 MG tablet; Take 1 tablet (10 mg total) by mouth daily. -     lisinopril (ZESTRIL) 20 MG tablet; Take 1 tablet (20 mg total) by mouth daily.  Other orders -     Dulaglutide (TRULICITY) 4.76 LY/6.5KP SOPN; Inject 0.5 mLs (0.75 mg total) into the skin once a week. -     hydrALAZINE (APRESOLINE) 50 MG tablet; Take 1 tablet (50 mg total) by mouth 3 (three) times daily.    Meds ordered this encounter  Medications   Dulaglutide (TRULICITY) 5.46 FK/8.1EX SOPN    Sig: Inject 0.5 mLs (0.75 mg total) into the skin once a week.    Dispense:  5 pen    Refill:  3   hydrALAZINE (APRESOLINE) 50 MG tablet  Sig: Take 1 tablet (50 mg total) by mouth 3 (three) times daily.    Dispense:  90 tablet    Refill:  1   pravastatin (PRAVACHOL) 20 MG tablet    Sig: Take 1 tablet (20 mg total) by mouth daily.    Dispense:  90 tablet    Refill:  1   amLODipine (NORVASC) 10 MG tablet    Sig: Take 1 tablet (10 mg total) by mouth daily.    Dispense:  90 tablet    Refill:  3   lisinopril (ZESTRIL) 20 MG tablet    Sig: Take 1 tablet (20 mg total) by mouth daily.    Dispense:  90 tablet    Refill:  3    Follow-up: Return in about 3 months (around 01/09/2020) for pap/  BP/DM in person.    [name], NP

## 2019-10-09 NOTE — Patient Instructions (Signed)

## 2019-10-10 ENCOUNTER — Other Ambulatory Visit (INDEPENDENT_AMBULATORY_CARE_PROVIDER_SITE_OTHER): Payer: Self-pay | Admitting: Primary Care

## 2019-10-10 DIAGNOSIS — E119 Type 2 diabetes mellitus without complications: Secondary | ICD-10-CM

## 2019-10-11 LAB — MICROALBUMIN, URINE: Microalbumin, Urine: 12.2 ug/mL

## 2019-10-12 ENCOUNTER — Telehealth (INDEPENDENT_AMBULATORY_CARE_PROVIDER_SITE_OTHER): Payer: Self-pay

## 2019-10-12 NOTE — Telephone Encounter (Signed)
-----   Message from [name], NP sent at 10/11/2019  3:18 PM EDT ----- Microalbumin is normal

## 2019-10-12 NOTE — Telephone Encounter (Signed)
Patient aware of normal microalbumin. Alexandera Kuntzman S Lenka Zhao, CMA  

## 2019-10-18 MED FILL — LISINOPRIL 20 MG TABLET: 20 | 30 days supply | Qty: 30 | Fill #0

## 2019-10-18 MED FILL — AMLODIPINE BESYLATE 10 MG T: 10 | 30 days supply | Qty: 30 | Fill #0

## 2019-10-18 MED FILL — !TRULICITY 0.75 MG/0.5 ML P: 0.75 | 27 days supply | Qty: 2 | Fill #0

## 2019-10-18 MED FILL — hydrALAZINE HCL 50 MG TABS: 50 | 30 days supply | Qty: 90 | Fill #0

## 2019-10-18 MED FILL — PRAVASTATIN SODIUM 20 MG TA: 20 | 30 days supply | Qty: 30 | Fill #0

## 2019-11-15 MED FILL — TRULICITY 0.75 MG/0.5 ML PE: 0.75 | 28 days supply | Qty: 2 | Fill #1

## 2019-12-07 MED FILL — !TRULICITY 0.75 MG/0.5 ML P: 0.75 | 27 days supply | Qty: 2 | Fill #1

## 2020-01-02 ENCOUNTER — Emergency Department (HOSPITAL_COMMUNITY): Payer: Self-pay

## 2020-01-02 ENCOUNTER — Other Ambulatory Visit: Payer: Self-pay

## 2020-01-02 ENCOUNTER — Encounter (HOSPITAL_COMMUNITY): Payer: Self-pay | Admitting: *Deleted

## 2020-01-02 DIAGNOSIS — Z5321 Procedure and treatment not carried out due to patient leaving prior to being seen by health care provider: Secondary | ICD-10-CM | POA: Insufficient documentation

## 2020-01-02 DIAGNOSIS — Y92009 Unspecified place in unspecified non-institutional (private) residence as the place of occurrence of the external cause: Secondary | ICD-10-CM | POA: Insufficient documentation

## 2020-01-02 DIAGNOSIS — S90122A Contusion of left lesser toe(s) without damage to nail, initial encounter: Secondary | ICD-10-CM | POA: Insufficient documentation

## 2020-01-02 DIAGNOSIS — W010XXA Fall on same level from slipping, tripping and stumbling without subsequent striking against object, initial encounter: Secondary | ICD-10-CM | POA: Insufficient documentation

## 2020-01-02 IMAGING — DX DG TOE 5TH 2+V*L*
3 series · 3 of 3 positions shown · non-contrast
Comparison: None.

CLINICAL DATA: Recent trip and fall with lateral foot pain, initial
encounter

EXAM:
DG TOE 5TH LEFT

[toe ap]
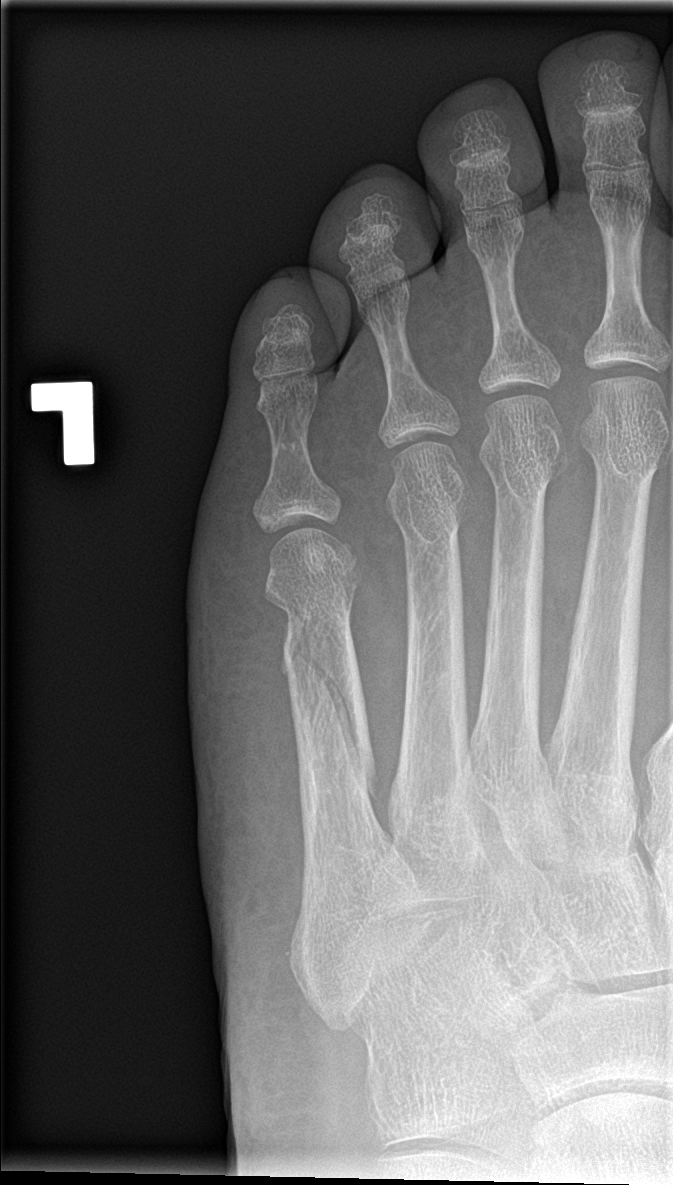

[toe obl]
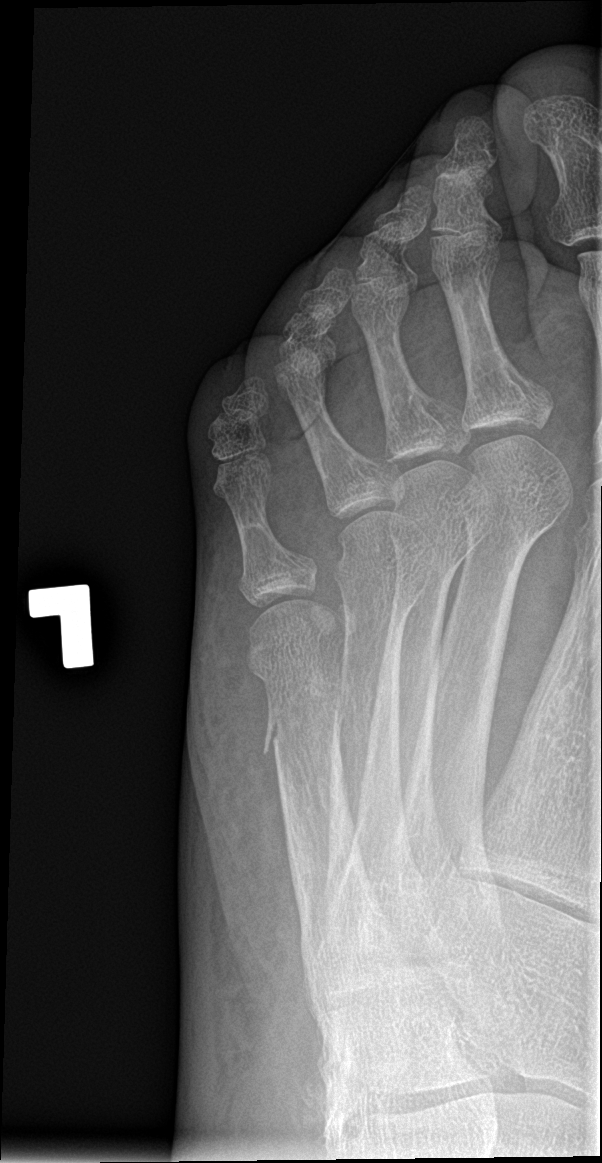

[toe lat]
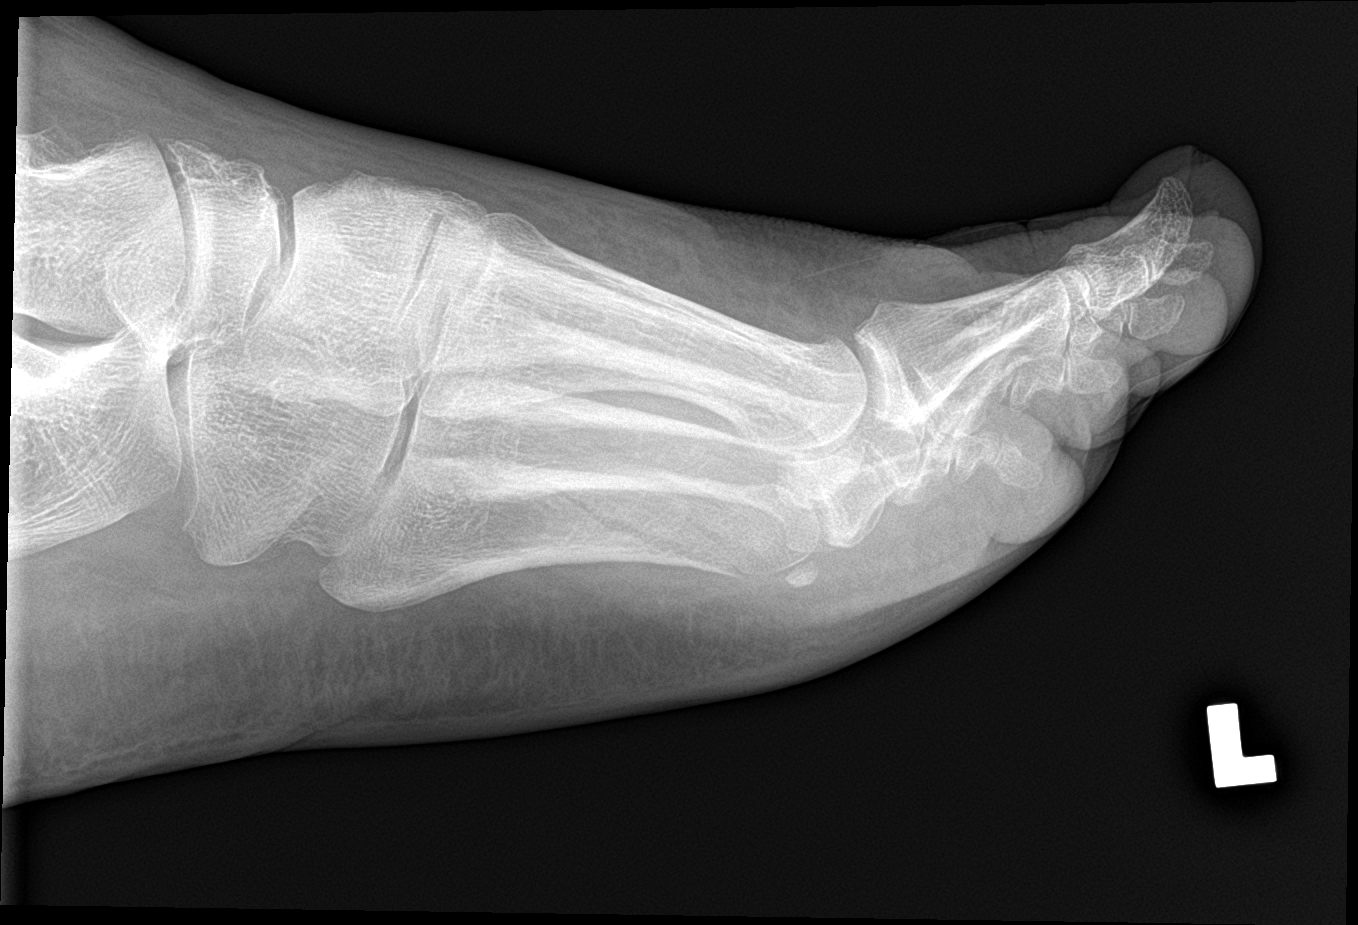

[3 of 3 positions shown; findings below may reference images not displayed]

FINDINGS: There is an oblique fracture through the midshaft of the fifth
metatarsal with mild displacement identified. No other focal
abnormality is seen.
IMPRESSION: Oblique fracture through the midshaft of the fifth metatarsal.

## 2020-01-02 NOTE — ED Triage Notes (Signed)
Pt tripped over something on the porch and stepped off, pt was able to cath self and prevent from falling but c/o left 5th toe is bruised and painful to walk on.

## 2020-01-03 ENCOUNTER — Encounter (HOSPITAL_COMMUNITY): Payer: Self-pay

## 2020-01-03 ENCOUNTER — Other Ambulatory Visit: Payer: Self-pay

## 2020-01-03 ENCOUNTER — Emergency Department (HOSPITAL_COMMUNITY)
Admission: EM | Admit: 2020-01-03 | Discharge: 2020-01-03 | Disposition: A | Discharge status: Home / Self Care | Payer: Self-pay | Attending: Emergency Medicine | Admitting: Emergency Medicine

## 2020-01-03 DIAGNOSIS — W108XXA Fall (on) (from) other stairs and steps, initial encounter: Secondary | ICD-10-CM | POA: Insufficient documentation

## 2020-01-03 DIAGNOSIS — F17210 Nicotine dependence, cigarettes, uncomplicated: Secondary | ICD-10-CM | POA: Insufficient documentation

## 2020-01-03 DIAGNOSIS — Z79899 Other long term (current) drug therapy: Secondary | ICD-10-CM | POA: Insufficient documentation

## 2020-01-03 DIAGNOSIS — E119 Type 2 diabetes mellitus without complications: Secondary | ICD-10-CM | POA: Insufficient documentation

## 2020-01-03 DIAGNOSIS — Z794 Long term (current) use of insulin: Secondary | ICD-10-CM | POA: Insufficient documentation

## 2020-01-03 DIAGNOSIS — S92355A Nondisplaced fracture of fifth metatarsal bone, left foot, initial encounter for closed fracture: Secondary | ICD-10-CM | POA: Insufficient documentation

## 2020-01-03 DIAGNOSIS — Y92009 Unspecified place in unspecified non-institutional (private) residence as the place of occurrence of the external cause: Secondary | ICD-10-CM | POA: Insufficient documentation

## 2020-01-03 DIAGNOSIS — I10 Essential (primary) hypertension: Secondary | ICD-10-CM | POA: Insufficient documentation

## 2020-01-03 MED ORDER — LISINOPRIL 10 MG PO TABS
20.0000 mg | ORAL_TABLET | Freq: Once | ORAL | Status: AC
  Administered 2020-01-03: 20 mg via ORAL
  Filled 2020-01-03: qty 2

## 2020-01-03 MED ORDER — HYDRALAZINE HCL 20 MG/ML IJ SOLN
10.0000 mg | Freq: Once | INTRAMUSCULAR | Status: AC
  Administered 2020-01-03: 10 mg via INTRAVENOUS
  Filled 2020-01-03: qty 1

## 2020-01-03 MED ORDER — ONDANSETRON 4 MG PO TBDP
4.0000 mg | ORAL_TABLET | Freq: Three times a day (TID) | ORAL | 0 refills | Status: DC | PRN
Start: 2020-01-03 — End: 2020-01-15

## 2020-01-03 MED ORDER — AMLODIPINE BESYLATE 5 MG PO TABS
10.0000 mg | ORAL_TABLET | Freq: Once | ORAL | Status: AC
  Administered 2020-01-03: 10 mg via ORAL
  Filled 2020-01-03: qty 2

## 2020-01-03 MED ORDER — OXYCODONE-ACETAMINOPHEN 5-325 MG PO TABS
1.0000 | ORAL_TABLET | Freq: Once | ORAL | Status: AC
  Administered 2020-01-03: 1 via ORAL
  Filled 2020-01-03: qty 1

## 2020-01-03 MED ORDER — OXYCODONE-ACETAMINOPHEN 5-325 MG PO TABS: 1.0000 | ORAL_TABLET | Freq: Four times a day (QID) | ORAL | 0 refills | Status: DC | PRN

## 2020-01-03 MED ORDER — HYDRALAZINE HCL 25 MG PO TABS
50.0000 mg | ORAL_TABLET | Freq: Once | ORAL | Status: AC
  Administered 2020-01-03: 50 mg via ORAL
  Filled 2020-01-03: qty 2

## 2020-01-03 MED ORDER — ONDANSETRON 4 MG PO TBDP
4.0000 mg | ORAL_TABLET | Freq: Once | ORAL | Status: AC
  Administered 2020-01-03: 4 mg via ORAL
  Filled 2020-01-03: qty 1

## 2020-01-03 NOTE — Discharge Instructions (Addendum)
You were found to have a fracture of your foot today. Please keep splint on and use crutches to prevent baring weight onto the foot. DO NOT GET SPLINT WET.   Follow up with Dr. Aline Brochure orthopedist for further evaluation. Call to schedule an appointment for later this week/early next week.   While at home please elevate your foot to reduce pain/swelling. I have also prescribed a short course of narcotic pain meds. Take as needed for SEVERE pain. I have also prescribed nausea medication to take prior to pain meds if needed however would recommend taking the meds with food.   Follow up with your PCP regarding your blood pressure - take your blood pressure medication as prescribed.

## 2020-01-03 NOTE — ED Provider Notes (Signed)
[facility] EMERGENCY DEPARTMENT Provider Note   CSN: [id number] Arrival date & time: 01/03/20  1025     History Chief Complaint  Patient presents with  . Foot Pain    Hannah Dougherty is a 49 y.o. female with PMHx HTN and DM who presents to the ED today with sudden onset, constant, worsening, left foot pain s/p injury that occurred 4 days ago. Pt reports that she was stepping out of her house onto the porch when she stepped on something on the porch causing her to invert her foot and fall off the porch which is approximately 2 steps off the ground. Pt reports that since then she has been having worsening pain. She attempted to go to [facility] Monday for evaluation however LWBS. She then came to [place] last night however did the same thing. She did have an xray while in the waiting room yesterday with findings: IMPRESSION: Oblique fracture through the midshaft of the fifth metatarsal. Pt has been baring weight onto her foot however reports worsening pain with it. She has been taking Ibuprofen and Tylenol with minimal relief. No other complaints at this time.   The history is provided by the patient and medical records.       Past Medical History:  Diagnosis Date  . Diabetes mellitus without complication ([place])   . Hypertension     Patient Active Problem List   Diagnosis Date Noted  . Hypertension 12/28/2018  . Healthcare maintenance 12/14/2018  . Hyponatremia 12/10/2018  . Diabetes ([place]) 12/09/2018    Past Surgical History:  Procedure Laterality Date  . TUBAL LIGATION    . VOCAL CORD LATERALIZATION, ENDOSCOPIC APPROACH W/ MLB       OB History   No obstetric history on file.     Family History  Problem Relation Age of Onset  . Hypertension Mother   . Diabetes Mother   . Kidney disease Mother   . Diabetes Father   . Hypertension Father   . Hypertension Maternal Grandmother   . Diabetes Maternal Grandmother   . Breast cancer Maternal Grandmother     Social History    Tobacco Use  . Smoking status: Current Some Day Smoker    Packs/day: 0.50    Types: Cigarettes  . Smokeless tobacco: Never Used  Substance Use Topics  . Alcohol use: Yes  . Drug use: Yes    Types: Marijuana    Home Medications Prior to Admission medications   Medication Sig Start Date End Date Taking? Authorizing Provider  amLODipine (NORVASC) 10 MG tablet Take 1 tablet (10 mg total) by mouth daily. 10/09/19   [name], NP  Dulaglutide (TRULICITY) 4.81 EH/6.3JS SOPN Inject 0.5 mLs (0.75 mg total) into the skin once a week. 10/09/19   [name], NP  glucose blood (RELION CONFIRM/MICRO TEST) test strip Use as instructed 12/11/18   Asencion Noble, MD  hydrALAZINE (APRESOLINE) 50 MG tablet Take 1 tablet (50 mg total) by mouth 3 (three) times daily. 10/09/19   [name], NP  Insulin Pen Needle (PEN NEEDLES) 31G X 5 MM MISC 1 Dose by Does not apply route 4 (four) times daily -  before meals and at bedtime. 12/20/18   Chundi, Verne Spurr, MD  lisinopril (ZESTRIL) 20 MG tablet Take 1 tablet (20 mg total) by mouth daily. 10/09/19   [name], NP  ondansetron (ZOFRAN ODT) 4 MG disintegrating tablet Take 1 tablet (4 mg total) by mouth every 8 (eight) hours as needed  for nausea or vomiting. 01/03/20   Eustaquio Maize, PA-C  oxyCODONE-acetaminophen (PERCOCET/ROXICET) 5-325 MG tablet Take 1 tablet by mouth every 6 (six) hours as needed for severe pain. 01/03/20   Alroy Bailiff, Kvion Shapley, PA-C  potassium chloride (KLOR-CON) 10 MEQ tablet Take 1 tablet (10 mEq total) by mouth daily. 06/19/19   Jacqlyn Larsen, PA-C  pravastatin (PRAVACHOL) 20 MG tablet Take 1 tablet (20 mg total) by mouth daily. 10/09/19   [name], NP  ReliOn Lancet Devices 30G MISC 1 each by Does not apply route 3 (three) times daily. 12/11/18   Asencion Noble, MD    Allergies    Patient has no known allergies.  Review of Systems   Review of Systems  Constitutional: Negative for chills and  fever.  Musculoskeletal: Positive for arthralgias and joint swelling.  Skin: Positive for color change (bruising). Negative for wound.    Physical Exam Updated Vital Signs BP (!) 169/132 (BP Location: Right Arm)   Pulse 64   Temp 98 F (36.7 C) (Oral)   Resp 20   Ht 5\' 5"  (1.651 m)   Wt 89.8 kg   LMP 10/28/2016 (Within Weeks)   SpO2 100%   BMI 32.95 kg/m   Physical Exam Vitals and nursing note reviewed.  Constitutional:      Appearance: She is not ill-appearing.  HENT:     Head: Normocephalic and atraumatic.  Eyes:     Conjunctiva/sclera: Conjunctivae normal.  Cardiovascular:     Rate and Rhythm: Normal rate and regular rhythm.  Pulmonary:     Effort: Pulmonary effort is normal.     Breath sounds: Normal breath sounds. No wheezing, rhonchi or rales.  Musculoskeletal:     Comments: Ecchymosis and swelling noted to lateral aspect of left mid foot with TTP. No tenderness to medial aspect or all toes. ROM intact to ankle joint and toes. Cap refill < 2 seconds. 2+ DP pulse.   Skin:    General: Skin is warm and dry.     Coloration: Skin is not jaundiced.  Neurological:     Mental Status: She is alert.     ED Results / Procedures / Treatments   Labs (all labs ordered are listed, but only abnormal results are displayed) Labs Reviewed - No data to display  EKG None  Radiology DG Toe 5th Left  Result Date: 01/02/2020 CLINICAL DATA:  Recent trip and fall with lateral foot pain, initial encounter EXAM: DG TOE 5TH LEFT COMPARISON:  None. FINDINGS: There is an oblique fracture through the midshaft of the fifth metatarsal with mild displacement identified. No other focal abnormality is seen. IMPRESSION: Oblique fracture through the midshaft of the fifth metatarsal. Electronically Signed   By: Inez Catalina M.D.   On: 01/02/2020 20:47    Procedures Procedures (including critical care time)  Medications Ordered in ED Medications  hydrALAZINE (APRESOLINE) tablet 50 mg (50  mg Oral Given 01/03/20 1316)  lisinopril (ZESTRIL) tablet 20 mg (20 mg Oral Given 01/03/20 1317)  amLODipine (NORVASC) tablet 10 mg (10 mg Oral Given 01/03/20 1317)  ondansetron (ZOFRAN-ODT) disintegrating tablet 4 mg (4 mg Oral Given 01/03/20 1514)  oxyCODONE-acetaminophen (PERCOCET/ROXICET) 5-325 MG per tablet 1 tablet (1 tablet Oral Given 01/03/20 1514)  hydrALAZINE (APRESOLINE) injection 10 mg (10 mg Intravenous Given 01/03/20 1516)    ED Course  I have reviewed the triage vital signs and the nursing notes.  Pertinent labs & imaging results that were available during my care of the patient  were reviewed by me and considered in my medical decision making (see chart for details).  Clinical Course as of Jan 02 1642  Wed Jan 03, 2020  1503 BP(!): 249/89 [MV]  1639 BP(!): 181/82 [MV]    Clinical Course User Index [MV] Eustaquio Maize, PA-C   MDM Rules/Calculators/A&P                          49 year old female presents to the ED today with sudden onset left foot pain after inverting her ankle on her porch 4 days ago with falling off the porch which approximately 2 steps high.  She attempted to go to 2 different ED's in the past 3 days however left prior to being seen due to extended wait times.  She did have an x-ray while in the waiting room yesterday which does show a fracture at the shaft of the fifth metatarsal.  Patient has been ambulating on her foot however reports worsening pain secondary to this.  On exam patient has swelling and ecchymosis to the lateral aspect of her foot with tenderness palpation specifically along the fifth metatarsal where her fracture is.  We will plan for crutches and posterior splint with Ortho follow-up at this time.  Incidentally patient's blood pressure is noted to be elevated at 214/98 with recheck 169/132.  She is on hydralazine 50 mg TID, amlodipine 10 mg, lisinopril 20 mg however reports she has missed all of her doses for the past 2 days. Will plan to  provide doses in the ED today with BP recheck. She has no complaints related to elevated BP. Do not feel she needs additional work up at this time.   Repeat BP after medications increased to 249/89. On further eval pt does report she stopped taking her amlodipine 2-3 weeks ago as she did not think she needed all 3 meds. Will provide IV hydralazine at this time and reevaluate. Pt strongly encouraged to take her BP meds as indicated and to follow up with her PCP before taking herself off of any meds as it appears she needs all 3.   Repeat BP after IV hydralazine 181/82. Pt continues to have no complaints. Will discharge at this time with ortho and PCP follow up. Pt instructed to not bare weight on foot until she sees ortho. She is in agreement with plan and stable for discharge home.   This note was prepared using Dragon voice recognition software and may include unintentional dictation errors due to the inherent limitations of voice recognition software.  Final Clinical Impression(s) / ED Diagnoses Final diagnoses:  Closed nondisplaced fracture of fifth metatarsal bone of left foot, initial encounter  Primary hypertension    Rx / DC Orders ED Discharge Orders         Ordered    ondansetron (ZOFRAN ODT) 4 MG disintegrating tablet  Every 8 hours PRN        01/03/20 1641    oxyCODONE-acetaminophen (PERCOCET/ROXICET) 5-325 MG tablet  Every 6 hours PRN        01/03/20 1641           Discharge Instructions     You were found to have a fracture of your foot today. Please keep splint on and use crutches to prevent baring weight onto the foot. DO NOT GET SPLINT WET.   Follow up with Dr. Aline Brochure orthopedist for further evaluation. Call to schedule an appointment for later this week/early next week.   While  at home please elevate your foot to reduce pain/swelling. I have also prescribed a short course of narcotic pain meds. Take as needed for SEVERE pain. I have also prescribed nausea  medication to take prior to pain meds if needed however would recommend taking the meds with food.   Follow up with your PCP regarding your blood pressure - take your blood pressure medication as prescribed.        Eustaquio Maize, PA-C 01/03/20 1643    Hayden Rasmussen, MD 01/03/20 [place]

## 2020-01-03 NOTE — ED Triage Notes (Signed)
Pt to er, pt states that she stepped on her porch wrong and hurt her L foot, pt states that she tried [facility] and [facility] for the past two days, the but the wait has been too long.

## 2020-01-05 ENCOUNTER — Encounter: Payer: Self-pay | Admitting: Orthopedic Surgery

## 2020-01-05 ENCOUNTER — Ambulatory Visit: Payer: Self-pay | Admitting: Orthopedic Surgery

## 2020-01-05 ENCOUNTER — Other Ambulatory Visit: Payer: Self-pay

## 2020-01-05 VITALS — BP 147/94 | HR 71 | Ht 65.0 in

## 2020-01-05 DIAGNOSIS — W19XXXA Unspecified fall, initial encounter: Secondary | ICD-10-CM

## 2020-01-05 DIAGNOSIS — S92355A Nondisplaced fracture of fifth metatarsal bone, left foot, initial encounter for closed fracture: Secondary | ICD-10-CM

## 2020-01-05 NOTE — Patient Instructions (Signed)
Please provide a work note, excusing her until the next scheduled visit.   1.  You can put weight through your heel; do not walk with your boot 2.  Ok to remove the boot if you are not moving around 3.  F/u 4 weeks, repeat XR

## 2020-01-05 NOTE — Progress Notes (Signed)
New Patient Visit  Assessment: Hannah Dougherty is a 49 y.o. female with the following: 1.  Left fifth metatarsal fracture  Plan: Hannah Dougherty sustained a spiral fracture to the shaft of the fifth metatarsal on her left foot.  She was appropriately splinted in the emergency department and presented to clinic today.  We reviewed the radiographs, and the expected outcome.  We removed the splint, and transitioned her to a walking boot.  She can be weightbearing as tolerated through her heel.  It is okay for her to remove the boot if she is not ambulating.  We have also provided her with a letter for work, excusing her from work for the next 4 weeks.  We will see her back in clinic in 4 weeks for repeat evaluation including x-rays.  Medications as needed.  Follow-up: Return in about 4 weeks (around 02/02/2020).  Please obtain x-rays of the left foot.  Subjective:  Chief Complaint  Patient presents with  . Foot Pain    Had a fall on 12/31/19. 10/10 today on pain.     History of Present Illness: Hannah Dougherty is a 49 y.o. female who presents to clinic today for evaluation of her left foot.  She states that she was stepping off of her front porch a few days ago, when she twisted her ankle.  She noted a popping sensation followed by immediate pain.  She presented the emergency department and was noted to have a fracture of the fifth metatarsal shaft.  She was placed into a splint, and has been nonweightbearing since.  She has been taking Tylenol and ibuprofen which helps with the pain.  She is try to elevate her foot as much as possible.  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  Review of Systems: No fevers or chills No numbness or tingling No chest pain No shortness of breath  Medical History:  Past Medical History:  Diagnosis Date  . Diabetes mellitus without complication ([place])   . Hypertension     Past Surgical History:  Procedure  Laterality Date  . TUBAL LIGATION    . VOCAL CORD LATERALIZATION, ENDOSCOPIC APPROACH W/ MLB      Family History  Problem Relation Age of Onset  . Hypertension Mother   . Diabetes Mother   . Kidney disease Mother   . Diabetes Father   . Hypertension Father   . Hypertension Maternal Grandmother   . Diabetes Maternal Grandmother   . Breast cancer Maternal Grandmother    Social History   Tobacco Use  . Smoking status: Current Some Day Smoker    Packs/day: 0.50    Types: Cigarettes  . Smokeless tobacco: Never Used  Substance Use Topics  . Alcohol use: Yes  . Drug use: Yes    Types: Marijuana    No Known Allergies  Current Meds  Medication Sig  . amLODipine (NORVASC) 10 MG tablet Take 1 tablet (10 mg total) by mouth daily.  . Dulaglutide (TRULICITY) 7.67 HA/1.9FX SOPN Inject 0.5 mLs (0.75 mg total) into the skin once a week.  Marland Kitchen glucose blood (RELION CONFIRM/MICRO TEST) test strip Use as instructed  . hydrALAZINE (APRESOLINE) 50 MG tablet Take 1 tablet (50 mg total) by mouth 3 (three) times daily.  . Insulin Pen Needle (PEN NEEDLES) 31G X 5 MM MISC 1 Dose by Does not apply route 4 (four) times daily -  before meals and at bedtime.  Marland Kitchen lisinopril (ZESTRIL)  20 MG tablet Take 1 tablet (20 mg total) by mouth daily.  . ondansetron (ZOFRAN ODT) 4 MG disintegrating tablet Take 1 tablet (4 mg total) by mouth every 8 (eight) hours as needed for nausea or vomiting.  Marland Kitchen oxyCODONE-acetaminophen (PERCOCET/ROXICET) 5-325 MG tablet Take 1 tablet by mouth every 6 (six) hours as needed for severe pain.  . potassium chloride (KLOR-CON) 10 MEQ tablet Take 1 tablet (10 mEq total) by mouth daily.  . pravastatin (PRAVACHOL) 20 MG tablet Take 1 tablet (20 mg total) by mouth daily.  Daryll Brod Lancet Devices 30G MISC 1 each by Does not apply route 3 (three) times daily.    Objective: BP (!) 147/94   Pulse 71   Ht 5\' 5"  (1.651 m)   LMP 10/28/2016 (Within Weeks)   BMI 32.95 kg/m   Physical  Exam:  General: Alert and oriented, no acute distress Gait: Unable to bear weight  Evaluation of the left foot demonstrates significant swelling over the dorsal and distal aspect of her foot.  There is some bruising and swelling around her toes.  Sensation is intact over the dorsal aspect of her foot.  She does have tenderness along the lateral border of her foot.  Active motion intact in the TA/EHL.    IMAGING: I personally ordered and reviewed the following images:  X-rays from the emergency department over the left foot demonstrates a spiral fracture through the shaft of the left fifth metatarsal with minimal displacement.  New Medications:  No orders of the defined types were placed in this encounter.     Mordecai Rasmussen, MD  01/05/2020 12:21 PM

## 2020-01-08 ENCOUNTER — Telehealth: Payer: Self-pay | Admitting: Orthopedic Surgery

## 2020-01-08 NOTE — Telephone Encounter (Signed)
Spoke with patient and gave her the instructions per Dr. Amedeo Kinsman. Updated letter was printed and given to [facility] up front. No other concerns.

## 2020-01-08 NOTE — Telephone Encounter (Signed)
Patient called and wants to know if we will change her note to light duty.  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  She has just started this job and she doesn't want to lose it.     She wants to start doing this tomorrow 01/09/20   Please advise.

## 2020-01-08 NOTE — Telephone Encounter (Signed)
Left message to clarify with patient about return to work note and dates.

## 2020-01-09 ENCOUNTER — Ambulatory Visit (INDEPENDENT_AMBULATORY_CARE_PROVIDER_SITE_OTHER): Payer: Self-pay | Admitting: Primary Care

## 2020-01-12 ENCOUNTER — Other Ambulatory Visit (INDEPENDENT_AMBULATORY_CARE_PROVIDER_SITE_OTHER): Payer: Self-pay | Admitting: Primary Care

## 2020-01-12 MED ORDER — TRULICITY 0.75 MG/0.5ML ~~LOC~~ SOAJ: 0.7500 mg | SUBCUTANEOUS | 1 refills | Status: DC

## 2020-01-12 MED FILL — !TRULICITY 0.75 MG/0.5 ML P: 0.75 | 28 days supply | Qty: 2 | Fill #2

## 2020-01-12 NOTE — Telephone Encounter (Signed)
Medication: Dulaglutide (TRULICITY) 0.30 SP/2.3RA SOPN [[id number]]   Has the patient contacted their pharmacy? YES (Agent: If no, request that the patient contact the pharmacy for the refill.) (Agent: If yes, when and what did the pharmacy advise?)  Preferred Pharmacy (with phone number or street name): [facility], [address] [place] [address] Phone: [phone] Fax: [phone] Hours: Not open 24 hours    Agent: Please be advised that RX refills may take up to 3 business days. We ask that you follow-up with your pharmacy.

## 2020-01-15 ENCOUNTER — Other Ambulatory Visit: Payer: Self-pay

## 2020-01-15 ENCOUNTER — Ambulatory Visit (INDEPENDENT_AMBULATORY_CARE_PROVIDER_SITE_OTHER): Payer: Self-pay | Admitting: Primary Care

## 2020-01-15 ENCOUNTER — Encounter (INDEPENDENT_AMBULATORY_CARE_PROVIDER_SITE_OTHER): Payer: Self-pay | Admitting: Primary Care

## 2020-01-15 VITALS — BP 149/87 | HR 64 | Temp 97.3°F | Ht 65.0 in | Wt 198.0 lb

## 2020-01-15 DIAGNOSIS — I10 Essential (primary) hypertension: Secondary | ICD-10-CM

## 2020-01-15 DIAGNOSIS — S92355D Nondisplaced fracture of fifth metatarsal bone, left foot, subsequent encounter for fracture with routine healing: Secondary | ICD-10-CM

## 2020-01-15 DIAGNOSIS — Z794 Long term (current) use of insulin: Secondary | ICD-10-CM

## 2020-01-15 DIAGNOSIS — E119 Type 2 diabetes mellitus without complications: Secondary | ICD-10-CM

## 2020-01-15 DIAGNOSIS — E782 Mixed hyperlipidemia: Secondary | ICD-10-CM

## 2020-01-15 DIAGNOSIS — R7989 Other specified abnormal findings of blood chemistry: Secondary | ICD-10-CM

## 2020-01-15 LAB — POCT GLYCOSYLATED HEMOGLOBIN (HGB A1C): Hemoglobin A1C: 7.9 % — AB (ref 4.0–5.6)

## 2020-01-15 LAB — GLUCOSE, POCT (MANUAL RESULT ENTRY): POC Glucose: 174 mg/dl — AB (ref 70–99)

## 2020-01-15 MED ORDER — LISINOPRIL 20 MG PO TABS: 20.0000 mg | ORAL_TABLET | Freq: Every day | ORAL | 3 refills | Status: DC

## 2020-01-15 MED ORDER — AMLODIPINE BESYLATE 10 MG PO TABS: 10.0000 mg | ORAL_TABLET | Freq: Every day | ORAL | 3 refills | Status: DC

## 2020-01-15 MED ORDER — HYDRALAZINE HCL 50 MG PO TABS: 50.0000 mg | ORAL_TABLET | Freq: Three times a day (TID) | ORAL | 1 refills | Status: DC

## 2020-01-15 MED FILL — AMLODIPINE BESYLATE 10 MG T: 10 | 30 days supply | Qty: 30 | Fill #0

## 2020-01-15 MED FILL — LISINOPRIL 20 MG TABLET: 20 | 30 days supply | Qty: 30 | Fill #0

## 2020-01-15 MED FILL — hydrALAZINE HCL 50 MG TABS: 50 | 30 days supply | Qty: 90 | Fill #0

## 2020-01-15 NOTE — Progress Notes (Signed)
[facility]  Subjective:  Patient ID: Hannah Dougherty, female    DOB: 01-May-1970  Age: 49 y.o. MRN: [id number]  CC: Follow-up (DM/BP)   HPI Hannah Dougherty is a 49 year old obese female who presents for Follow-up of diabetes. Patient check blood sugar at home reading fasting 140-170 .  Compliant with meds - Yes Checking CBGs? Yes  Fasting avg - 140-170    Exercising regularly? - No Watching carbohydrate intake? - Yes Neuropathy ? - No Hypoglycemic events - No  - Recovers with :   Pertinent ROS:  Polyuria - No Polydipsia - No Vision problems - Yes  Medications as noted below. Taking them regularly without complication/adverse reaction being reported today.  Bursae so has a low hemoglobin (who he was abdominal Trulicity 0.5 mg weekly insomnia History Hannah Dougherty  has a past medical history of Diabetes mellitus without complication ([place]) and Hypertension.   She has a past surgical history that includes Tubal ligation and Vocal cord lateralization, endoscopic approach w/ MLB.   Her family history includes Breast cancer in her maternal grandmother; Diabetes in her father, maternal grandmother, and mother; Hypertension in her father, maternal grandmother, and mother; Kidney disease in her mother.She reports that she has been smoking cigarettes. She has been smoking about 1-2 weekly if stressed . She has never used smokeless tobacco. She reports no alcohol use. She reports current drug use. Drug: Marijuana.  Current Outpatient Medications on File Prior to Visit  Medication Sig Dispense Refill  . amLODipine (NORVASC) 10 MG tablet Take 1 tablet (10 mg total) by mouth daily. 90 tablet 3  . Dulaglutide (TRULICITY) 1.51 VO/1.6WV SOPN Inject 0.75 mg into the skin once a week. 0.75 mL 1  . glucose blood (RELION CONFIRM/MICRO TEST) test strip Use as instructed 100 each 0  . hydrALAZINE (APRESOLINE) 50 MG tablet Take 1 tablet (50 mg total) by mouth 3 (three) times daily. 90  tablet 1  . lisinopril (ZESTRIL) 20 MG tablet Take 1 tablet (20 mg total) by mouth daily. 90 tablet 3  . oxyCODONE-acetaminophen (PERCOCET/ROXICET) 5-325 MG tablet Take 1 tablet by mouth every 6 (six) hours as needed for severe pain. 20 tablet 0  . potassium chloride (KLOR-CON) 10 MEQ tablet Take 1 tablet (10 mEq total) by mouth daily. 30 tablet 0  . pravastatin (PRAVACHOL) 20 MG tablet Take 1 tablet (20 mg total) by mouth daily. 90 tablet 1  . ReliOn Lancet Devices 30G MISC 1 each by Does not apply route 3 (three) times daily. 100 each 0   No current facility-administered medications on file prior to visit.    ROS Review of Systems  Musculoskeletal: Positive for gait problem.        Closed nondisplaced fracture of fifth metatarsal bone of left foot wears a ulna boot followed by ortho painful at times     Objective:  BP (!) 149/87 (BP Location: Right Arm, Patient Position: Sitting, Cuff Size: Large)   Pulse 64   Temp (!) 97.3 F (36.3 C) (Temporal)   Ht 5\' 5"  (1.651 m)   Wt 198 lb (89.8 kg) Comment: with boot on left foot  LMP 10/28/2016 (Within Weeks)   SpO2 96%   BMI 32.95 kg/m   BP Readings from Last 3 Encounters:  01/15/20 (!) 149/87  01/05/20 (!) 147/94  01/03/20 (!) 181/82    Wt Readings from Last 3 Encounters:  01/15/20 198 lb (89.8 kg)  01/03/20 198 lb (89.8 kg)  01/02/20 195 lb (88.5 kg)  Physical Exam Vitals reviewed.  Constitutional:      Appearance: She is obese.  HENT:     Head: Normocephalic.     Right Ear: Tympanic membrane normal.     Left Ear: Tympanic membrane normal.  Eyes:     Extraocular Movements: Extraocular movements intact.     Pupils: Pupils are equal, round, and reactive to light.  Cardiovascular:     Rate and Rhythm: Normal rate and regular rhythm.  Pulmonary:     Effort: Pulmonary effort is normal.     Breath sounds: Normal breath sounds.  Abdominal:     General: Bowel sounds are normal. There is distension.     Palpations:  Abdomen is soft.  Musculoskeletal:     Cervical back: Normal range of motion and neck supple.     Comments: Closed nondisplaced fracture of fifth metatarsal bone of left foot  Skin:    General: Skin is warm and dry.  Neurological:     Mental Status: She is alert and oriented to person, place, and time.  Psychiatric:        Mood and Affect: Mood normal.        Behavior: Behavior normal.        Thought Content: Thought content normal.        Judgment: Judgment normal.     Lab Results  Component Value Date   HGBA1C 7.9 (A) 01/15/2020   HGBA1C 8.5 (A) 10/09/2019   HGBA1C 9.6 (A) 06/28/2019    Lab Results  Component Value Date   WBC 9.9 08/16/2019   HGB 12.2 08/16/2019   HCT 38.4 08/16/2019   PLT 240 08/16/2019   GLUCOSE 280 (H) 08/16/2019   CHOL 197 06/28/2019   TRIG 102 06/28/2019   HDL 49 06/28/2019   LDLCALC 130 (H) 06/28/2019   ALT 16 08/16/2019   AST 17 08/16/2019   NA 135 08/16/2019   K 3.8 08/16/2019   CL 101 08/16/2019   CREATININE 0.92 08/16/2019   BUN 18 08/16/2019   CO2 23 08/16/2019   TSH 0.310 (L) 08/16/2019   HGBA1C 7.9 (A) 01/15/2020     Assessment & Plan:   Hannah Dougherty was seen today for follow-up.  Diagnoses and all orders for this visit:  Type 2 diabetes mellitus without complication, with long-term current use of insulin ([place]) ADA recommends the following therapeutic goals for glycemic control related to A1c measurements: Goal of therapy: Less than 6.5 hemoglobin A1c. Continue foods that are high in carbohydrates are the following rice, potatoes, breads, sugars, and pastas.  Reduction in the intake (eating) will assist in lowering your blood sugars. -     HgB A1c 8.5 -     Glucose (CBG)  Essential hypertension Elevated blood pressure at this visit.  She does admit to taking hydralazine twice a day but going for she will be taking it 3 times a day as prescribed.  Blood pressure goal is 130/80 discussed the risk of stroke and heart attack with other  comorbidities African-American, obese, hypertension, and diabetes.  She understands the risk we will continue current medications hydralazine 50 mg 3 times a day, lisinopril 20 mg a day, and amlodipine 10 mg daily\  Mixed hyperlipidemia She is currently taking pravastatin 20 mg for elevated cholesterol she is also monitoring her fatty food intake  red meat, cheese, milk and increase fiber like whole grains and veggies.  Low TSH level Recheck thyroid level previously elevated TSH  Closed nondisplaced fracture of fifth metatarsal bone  of left foot, initial encounter Currently in a Unna boot she does have pain and unstable gait increased risk for falls followed by orthopedics  I have discontinued Hannah Dougherty's Pen Needles and ondansetron. I am also having her maintain her ReliOn Confirm/micro Test, ReliOn Lancet Devices 30G, potassium chloride, hydrALAZINE, pravastatin, amLODipine, lisinopril, oxyCODONE-acetaminophen, and Trulicity.  No orders of the defined types were placed in this encounter.    Follow-up:   Return in about 3 months (around 04/16/2020) for DM .  The above assessment and management plan was discussed with the patient. The patient verbalized understanding of and has agreed to the management plan. Patient is aware to call the clinic if symptoms fail to improve or worsen. Patient is aware when to return to the clinic for a follow-up visit. Patient educated on when it is appropriate to go to the emergency department.   Juluis Mire, NP-C

## 2020-01-15 NOTE — Progress Notes (Signed)
7

## 2020-01-15 NOTE — Patient Instructions (Signed)

## 2020-01-16 LAB — LIPID PANEL
Chol/HDL Ratio: 3.6 ratio (ref 0.0–4.4)
Cholesterol, Total: 221 mg/dL — ABNORMAL HIGH (ref 100–199)
HDL: 61 mg/dL (ref >39)
LDL Chol Calc (NIH): 146 mg/dL — ABNORMAL HIGH (ref 0–99)
Triglycerides: 82 mg/dL (ref 0–149)
VLDL Cholesterol Cal: 14 mg/dL (ref 5–40)

## 2020-01-16 LAB — TSH+FREE T4
Free T4: 1.12 ng/dL (ref 0.82–1.77)
TSH: 0.371 u[IU]/mL — ABNORMAL LOW (ref 0.450–4.500)

## 2020-01-17 ENCOUNTER — Other Ambulatory Visit (INDEPENDENT_AMBULATORY_CARE_PROVIDER_SITE_OTHER): Payer: Self-pay | Admitting: Primary Care

## 2020-01-17 ENCOUNTER — Telehealth (INDEPENDENT_AMBULATORY_CARE_PROVIDER_SITE_OTHER): Payer: Self-pay

## 2020-01-17 DIAGNOSIS — E782 Mixed hyperlipidemia: Secondary | ICD-10-CM

## 2020-01-17 MED ORDER — PRAVASTATIN SODIUM 40 MG PO TABS: 40.0000 mg | ORAL_TABLET | Freq: Every day | ORAL | 1 refills | Status: DC

## 2020-01-17 MED FILL — PRAVASTATIN NA 40 MG TAB: 40 | 30 days supply | Qty: 30 | Fill #0

## 2020-01-17 NOTE — Telephone Encounter (Signed)
-----   Message from [name], NP sent at 01/17/2020 11:08 AM EDT ----- Elevated cholesterol that can lead to heart attack and stroke. To lower your number you can decrease your fatty foods, red meat, cheese, milk and increase fiber like whole grains and veggies.  Increased pravastatin from 20mg  to 40mg  take at bedtime

## 2020-01-17 NOTE — Telephone Encounter (Signed)
Patient confirmed date of birth. She is aware that her cholesterol is elevated and can lead to heart attack and stroke. To lower your number you can decrease your fatty foods, red meat, cheese, milk and increase fiber like whole grains and veggies. Increased pravastatin from 20mg  to 40mg  take at bedtime. Advised patient to take 2 of the 20 mg until she finishes the bottle and then pick up new Rx for 40 mg. She verbalized understanding. Nat Christen, CMA

## 2020-01-29 ENCOUNTER — Telehealth: Payer: Self-pay | Admitting: Orthopedic Surgery

## 2020-01-29 DIAGNOSIS — S92355A Nondisplaced fracture of fifth metatarsal bone, left foot, initial encounter for closed fracture: Secondary | ICD-10-CM

## 2020-01-29 NOTE — Telephone Encounter (Signed)
Orders placed for x-ray to be done at [facility]. No other concerns.

## 2020-01-29 NOTE — Telephone Encounter (Signed)
Per Dr. Amedeo Kinsman to order x-rays at [facility] and I called patient to let her know that we would have to get x-rays at [facility] prior to the appointment and I offered to change the appointment and she hung the phone up. Waiting on a call back.

## 2020-01-30 ENCOUNTER — Ambulatory Visit: Payer: Self-pay | Admitting: Orthopedic Surgery

## 2020-01-31 ENCOUNTER — Other Ambulatory Visit (INDEPENDENT_AMBULATORY_CARE_PROVIDER_SITE_OTHER): Payer: Self-pay | Admitting: Primary Care

## 2020-01-31 ENCOUNTER — Other Ambulatory Visit: Payer: Self-pay | Admitting: Pharmacist

## 2020-01-31 MED ORDER — TRULICITY 0.75 MG/0.5ML ~~LOC~~ SOAJ: 0.7500 mg | SUBCUTANEOUS | 3 refills | Status: DC

## 2020-01-31 NOTE — Telephone Encounter (Signed)
Medication Refill - Medication: Trulicity   Has the patient contacted their pharmacy? Yes.   PT states that she is needing to have this sent in today. Please advise.  (Agent: If no, request that the patient contact the pharmacy for the refill.) (Agent: If yes, when and what did the pharmacy advise?)  Preferred Pharmacy (with phone number or street name):  [facility], [address]  [place] [address]  Phone: [phone] Fax: [phone]  Hours: Not open 24 hours     Agent: Please be advised that RX refills may take up to 3 business days. We ask that you follow-up with your pharmacy.

## 2020-02-06 ENCOUNTER — Telehealth: Payer: Self-pay | Admitting: Orthopedic Surgery

## 2020-02-06 NOTE — Telephone Encounter (Signed)
Thanks Barnet Pall  We can provide her with a letter when she comes to clinic next week.    Wilber Fini A. Amedeo Kinsman, MD [place] [address] [place],  [place]  [address] Phone: [phone] Fax: [phone]

## 2020-02-06 NOTE — Telephone Encounter (Signed)
Patient called and was requesting a letter from being out from when she missed the last visit. She missed her last appointment on 01/30/2020. She wants an extension until her next follow up on 02/14/2020. Please advise.

## 2020-02-14 ENCOUNTER — Ambulatory Visit: Payer: Self-pay | Admitting: Orthopedic Surgery

## 2020-02-16 ENCOUNTER — Emergency Department (HOSPITAL_BASED_OUTPATIENT_CLINIC_OR_DEPARTMENT_OTHER): Payer: Self-pay

## 2020-02-16 ENCOUNTER — Inpatient Hospital Stay (HOSPITAL_BASED_OUTPATIENT_CLINIC_OR_DEPARTMENT_OTHER)
Admission: EM | Admit: 2020-02-16 | Discharge: 2020-02-18 | DRG: 305 | Disposition: A | Discharge status: Home / Self Care | Payer: Self-pay | Attending: Internal Medicine | Admitting: Internal Medicine

## 2020-02-16 ENCOUNTER — Encounter (HOSPITAL_BASED_OUTPATIENT_CLINIC_OR_DEPARTMENT_OTHER): Payer: Self-pay | Admitting: Emergency Medicine

## 2020-02-16 ENCOUNTER — Other Ambulatory Visit: Payer: Self-pay

## 2020-02-16 DIAGNOSIS — A599 Trichomoniasis, unspecified: Secondary | ICD-10-CM | POA: Diagnosis present

## 2020-02-16 DIAGNOSIS — E876 Hypokalemia: Secondary | ICD-10-CM | POA: Diagnosis not present

## 2020-02-16 DIAGNOSIS — Z8249 Family history of ischemic heart disease and other diseases of the circulatory system: Secondary | ICD-10-CM

## 2020-02-16 DIAGNOSIS — E669 Obesity, unspecified: Secondary | ICD-10-CM | POA: Diagnosis present

## 2020-02-16 DIAGNOSIS — F17210 Nicotine dependence, cigarettes, uncomplicated: Secondary | ICD-10-CM | POA: Diagnosis present

## 2020-02-16 DIAGNOSIS — I160 Hypertensive urgency: Principal | ICD-10-CM | POA: Diagnosis present

## 2020-02-16 DIAGNOSIS — Z6831 Body mass index (BMI) 31.0-31.9, adult: Secondary | ICD-10-CM

## 2020-02-16 DIAGNOSIS — Z9114 Patient's other noncompliance with medication regimen: Secondary | ICD-10-CM

## 2020-02-16 DIAGNOSIS — E1169 Type 2 diabetes mellitus with other specified complication: Secondary | ICD-10-CM | POA: Diagnosis present

## 2020-02-16 DIAGNOSIS — Z841 Family history of disorders of kidney and ureter: Secondary | ICD-10-CM

## 2020-02-16 DIAGNOSIS — Z20822 Contact with and (suspected) exposure to covid-19: Secondary | ICD-10-CM | POA: Diagnosis present

## 2020-02-16 DIAGNOSIS — I10 Essential (primary) hypertension: Secondary | ICD-10-CM | POA: Diagnosis present

## 2020-02-16 DIAGNOSIS — Z79899 Other long term (current) drug therapy: Secondary | ICD-10-CM

## 2020-02-16 DIAGNOSIS — Z803 Family history of malignant neoplasm of breast: Secondary | ICD-10-CM

## 2020-02-16 DIAGNOSIS — Z833 Family history of diabetes mellitus: Secondary | ICD-10-CM

## 2020-02-16 DIAGNOSIS — E1165 Type 2 diabetes mellitus with hyperglycemia: Secondary | ICD-10-CM | POA: Diagnosis present

## 2020-02-16 DIAGNOSIS — N83202 Unspecified ovarian cyst, left side: Secondary | ICD-10-CM | POA: Diagnosis present

## 2020-02-16 DIAGNOSIS — N2889 Other specified disorders of kidney and ureter: Secondary | ICD-10-CM | POA: Diagnosis present

## 2020-02-16 DIAGNOSIS — E785 Hyperlipidemia, unspecified: Secondary | ICD-10-CM | POA: Diagnosis present

## 2020-02-16 DIAGNOSIS — E119 Type 2 diabetes mellitus without complications: Secondary | ICD-10-CM

## 2020-02-16 LAB — COMPREHENSIVE METABOLIC PANEL
ALT: 12 U/L (ref 0–44)
AST: 17 U/L (ref 15–41)
Albumin: 4.1 g/dL (ref 3.5–5.0)
Alkaline Phosphatase: 76 U/L (ref 38–126)
Anion gap: 11 (ref 5–15)
BUN: 12 mg/dL (ref 6–20)
CO2: 25 mmol/L (ref 22–32)
Calcium: 9.1 mg/dL (ref 8.9–10.3)
Chloride: 98 mmol/L (ref 98–111)
Creatinine, Ser: 1.07 mg/dL — ABNORMAL HIGH (ref 0.44–1.00)
GFR, Estimated: >60 mL/min (ref >60)
Glucose, Bld: 269 mg/dL — ABNORMAL HIGH (ref 70–99)
Potassium: 3.6 mmol/L (ref 3.5–5.1)
Sodium: 134 mmol/L — ABNORMAL LOW (ref 135–145)
Total Bilirubin: 0.4 mg/dL (ref 0.3–1.2)
Total Protein: 7.8 g/dL (ref 6.5–8.1)

## 2020-02-16 LAB — CBC
HCT: 38.3 % (ref 36.0–46.0)
Hemoglobin: 12.6 g/dL (ref 12.0–15.0)
MCH: 27.9 pg (ref 26.0–34.0)
MCHC: 32.9 g/dL (ref 30.0–36.0)
MCV: 84.9 fL (ref 80.0–100.0)
Platelets: 302 10*3/uL (ref 150–400)
RBC: 4.51 MIL/uL (ref 3.87–5.11)
RDW: 14.6 % (ref 11.5–15.5)
WBC: 9 10*3/uL (ref 4.0–10.5)
nRBC: 0 % (ref 0.0–0.2)

## 2020-02-16 LAB — I-STAT VENOUS BLOOD GAS, ED
Acid-Base Excess: 3 mmol/L — ABNORMAL HIGH (ref 0.0–2.0)
Bicarbonate: 27.4 mmol/L (ref 20.0–28.0)
Calcium, Ion: 1.15 mmol/L (ref 1.15–1.40)
HCT: 41 % (ref 36.0–46.0)
Hemoglobin: 13.9 g/dL (ref 12.0–15.0)
O2 Saturation: 67 %
Patient temperature: 98.1
Potassium: 3.9 mmol/L (ref 3.5–5.1)
Sodium: 136 mmol/L (ref 135–145)
TCO2: 29 mmol/L (ref 22–32)
pCO2, Ven: 41 mmHg — ABNORMAL LOW (ref 44.0–60.0)
pH, Ven: 7.433 — ABNORMAL HIGH (ref 7.250–7.430)
pO2, Ven: 33 mmHg (ref 32.0–45.0)

## 2020-02-16 LAB — URINALYSIS, ROUTINE W REFLEX MICROSCOPIC
Bilirubin Urine: NEGATIVE
Glucose, UA: >=500 mg/dL — AB
Hgb urine dipstick: NEGATIVE
Ketones, ur: NEGATIVE mg/dL
Nitrite: NEGATIVE
Protein, ur: 30 mg/dL — AB
Specific Gravity, Urine: 1.02 (ref 1.005–1.030)
pH: 7.5 (ref 5.0–8.0)

## 2020-02-16 LAB — RESP PANEL BY RT-PCR (FLU A&B, COVID) ARPGX2
Influenza A by PCR: NEGATIVE
Influenza B by PCR: NEGATIVE
SARS Coronavirus 2 by RT PCR: NEGATIVE

## 2020-02-16 LAB — LIPASE, BLOOD: Lipase: 99 U/L — ABNORMAL HIGH (ref 11–51)

## 2020-02-16 LAB — GLUCOSE, CAPILLARY: Glucose-Capillary: 312 mg/dL — ABNORMAL HIGH (ref 70–99)

## 2020-02-16 LAB — CBG MONITORING, ED
Glucose-Capillary: 250 mg/dL — ABNORMAL HIGH (ref 70–99)
Glucose-Capillary: 291 mg/dL — ABNORMAL HIGH (ref 70–99)

## 2020-02-16 LAB — URINALYSIS, MICROSCOPIC (REFLEX)

## 2020-02-16 LAB — PREGNANCY, URINE: Preg Test, Ur: NEGATIVE

## 2020-02-16 LAB — BETA-HYDROXYBUTYRIC ACID: Beta-Hydroxybutyric Acid: 0.23 mmol/L (ref 0.05–0.27)

## 2020-02-16 LAB — MRSA PCR SCREENING: MRSA by PCR: NEGATIVE

## 2020-02-16 IMAGING — CT CT ABD-PELV W/ CM
2 of 5 series · 16 of 46 positions shown, 18 images · IV contrast (Omnipaque)
Comparison: None.

CLINICAL DATA: Nausea vomiting.

EXAM:
CT ABDOMEN AND PELVIS WITH CONTRAST
TECHNIQUE: Multidetector CT imaging of the abdomen and pelvis was performed
using the standard protocol following bolus administration of
intravenous contrast.
CONTRAST:  100mL OMNIPAQUE IOHEXOL 300 MG/ML  SOLN

[Series 3: axial st · axial · 0.88mm/px · z∈[-408,-32]mm · 13 of 85 slices shown, 15 images]
[im 5/85  soft-tissue]
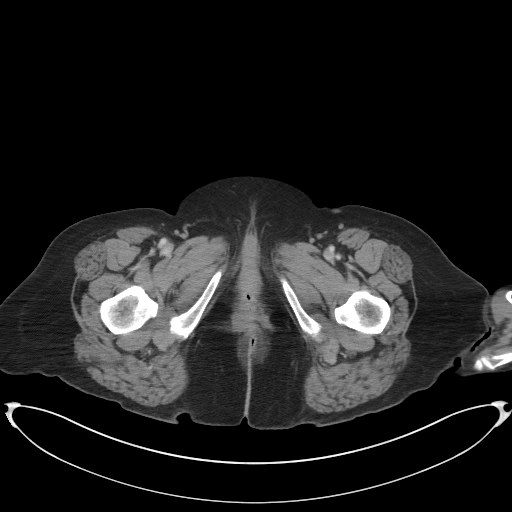
[im 5/85  bone]
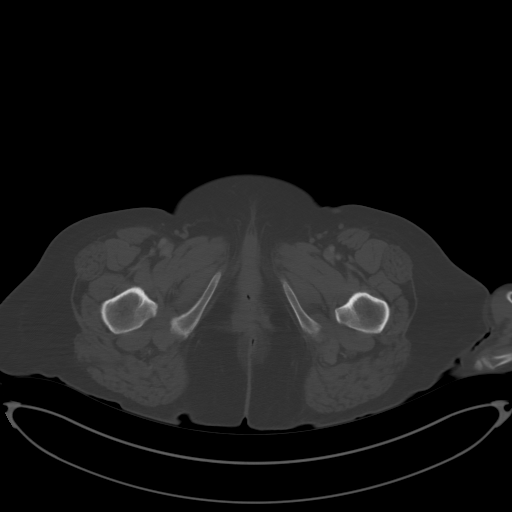
[im 10/85  soft-tissue]
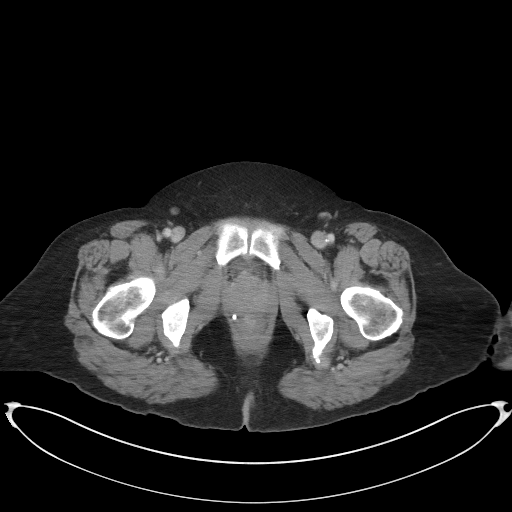
[im 19/85  soft-tissue]
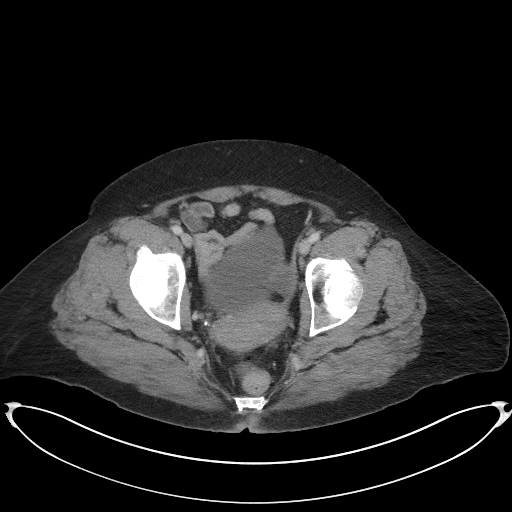
[im 24/85  soft-tissue]
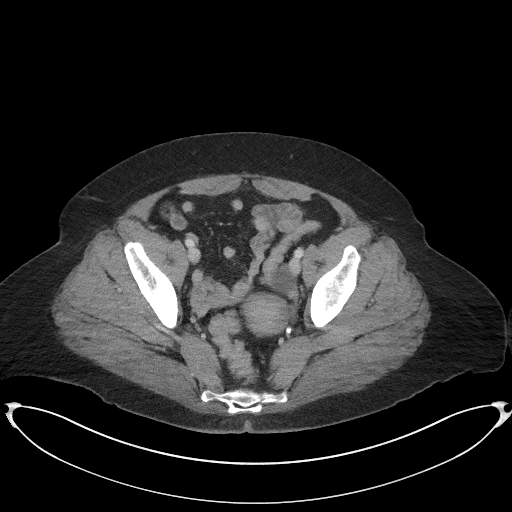
[im 29/85  soft-tissue]
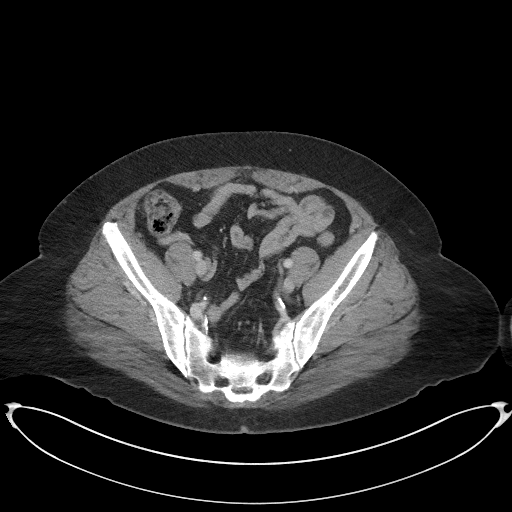
[im 38/85  soft-tissue]
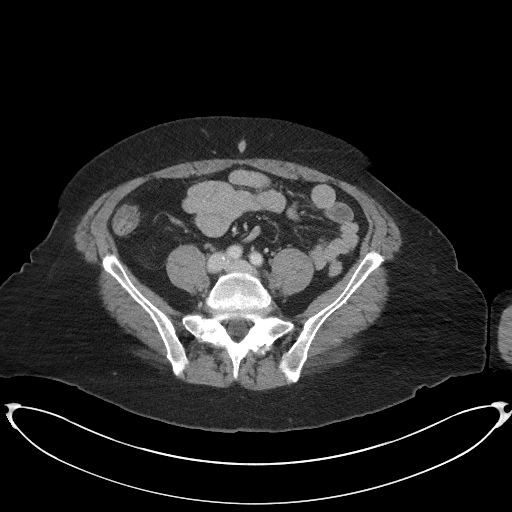
[im 43/85  soft-tissue]
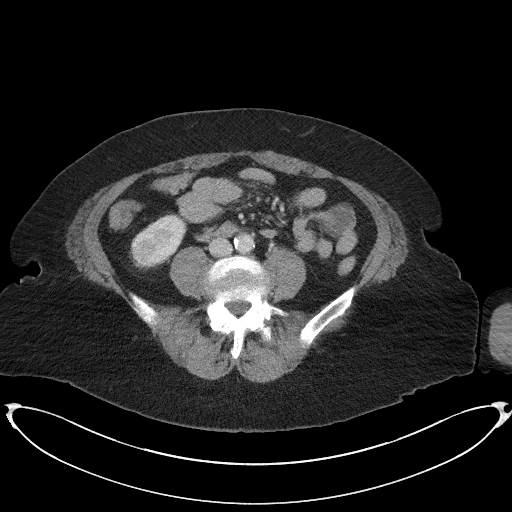
[im 47/85  soft-tissue]
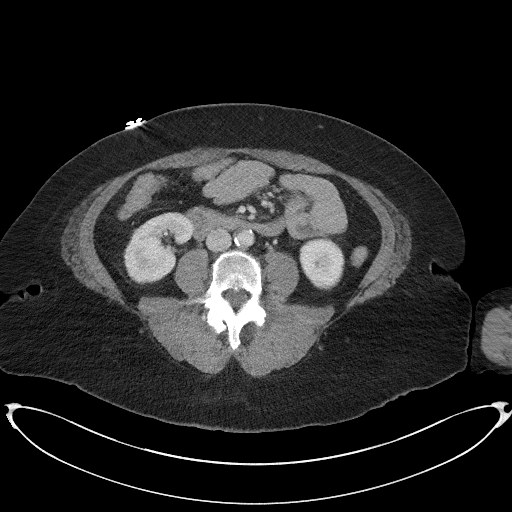
[im 57/85  soft-tissue]
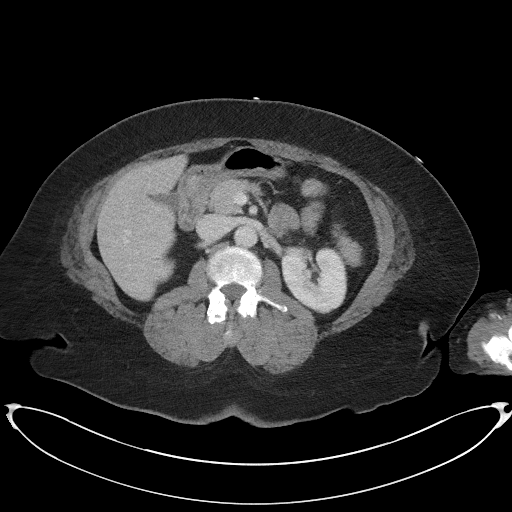
[im 57/85  bone]
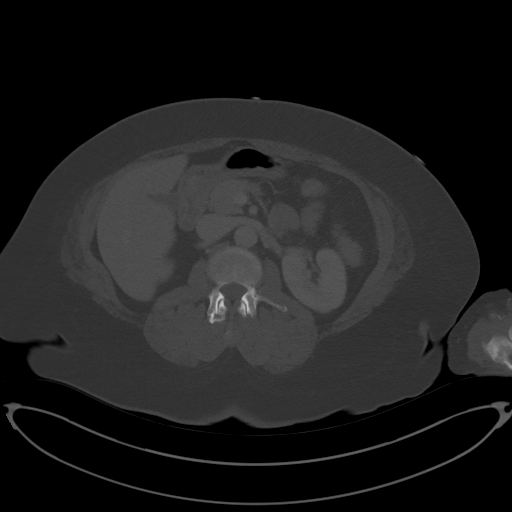
[im 61/85  soft-tissue]
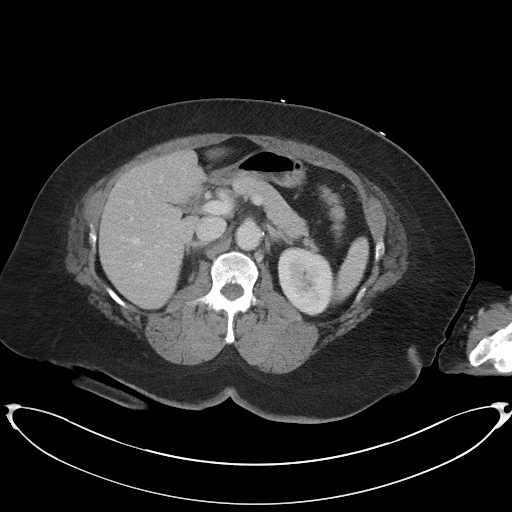
[im 66/85  soft-tissue]
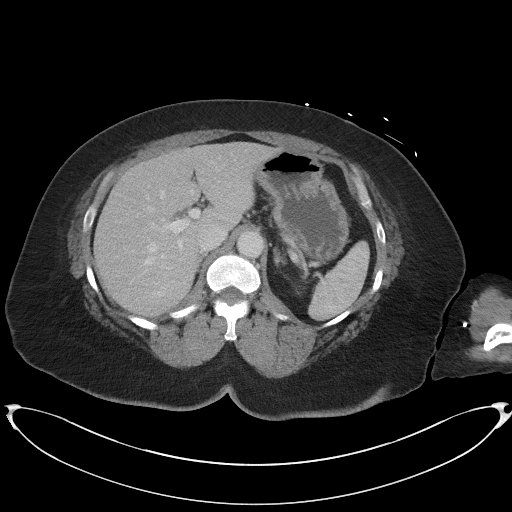
[im 75/85  soft-tissue]
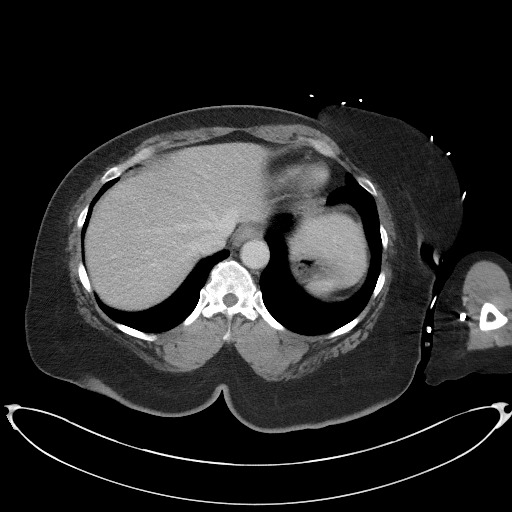
[im 80/85  soft-tissue]
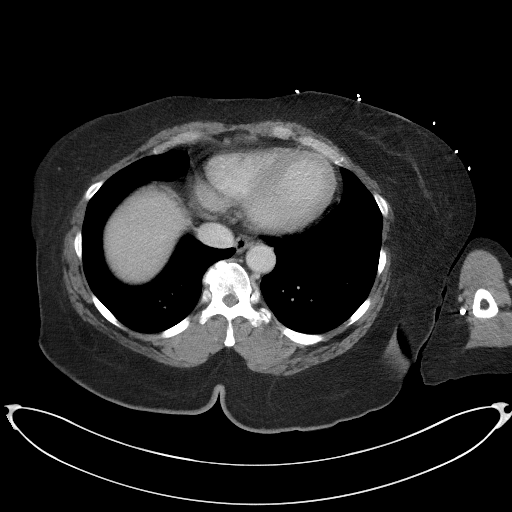

[Series 5: coronal st · coronal · 0.89mm/px · 3 of 110 slices shown]
[im 37/110  soft-tissue]
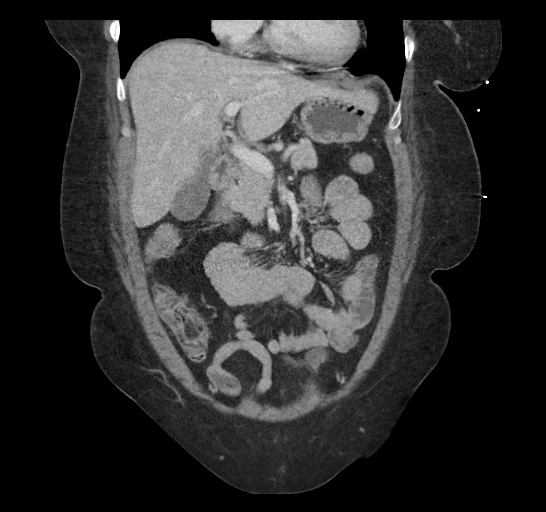
[im 49/110  soft-tissue]
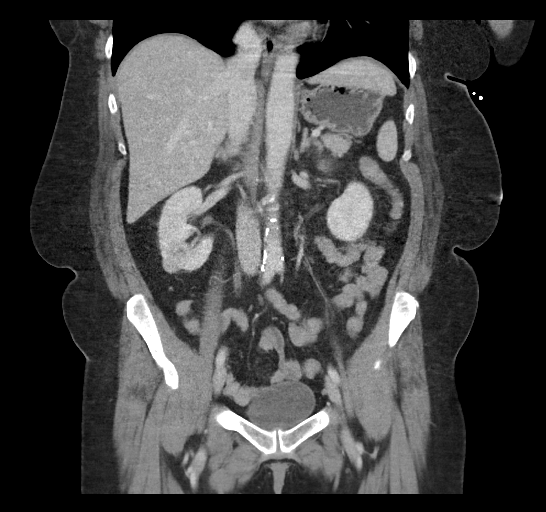
[im 61/110  soft-tissue]
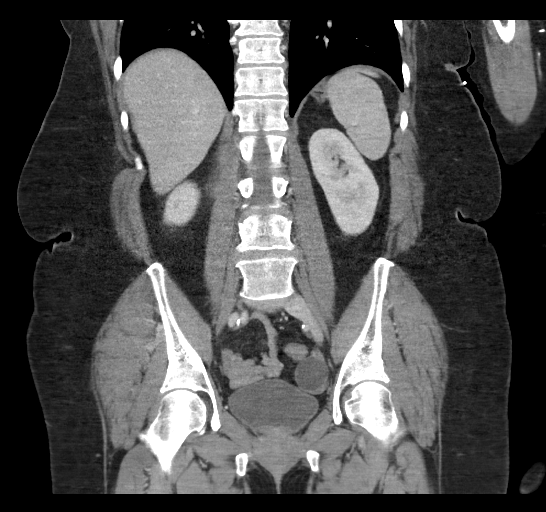

[16 of 46 positions shown; findings below may reference images not displayed]

FINDINGS: Lower chest: Unremarkable.

Hepatobiliary: No suspicious focal abnormality within the liver
parenchyma. There is no evidence for gallstones, gallbladder wall
thickening, or pericholecystic fluid. No intrahepatic or
extrahepatic biliary dilation.

Pancreas: No focal mass lesion. No dilatation of the main duct. No
intraparenchymal cyst. No peripancreatic edema.

Spleen: No splenomegaly. No focal mass lesion.

Adrenals/Urinary Tract: No adrenal nodule or mass. 2.4 x 1.7 x
cm heterogeneously enhancing soft tissue mass is identified in the
lower pole right kidney. Left kidney unremarkable. No evidence for
hydroureter. The urinary bladder appears normal for the degree of
distention.

Stomach/Bowel: Stomach is unremarkable. No gastric wall thickening.
No evidence of outlet obstruction. Duodenum is normally positioned
as is the ligament of Treitz. No small bowel wall thickening. No
small bowel dilatation. The terminal ileum is normal. The appendix
is normal. Colon is diffusely decompressed without appreciable wall
thickening or pericolonic edema/inflammation.

Vascular/Lymphatic: There is abdominal aortic atherosclerosis
without aneurysm. There is no gastrohepatic or hepatoduodenal
ligament lymphadenopathy. No retroperitoneal or mesenteric
lymphadenopathy. No pelvic sidewall lymphadenopathy.

Reproductive: Uterus unremarkable. 3.2 Cm benign appearing cyst
noted in the left ovary. Right ovary unremarkable.

Other: No intraperitoneal free fluid.

Musculoskeletal: No worrisome lytic or sclerotic osseous
abnormality.
IMPRESSION: 1. 2.6 cm heterogeneously enhancing soft tissue mass in the lower
pole right kidney, highly suspicious for renal cell carcinoma. Right
renal vein is patent. No evidence for metastatic disease.
2. No findings to explain the history of nausea and vomiting.
3. 3.2 cm benign appearing cyst in left ovary. Given patient age and
lesion size, follow-up ultrasound recommended to further evaluate.
This recommendation follows ACR consensus guidelines: White Paper of
the ACR Incidental Findings Committee II on Adnexal Findings. [HOSPITAL] [DATE].
4.  Aortic Atherosclerois ([1P]-170.0)

Findings were called by me at the time of interpretation on
[DATE] at [DATE] to provider RTOYOTA , who verbally
acknowledged these results.

## 2020-02-16 MED ORDER — IOHEXOL 300 MG/ML  SOLN
100.0000 mL | Freq: Once | INTRAMUSCULAR | Status: AC
  Administered 2020-02-16: 100 mL via INTRAVENOUS

## 2020-02-16 MED ORDER — SODIUM CHLORIDE 0.9 % IV BOLUS
1000.0000 mL | Freq: Once | INTRAVENOUS | Status: AC
  Administered 2020-02-16: 1000 mL via INTRAVENOUS

## 2020-02-16 MED ORDER — ACETAMINOPHEN 650 MG RE SUPP: 650.0000 mg | Freq: Four times a day (QID) | RECTAL | Status: DC | PRN

## 2020-02-16 MED ORDER — HYDRALAZINE HCL 50 MG PO TABS
50.0000 mg | ORAL_TABLET | Freq: Three times a day (TID) | ORAL | Status: DC
  Administered 2020-02-17 – 2020-02-18 (×5): 50 mg via ORAL
  Filled 2020-02-16 (×6): qty 1

## 2020-02-16 MED ORDER — PRAVASTATIN SODIUM 20 MG PO TABS
40.0000 mg | ORAL_TABLET | Freq: Every day | ORAL | Status: DC
  Administered 2020-02-17 – 2020-02-18 (×2): 40 mg via ORAL
  Filled 2020-02-16 (×2): qty 2

## 2020-02-16 MED ORDER — AMLODIPINE BESYLATE 10 MG PO TABS
10.0000 mg | ORAL_TABLET | Freq: Every day | ORAL | Status: DC
  Administered 2020-02-17 – 2020-02-18 (×2): 10 mg via ORAL
  Filled 2020-02-16 (×2): qty 1

## 2020-02-16 MED ORDER — SODIUM CHLORIDE 0.9% FLUSH
3.0000 mL | Freq: Two times a day (BID) | INTRAVENOUS | Status: DC
  Administered 2020-02-17 – 2020-02-18 (×3): 3 mL via INTRAVENOUS

## 2020-02-16 MED ORDER — CHLORHEXIDINE GLUCONATE CLOTH 2 % EX PADS
6.0000 | MEDICATED_PAD | Freq: Every day | CUTANEOUS | Status: DC
  Administered 2020-02-16 – 2020-02-17 (×2): 6 via TOPICAL

## 2020-02-16 MED ORDER — SODIUM CHLORIDE 0.9 % IV BOLUS: 1000.0000 mL | Freq: Once | INTRAVENOUS | Status: DC

## 2020-02-16 MED ORDER — ENOXAPARIN SODIUM 40 MG/0.4ML ~~LOC~~ SOLN
40.0000 mg | SUBCUTANEOUS | Status: DC
  Administered 2020-02-17 – 2020-02-18 (×2): 40 mg via SUBCUTANEOUS
  Filled 2020-02-16 (×2): qty 0.4

## 2020-02-16 MED ORDER — NICARDIPINE HCL IN NACL 20-0.86 MG/200ML-% IV SOLN
3.0000 mg/h | INTRAVENOUS | Status: DC
  Administered 2020-02-16: 12.5 mg/h via INTRAVENOUS
  Administered 2020-02-16: 5 mg/h via INTRAVENOUS
  Administered 2020-02-17: 7.5 mg/h via INTRAVENOUS
  Administered 2020-02-17: 5 mg/h via INTRAVENOUS
  Administered 2020-02-17: 10 mg/h via INTRAVENOUS
  Filled 2020-02-16 (×7): qty 200

## 2020-02-16 MED ORDER — METOPROLOL TARTRATE 5 MG/5ML IV SOLN
5.0000 mg | INTRAVENOUS | Status: DC | PRN
  Administered 2020-02-16 – 2020-02-18 (×5): 5 mg via INTRAVENOUS
  Filled 2020-02-16 (×6): qty 5

## 2020-02-16 MED ORDER — INSULIN ASPART 100 UNIT/ML ~~LOC~~ SOLN
0.0000 [IU] | Freq: Every day | SUBCUTANEOUS | Status: DC
  Administered 2020-02-16: 4 [IU] via SUBCUTANEOUS

## 2020-02-16 MED ORDER — METRONIDAZOLE 500 MG PO TABS
500.0000 mg | ORAL_TABLET | Freq: Two times a day (BID) | ORAL | Status: DC
  Administered 2020-02-16 – 2020-02-18 (×5): 500 mg via ORAL
  Filled 2020-02-16 (×6): qty 1

## 2020-02-16 MED ORDER — ONDANSETRON HCL 4 MG/2ML IJ SOLN
4.0000 mg | Freq: Once | INTRAMUSCULAR | Status: AC | PRN
  Administered 2020-02-16: 4 mg via INTRAVENOUS
  Filled 2020-02-16: qty 2

## 2020-02-16 MED ORDER — ONDANSETRON HCL 4 MG PO TABS: 4.0000 mg | ORAL_TABLET | Freq: Four times a day (QID) | ORAL | Status: DC | PRN

## 2020-02-16 MED ORDER — INSULIN ASPART 100 UNIT/ML ~~LOC~~ SOLN
0.0000 [IU] | Freq: Three times a day (TID) | SUBCUTANEOUS | Status: DC
  Administered 2020-02-17 (×2): 3 [IU] via SUBCUTANEOUS
  Administered 2020-02-17: 5 [IU] via SUBCUTANEOUS
  Administered 2020-02-18: 2 [IU] via SUBCUTANEOUS
  Administered 2020-02-18: 3 [IU] via SUBCUTANEOUS

## 2020-02-16 MED ORDER — ONDANSETRON HCL 4 MG/2ML IJ SOLN: 4.0000 mg | Freq: Four times a day (QID) | INTRAMUSCULAR | Status: DC | PRN

## 2020-02-16 MED ORDER — LISINOPRIL 10 MG PO TABS
20.0000 mg | ORAL_TABLET | Freq: Every day | ORAL | Status: DC
  Administered 2020-02-17: 20 mg via ORAL
  Filled 2020-02-16: qty 2

## 2020-02-16 MED ORDER — ACETAMINOPHEN 325 MG PO TABS
650.0000 mg | ORAL_TABLET | Freq: Four times a day (QID) | ORAL | Status: DC | PRN
  Administered 2020-02-17: 650 mg via ORAL
  Filled 2020-02-16: qty 2

## 2020-02-16 MED ORDER — HYDRALAZINE HCL 25 MG PO TABS
50.0000 mg | ORAL_TABLET | Freq: Once | ORAL | Status: AC
  Administered 2020-02-16: 50 mg via ORAL
  Filled 2020-02-16: qty 2

## 2020-02-16 NOTE — Plan of Care (Signed)
  Problem: Clinical Measurements: Goal: Cardiovascular complication will be avoided Outcome: Not Progressing   Problem: Coping: Goal: Level of anxiety will decrease Outcome: Not Progressing

## 2020-02-16 NOTE — Progress Notes (Signed)
Manual BP per [facility]  262/102.

## 2020-02-16 NOTE — Progress Notes (Signed)
Pt placed on monitor, vitals obtained and EKG.PA at bedside, initial BP 249/93 repeat taken 259/103. Per PA request manual BP to be taken. RT will attempt after IV placed and nausea medicine administered. PA aware. RT will continue to monitor.

## 2020-02-16 NOTE — ED Triage Notes (Signed)
N/V since this am.  No fevers at home.  Pt states she hasnt taken her diabetes medication in two week.

## 2020-02-16 NOTE — ED Notes (Signed)
Patient transported to CT 

## 2020-02-16 NOTE — Plan of Care (Signed)
Called from med center [facility] by [place] for patient who presented with intractable nausea vomiting and elevated blood pressure.  Patient is diabetic and she has been without her dulaglutide for about a week and recently ended up having a stressful event and break-up with her boyfriend.  She has been without her blood pressure medications and diabetes medications and came in with intractable nausea and vomiting and an elevated blood pressure.  She has been treated with multiple doses of hydralazine and metoprolol without avail.  She woke up nauseous and has had nausea and vomiting which has been intractable to multiple doses of antiemetics.  She will be given 1 L normal saline and started on low-dose IV fluids.  Covid is negative.  Office was initially elevated.  Of note she is found to have incidental 2.6 cm renal mass concerning for renal cell carcinoma.  She will need further work-up for this but her main issues are intractable nausea vomiting as well as hypertensive urgency.  EDP has been to start her on a drip and we have accepted her to the stepdown bed once it is available.

## 2020-02-16 NOTE — H&P (Signed)
History and Physical    Daysie Helf IRW:[id number] DOB: Jul 27, 1970 DOA: 02/16/2020  PCP: [name], NP  Patient coming from: [facility]  I have personally briefly reviewed patient's old medical records in [place]  Chief Complaint: Nausea and vomiting  HPI: Audelia Knape is a 49 y.o. female with medical history significant for type 2 diabetes, hypertension, hyperlipidemia, obesity who presented to [place] ED for evaluation of nausea and vomiting.  Patient states she has been out of her home dulaglutide for about 2 weeks and her antihypertensives for about 1 week.  Today (02/16/2020) she developed new onset of nausea and vomiting.  She had associated diaphoresis but denied any headache, change in vision, chest pain, palpitations, abdominal pain, dysuria, or lower extremity swelling.  She presented to the [place] ED for further evaluation.  [facility] ED Course:  Initial vitals showed BP 259/103, pulse 69, RR 20, temp 98.1 F, SPO2 100% on room air.  Labs show sodium 134, potassium 3.6, bicarb 25, BUN 12, creatinine 1.07, serum glucose 269, LFTs within normal limits, WBC 9.0, hemoglobin 12.6, platelets 302,000, lipase 99, beta hydroxy butyric acid 0.23.  VBG showed pH 7.433, PCO2 41.0, PO2 33.0. Urinalysis showed negative nitrites, moderate leukocytes, >500 glucose, negative ketones, 30 protein, 0-5 RBC/hpf, 0-5 WBC/hpf, many bacteria microscopy, trichomonas present. Urine pregnancy test is negative.  SARS-CoV-2 PCR is negative. Influenza A/B PCR's are negative.  CT abdomen/pelvis with contrast showed a 2.6 cm heterogeneously enhancing soft tissue mass in the lower pole of the right kidney which is highly suspicious for renal cell carcinoma per radiology read. Right renal vein is patent. There was no evidence for metastatic disease on imaging. No other acute intra-abdominal or pelvic findings were seen. A 3. 2 cm  benign-appearing cyst in the left ovary also was seen.  She was given 1 L normal saline bolus on ED arrival. Patient was given IV metoprolol 5 mg x 2, oral hydralazine with persistent hypertension and subsequently placed on nicardipine drip. She was started on oral metronidazole. The hospitalist service was consulted to admit for further evaluation management.  Review of Systems: All systems reviewed and are negative except as documented in history of present illness above.   Past Medical History:  Diagnosis Date  . Diabetes mellitus without complication ([place])   . Hypertension     Past Surgical History:  Procedure Laterality Date  . TUBAL LIGATION    . VOCAL CORD LATERALIZATION, ENDOSCOPIC APPROACH W/ MLB      Social History:  reports that she has been smoking cigarettes. She has been smoking about 0.50 packs per day. She has never used smokeless tobacco. She reports current alcohol use. She reports current drug use. Drug: Marijuana.  No Known Allergies  Family History  Problem Relation Age of Onset  . Hypertension Mother   . Diabetes Mother   . Kidney disease Mother   . Diabetes Father   . Hypertension Father   . Hypertension Maternal Grandmother   . Diabetes Maternal Grandmother   . Breast cancer Maternal Grandmother      Prior to Admission medications   Medication Sig Start Date End Date Taking? Authorizing Provider  amLODipine (NORVASC) 10 MG tablet Take 1 tablet (10 mg total) by mouth daily. 01/15/20   [name], NP  Dulaglutide (TRULICITY) 7.61 PJ/0.9TO SOPN Inject 0.75 mg into the skin once a week. 01/31/20   Charlott Rakes, MD  glucose  blood (RELION CONFIRM/MICRO TEST) test strip Use as instructed 12/11/18   Asencion Noble, MD  hydrALAZINE (APRESOLINE) 50 MG tablet Take 1 tablet (50 mg total) by mouth 3 (three) times daily. 01/15/20   [name], NP  lisinopril (ZESTRIL) 20 MG tablet Take 1 tablet (20 mg total) by mouth daily. 01/15/20   [name], NP  oxyCODONE-acetaminophen (PERCOCET/ROXICET) 5-325 MG tablet Take 1 tablet by mouth every 6 (six) hours as needed for severe pain. 01/03/20   Alroy Bailiff, [place], PA-C  pravastatin (PRAVACHOL) 40 MG tablet Take 1 tablet (40 mg total) by mouth daily. 01/17/20   [name], NP  ReliOn Lancet Devices 30G MISC 1 each by Does not apply route 3 (three) times daily. 12/11/18   Asencion Noble, MD    Physical Exam: Vitals:   02/16/20 2145 02/16/20 2200 02/16/20 2215 02/16/20 2230  BP: (!) 185/79 (!) 193/70 (!) 166/58 (!) 173/72  Pulse: 94 90 80 86  Resp: (!) 22 (!) 25 (!) 30 (!) 33  Temp:      TempSrc:      SpO2: 100% 100% 98% 100%  Weight:      Height:       Constitutional: Resting in bed in the right lateral decubitus position, NAD, calm, comfortable Eyes: PERRL, lids and conjunctivae normal ENMT: Mucous membranes are moist. Posterior pharynx clear of any exudate or lesions.Normal dentition.  Neck: normal, supple, no masses. Respiratory: clear to auscultation bilaterally, no wheezing, no crackles. Normal respiratory effort. No accessory muscle use.  Cardiovascular: Regular rate and rhythm, 2/6 systolic murmur present (patient states she has a known murmur since she was born).  No extremity edema. 2+ pedal pulses. Abdomen: no tenderness, no masses palpated. No hepatosplenomegaly. Bowel sounds positive.  Musculoskeletal: no clubbing / cyanosis. No joint deformity upper and lower extremities. Good ROM, no contractures. Normal muscle tone.  Skin: no rashes, lesions, ulcers. No induration Neurologic: CN 2-12 grossly intact. Sensation intact, Strength 5/5 in all 4.  Psychiatric: Normal judgment and insight. Alert and oriented x 3. Normal mood.   Labs on Admission: I have personally reviewed following labs and imaging studies  CBC: Recent Labs  Lab 02/16/20 1031 02/16/20 1043  WBC 9.0  --   HGB 12.6 13.9  HCT 38.3 41.0  MCV 84.9  --   PLT 302  --    Basic Metabolic  Panel: Recent Labs  Lab 02/16/20 1031 02/16/20 1043  NA 134* 136  K 3.6 3.9  CL 98  --   CO2 25  --   GLUCOSE 269*  --   BUN 12  --   CREATININE 1.07*  --   CALCIUM 9.1  --    GFR: Estimated Creatinine Clearance: 67.5 mL/min (A) (by C-G formula based on SCr of 1.07 mg/dL (H)). Liver Function Tests: Recent Labs  Lab 02/16/20 1031  AST 17  ALT 12  ALKPHOS 76  BILITOT 0.4  PROT 7.8  ALBUMIN 4.1   Recent Labs  Lab 02/16/20 1031  LIPASE 99*   No results for input(s): AMMONIA in the last 168 hours. Coagulation Profile: No results for input(s): INR, PROTIME in the last 168 hours. Cardiac Enzymes: No results for input(s): CKTOTAL, CKMB, CKMBINDEX, TROPONINI in the last 168 hours. BNP (last 3 results) No results for input(s): PROBNP in the last 8760 hours. HbA1C: No results for input(s): HGBA1C in the last 72 hours. CBG: Recent Labs  Lab 02/16/20 1039 02/16/20 1712 02/16/20 2151  GLUCAP  250* 291* 312*   Lipid Profile: No results for input(s): CHOL, HDL, LDLCALC, TRIG, CHOLHDL, LDLDIRECT in the last 72 hours. Thyroid Function Tests: No results for input(s): TSH, T4TOTAL, FREET4, T3FREE, THYROIDAB in the last 72 hours. Anemia Panel: No results for input(s): VITAMINB12, FOLATE, FERRITIN, TIBC, IRON, RETICCTPCT in the last 72 hours. Urine analysis:    Component Value Date/Time   COLORURINE STRAW (A) 02/16/2020 1052   APPEARANCEUR CLEAR 02/16/2020 1052   LABSPEC 1.020 02/16/2020 1052   PHURINE 7.5 02/16/2020 1052   GLUCOSEU >=500 (A) 02/16/2020 1052   HGBUR NEGATIVE 02/16/2020 1052   BILIRUBINUR NEGATIVE 02/16/2020 1052   KETONESUR NEGATIVE 02/16/2020 1052   PROTEINUR 30 (A) 02/16/2020 1052   UROBILINOGEN 0.2 10/10/2009 1544   NITRITE NEGATIVE 02/16/2020 1052   LEUKOCYTESUR MODERATE (A) 02/16/2020 1052    Radiological Exams on Admission: CT Abdomen Pelvis W Contrast  Result Date: 02/16/2020 CLINICAL DATA:  Nausea vomiting. EXAM: CT ABDOMEN AND PELVIS WITH  CONTRAST TECHNIQUE: Multidetector CT imaging of the abdomen and pelvis was performed using the standard protocol following bolus administration of intravenous contrast. CONTRAST:  171mL OMNIPAQUE IOHEXOL 300 MG/ML  SOLN COMPARISON:  None. FINDINGS: Lower chest: Unremarkable. Hepatobiliary: No suspicious focal abnormality within the liver parenchyma. There is no evidence for gallstones, gallbladder wall thickening, or pericholecystic fluid. No intrahepatic or extrahepatic biliary dilation. Pancreas: No focal mass lesion. No dilatation of the main duct. No intraparenchymal cyst. No peripancreatic edema. Spleen: No splenomegaly. No focal mass lesion. Adrenals/Urinary Tract: No adrenal nodule or mass. 2.4 x 1.7 x 2.6 cm heterogeneously enhancing soft tissue mass is identified in the lower pole right kidney. Left kidney unremarkable. No evidence for hydroureter. The urinary bladder appears normal for the degree of distention. Stomach/Bowel: Stomach is unremarkable. No gastric wall thickening. No evidence of outlet obstruction. Duodenum is normally positioned as is the ligament of Treitz. No small bowel wall thickening. No small bowel dilatation. The terminal ileum is normal. The appendix is normal. Colon is diffusely decompressed without appreciable wall thickening or pericolonic edema/inflammation. Vascular/Lymphatic: There is abdominal aortic atherosclerosis without aneurysm. There is no gastrohepatic or hepatoduodenal ligament lymphadenopathy. No retroperitoneal or mesenteric lymphadenopathy. No pelvic sidewall lymphadenopathy. Reproductive: Uterus unremarkable. 3.2 Cm benign appearing cyst noted in the left ovary. Right ovary unremarkable. Other: No intraperitoneal free fluid. Musculoskeletal: No worrisome lytic or sclerotic osseous abnormality. IMPRESSION: 1. 2.6 cm heterogeneously enhancing soft tissue mass in the lower pole right kidney, highly suspicious for renal cell carcinoma. Right renal vein is patent. No  evidence for metastatic disease. 2. No findings to explain the history of nausea and vomiting. 3. 3.2 cm benign appearing cyst in left ovary. Given patient age and lesion size, follow-up ultrasound recommended to further evaluate. This recommendation follows ACR consensus guidelines: White Paper of the ACR Incidental Findings Committee II on Adnexal Findings. J Am Coll Radiol 2013:10:675-681. 4.  Aortic Atherosclerois (ICD10-170.0) Findings were called by me at the time of interpretation on 02/16/2020 at 2:08 pm to provider [facility] , who verbally acknowledged these results. Electronically Signed   By: Misty Stanley M.D.   On: 02/16/2020 14:08    EKG: Personally reviewed. Sinus rhythm without acute ischemic changes. Not significantly changed when compared to previous.  Assessment/Plan Principal Problem:   Hypertensive urgency Active Problems:   Diabetes ([place])   Hyperlipidemia associated with type 2 diabetes mellitus ([place])   Right kidney mass   Left ovarian cyst  Jourdan Maldonado is a 49 y.o. female with medical  history significant for type 2 diabetes, hypertension, hyperlipidemia, obesity who is admitted with hypertensive urgency.  Hypertensive urgency: SBP up to 252 in the ED.  She was started on nicardipine drip with improving blood pressure, most recent 174/75.  We will start titrating off nicardipine and resume her home antihypertensives. -Titrate off nicardipine drip -Resume home hydralazine 50 mg TID, lisinopril 20 mg daily, amlodipine 10 mg daily -IV metoprolol as needed  Nausea/vomiting: Likely due to hypertensive urgency.  Symptoms not improving.  Continue antiemetic as needed.  Type 2 diabetes: A1c 7.9% on 01/15/2020.  Patient states she has been out of her home dulaglutide for about 2 weeks.  Hyperglycemic with CBG >300 on arrival.  No evidence of DKA on available labs. -Place on moderate SSI w/ HS coverage and adjust as needed  Trichomoniasis: Present on urinalysis.   Patient started on metronidazole 500 mg twice daily for 7 days.  Hyperlipidemia: Continue pravastatin.  Right kidney mass: 2.6 cm heterogeneously enhancing soft tissue mass in the lower pole of the right kidney was seen on CT A/P with contrast which is highly suspicious for renal cell carcinoma.  Discussed findings with patient and she is aware.  Will need inpatient versus outpatient urology consultation.  Left ovarian cyst: 3.2 cm benign-appearing cyst in the left ovary seen on CT A/P. Follow-up ultrasound recommended given patient's age and lesion size.  DVT prophylaxis: Lovenox Code Status: Full code, confirmed with patient Family Communication: Discussed with patient, she does not want [place] to discuss findings with family at this time.  She prefers to speak with them herself. Disposition Plan: From home and likely discharge to home pending adequate BP control Consults called: None Admission status:  Status is: Inpatient  Remains inpatient appropriate because:IV treatments appropriate due to intensity of illness or inability to take PO   Dispo: The patient is from: Home              Anticipated d/c is to: Home              Anticipated d/c date is: 2 days              Patient currently is not medically stable to d/c.   Zada Finders MD [facility]  If 7PM-7AM, please contact night-coverage [email or web address]  02/16/2020, 10:52 PM

## 2020-02-16 NOTE — ED Provider Notes (Addendum)
[facility] EMERGENCY DEPARTMENT Provider Note   CSN: [id number] Arrival date & time: 02/16/20  7425     History Chief Complaint  Patient presents with  . Nausea    Hannah Dougherty is a 49 y.o. female who presents with concern for nausea and vomiting since waking up this morning.  Patient is a diabetic, with been without her dulaglutide for 1 week, as she recently broke up with her boyfriend and left her medications at his house, and she was "afraid to go back to get it". She states she does not feel safe around him, feels he is dangerous.   She states that she has been without her hypertension and diabetes medications for 1 week.  She woke up yesterday morning feeling quite nauseous, however was "able to shake it".  She states she woke up this morning with severe nausea and has had 2 episodes of nonbloody nonbilious emesis. She denies chest pain, shortness of breath, palpitations, abdominal pain, diarrhea, headache, dizziness, blurry vision, double vision.  At time of my initial evaluation the patient she is breathing fairly quickly; when questioned about this she states that she is not having trouble breathing, however it improves her nausea when she breathes quickly.  I personally reviewed this patient's medical records.  She has history of hypertension and diabetes, as well as hyperlipidemia.  She has not had any of her antihypertensive or diabetic medications today.  HPI     Past Medical History:  Diagnosis Date  . Diabetes mellitus without complication ([place])   . Hypertension     Patient Active Problem List   Diagnosis Date Noted  . Hypertensive urgency 02/16/2020  . Hypertension 12/28/2018  . Healthcare maintenance 12/14/2018  . Hyponatremia 12/10/2018  . Diabetes ([place]) 12/09/2018    Past Surgical History:  Procedure Laterality Date  . TUBAL LIGATION    . VOCAL CORD LATERALIZATION, ENDOSCOPIC APPROACH W/ MLB       OB History   No obstetric history on  file.     Family History  Problem Relation Age of Onset  . Hypertension Mother   . Diabetes Mother   . Kidney disease Mother   . Diabetes Father   . Hypertension Father   . Hypertension Maternal Grandmother   . Diabetes Maternal Grandmother   . Breast cancer Maternal Grandmother     Social History   Tobacco Use  . Smoking status: Current Some Day Smoker    Packs/day: 0.50    Types: Cigarettes  . Smokeless tobacco: Never Used  Substance Use Topics  . Alcohol use: Yes  . Drug use: Yes    Types: Marijuana    Home Medications Prior to Admission medications   Medication Sig Start Date End Date Taking? Authorizing Provider  amLODipine (NORVASC) 10 MG tablet Take 1 tablet (10 mg total) by mouth daily. 01/15/20   [name], NP  Dulaglutide (TRULICITY) 9.56 LO/7.5IE SOPN Inject 0.75 mg into the skin once a week. 01/31/20   Charlott Rakes, MD  glucose blood (RELION CONFIRM/MICRO TEST) test strip Use as instructed 12/11/18   Asencion Noble, MD  hydrALAZINE (APRESOLINE) 50 MG tablet Take 1 tablet (50 mg total) by mouth 3 (three) times daily. 01/15/20   [name], NP  lisinopril (ZESTRIL) 20 MG tablet Take 1 tablet (20 mg total) by mouth daily. 01/15/20   [name], NP  oxyCODONE-acetaminophen (PERCOCET/ROXICET) 5-325 MG tablet Take 1 tablet by mouth every 6 (six) hours as needed for severe  pain. 01/03/20   Eustaquio Maize, PA-C  pravastatin (PRAVACHOL) 40 MG tablet Take 1 tablet (40 mg total) by mouth daily. 01/17/20   [name], NP  ReliOn Lancet Devices 30G MISC 1 each by Does not apply route 3 (three) times daily. 12/11/18   Asencion Noble, MD    Allergies    Patient has no known allergies.  Review of Systems   Review of Systems  Constitutional: Positive for appetite change, diaphoresis and fatigue. Negative for activity change, chills and fever.  HENT: Negative.   Eyes: Negative.  Negative for photophobia and visual disturbance.   Respiratory: Negative for cough, chest tightness, shortness of breath and wheezing.   Cardiovascular: Negative for chest pain, palpitations and leg swelling.  Gastrointestinal: Positive for nausea and vomiting. Negative for abdominal distention, abdominal pain, blood in stool, constipation and diarrhea.  Genitourinary: Negative for dysuria, frequency and urgency.  Musculoskeletal: Negative.   Skin: Negative.   Allergic/Immunologic: Positive for immunocompromised state.       DMT2  Neurological: Negative for dizziness, syncope, facial asymmetry, weakness, light-headedness and headaches.  Hematological: Negative.     Physical Exam Updated Vital Signs BP (!) 174/74 (BP Location: Right Arm)   Pulse 82   Temp 98.5 F (36.9 C) (Oral)   Resp 18   Ht 5\' 5"  (1.651 m)   Wt 86.2 kg   LMP 10/28/2016 (Within Weeks)   SpO2 99%   BMI 31.62 kg/m   Physical Exam Vitals and nursing note reviewed.  Constitutional:      Appearance: She is obese. She is ill-appearing and diaphoretic.  HENT:     Head: Normocephalic and atraumatic.     Nose: Nose normal.     Mouth/Throat:     Mouth: Mucous membranes are moist.     Pharynx: Oropharynx is clear. Uvula midline. No oropharyngeal exudate or posterior oropharyngeal erythema.  Eyes:     General:        Right eye: No discharge.        Left eye: No discharge.     Extraocular Movements: Extraocular movements intact.     Conjunctiva/sclera: Conjunctivae normal.     Pupils: Pupils are equal, round, and reactive to light.  Neck:     Trachea: Trachea and phonation normal.  Cardiovascular:     Rate and Rhythm: Normal rate and regular rhythm.     Pulses: Normal pulses.          Radial pulses are 2+ on the right side and 2+ on the left side.       Dorsalis pedis pulses are 2+ on the right side and 2+ on the left side.     Heart sounds: Normal heart sounds. No murmur heard.   Pulmonary:     Effort: Pulmonary effort is normal. No respiratory distress.      Breath sounds: Normal breath sounds. No wheezing or rales.  Chest:     Chest wall: No deformity, swelling, tenderness, crepitus or edema.  Abdominal:     General: Bowel sounds are normal. There is no distension.     Palpations: Abdomen is soft.     Tenderness: There is no abdominal tenderness. There is no guarding or rebound.    Musculoskeletal:        General: No deformity or signs of injury.     Cervical back: Normal range of motion and neck supple. No rigidity.     Right lower leg: No edema.  Lymphadenopathy:  Cervical: No cervical adenopathy.  Skin:    General: Skin is warm.     Capillary Refill: Capillary refill takes less than 2 seconds.  Neurological:     General: No focal deficit present.     Mental Status: She is alert and oriented to person, place, and time.  Psychiatric:        Mood and Affect: Mood normal.     ED Results / Procedures / Treatments   Labs (all labs ordered are listed, but only abnormal results are displayed) Labs Reviewed  LIPASE, BLOOD - Abnormal; Notable for the following components:      Result Value   Lipase 99 (*)    All other components within normal limits  COMPREHENSIVE METABOLIC PANEL - Abnormal; Notable for the following components:   Sodium 134 (*)    Glucose, Bld 269 (*)    Creatinine, Ser 1.07 (*)    All other components within normal limits  URINALYSIS, ROUTINE W REFLEX MICROSCOPIC - Abnormal; Notable for the following components:   Color, Urine STRAW (*)    Glucose, UA >=500 (*)    Protein, ur 30 (*)    Leukocytes,Ua MODERATE (*)    All other components within normal limits  URINALYSIS, MICROSCOPIC (REFLEX) - Abnormal; Notable for the following components:   Bacteria, UA MANY (*)    Trichomonas, UA PRESENT (*)    All other components within normal limits  CBG MONITORING, ED - Abnormal; Notable for the following components:   Glucose-Capillary 250 (*)    All other components within normal limits  I-STAT VENOUS BLOOD  GAS, ED - Abnormal; Notable for the following components:   pH, Ven 7.433 (*)    pCO2, Ven 41.0 (*)    Acid-Base Excess 3.0 (*)    All other components within normal limits  CBG MONITORING, ED - Abnormal; Notable for the following components:   Glucose-Capillary 291 (*)    All other components within normal limits  RESP PANEL BY RT-PCR (FLU A&B, COVID) ARPGX2  CBC  PREGNANCY, URINE  BETA-HYDROXYBUTYRIC ACID    EKG EKG Interpretation  Date/Time:  Friday February 16 2020 10:14:40 EST Ventricular Rate:  71 PR Interval:    QRS Duration: 88 QT Interval:  433 QTC Calculation: 471 R Axis:   45 Text Interpretation: Sinus rhythm Confirmed by Fredia Sorrow [phone]) on 02/16/2020 10:25:35 AM   Radiology CT Abdomen Pelvis W Contrast  Result Date: 02/16/2020 CLINICAL DATA:  Nausea vomiting. EXAM: CT ABDOMEN AND PELVIS WITH CONTRAST TECHNIQUE: Multidetector CT imaging of the abdomen and pelvis was performed using the standard protocol following bolus administration of intravenous contrast. CONTRAST:  17mL OMNIPAQUE IOHEXOL 300 MG/ML  SOLN COMPARISON:  None. FINDINGS: Lower chest: Unremarkable. Hepatobiliary: No suspicious focal abnormality within the liver parenchyma. There is no evidence for gallstones, gallbladder wall thickening, or pericholecystic fluid. No intrahepatic or extrahepatic biliary dilation. Pancreas: No focal mass lesion. No dilatation of the main duct. No intraparenchymal cyst. No peripancreatic edema. Spleen: No splenomegaly. No focal mass lesion. Adrenals/Urinary Tract: No adrenal nodule or mass. 2.4 x 1.7 x 2.6 cm heterogeneously enhancing soft tissue mass is identified in the lower pole right kidney. Left kidney unremarkable. No evidence for hydroureter. The urinary bladder appears normal for the degree of distention. Stomach/Bowel: Stomach is unremarkable. No gastric wall thickening. No evidence of outlet obstruction. Duodenum is normally positioned as is the ligament of  Treitz. No small bowel wall thickening. No small bowel dilatation. The terminal ileum is normal. The  appendix is normal. Colon is diffusely decompressed without appreciable wall thickening or pericolonic edema/inflammation. Vascular/Lymphatic: There is abdominal aortic atherosclerosis without aneurysm. There is no gastrohepatic or hepatoduodenal ligament lymphadenopathy. No retroperitoneal or mesenteric lymphadenopathy. No pelvic sidewall lymphadenopathy. Reproductive: Uterus unremarkable. 3.2 Cm benign appearing cyst noted in the left ovary. Right ovary unremarkable. Other: No intraperitoneal free fluid. Musculoskeletal: No worrisome lytic or sclerotic osseous abnormality. IMPRESSION: 1. 2.6 cm heterogeneously enhancing soft tissue mass in the lower pole right kidney, highly suspicious for renal cell carcinoma. Right renal vein is patent. No evidence for metastatic disease. 2. No findings to explain the history of nausea and vomiting. 3. 3.2 cm benign appearing cyst in left ovary. Given patient age and lesion size, follow-up ultrasound recommended to further evaluate. This recommendation follows ACR consensus guidelines: White Paper of the ACR Incidental Findings Committee II on Adnexal Findings. J Am Coll Radiol 2013:10:675-681. 4.  Aortic Atherosclerois (ICD10-170.0) Findings were called by me at the time of interpretation on 02/16/2020 at 2:08 pm to provider [facility] , who verbally acknowledged these results. Electronically Signed   By: Misty Stanley M.D.   On: 02/16/2020 14:08    Procedures .Critical Care Performed by: Emeline Darling, PA-C Authorized by: Emeline Darling, PA-C   Critical care provider statement:    Critical care time (minutes):  45   Critical care was necessary to treat or prevent imminent or life-threatening deterioration of the following conditions: Hypertensive urgency.   Critical care was time spent personally by me on the following activities:  Evaluation of  patient's response to treatment, examination of patient, ordering and review of laboratory studies, ordering and review of radiographic studies, re-evaluation of patient's condition and review of old charts   (including critical care time)  Medications Ordered in ED Medications  metoprolol tartrate (LOPRESSOR) injection 5 mg (5 mg Intravenous Not Given 02/16/20 1618)  metroNIDAZOLE (FLAGYL) tablet 500 mg (500 mg Oral Given 02/16/20 1618)  nicardipine (CARDENE) 20mg  in 0.86% saline 258ml IV infusion (0.1 mg/ml) (5 mg/hr Intravenous New Bag/Given 02/16/20 1657)  ondansetron (ZOFRAN) injection 4 mg (4 mg Intravenous Given 02/16/20 1043)  sodium chloride 0.9 % bolus 1,000 mL (0 mLs Intravenous Stopped 02/16/20 1619)  hydrALAZINE (APRESOLINE) tablet 50 mg (50 mg Oral Given 02/16/20 1312)  iohexol (OMNIPAQUE) 300 MG/ML solution 100 mL (100 mLs Intravenous Contrast Given 02/16/20 1317)  ondansetron (ZOFRAN) injection 4 mg (4 mg Intravenous Given 02/16/20 1617)    ED Course  I have reviewed the triage vital signs and the nursing notes.  Pertinent labs & imaging results that were available during my care of the patient were reviewed by me and considered in my medical decision making (see chart for details).  Clinical Course as of Feb 16 1956  Fri Feb 16, 2020  1051 Bicarbonate: 27.4 [RS]    Clinical Course User Index [RS] Laquasia Pincus, Sharlene Dory   MDM Rules/Calculators/A&P                         49 year old female with history of diabetes who presents with 2 days of nausea, now with vomiting, diaphoresis, weakness. Patient has not had dulaglutide in 1 week.  Differential diagnosis for this patient symptoms include but are not limited to DKA, HHS, hypertensive urgency, ACS, infectious etiologies such as COVID-19, gastroenteritis, UTI/pyelonephritis.    Patient hypertensive on intake to 199/130, however she was vomiting at that time.  At the time I presented to the bedside  patient was feeling  extremely nauseous, dry heaving, blood pressure is 249/93.  Vital signs are otherwise normal.  The patient is not tachypneic or tachycardic.  On physical exam patient is diaphoretic, clearly uncomfortable, ill-appearing.  Antiemetics, fluids ordered.  Laboratory studies ordered.  Will reassess blood pressure after nausea is better controlled.  EKG with sinus rhythm, no STEMI.  CBG 250.  VBG  Reassuring, pH 7.433, CO2 decreased to 41.  UA significant for large amount of glucose, protein, moderate leukocytes, many bacteria, and positive for trichomoniasis.  At the time of my reevaluation of the patient she is sleeping comfortably in her hospital bed, she is arousable, continues to deny abdominal pain, but reports improved nausea.  Blood pressure remains high despite nausea control, will proceed with antihypertensive medication at this time.  Respiratory pathogen panel negative.  CBC unremarkable, CMP with mild hyponatremia 134, creatinine elevated to 1.07, previously 0.92. Beta-hydroxybutyric acid normal 0.23.  Lipase elevated to 99.  We will proceed with CT abdomen pelvis.  Patient continues to be extremely hypertensive despite oral hydralazine and multiple doses of IV Lopressor.  She continues to have nausea and active vomiting despite multiple doses of antiemetic.  Will await CT results, however plan to admit the patient for hypertensive urgency.   CT results called this critical result to this provider by radiologist Dr. Tery Sanfilippo.  Finding of 2.6 cm mass on the lower pole of the right kidney, suspicious for renal cell carcinoma.  No evidence of metastatic disease. Additional findings 3.2 cm benign-appearing left ovarian cyst. Pancreas without focal mass or lesion, without dilatation of the main duct, or peripancreatic edema.  Etiology of patient's nausea/ vomiting remains unclear.  Consult placed to hospitalist Dr. Alfredia Ferguson, who was agreeable to admitting the patient to his service for  hypertensive urgency; plan to admit to stepdown bed at [facility].  Per hospitalist request, Cardene drip was ordered on this patient for control of blood pressure while in emergency department.  Recommend close urologic consultation while inpatient.  Will begin antibiotic therapy for trichomoniasis while the patient is in the emergency department, with oral Flagyl twice daily.  Extensive discussion took place with the patient regarding her CT scan findings, as well as disposition plan to admit the patient to the hospital.  She voiced understanding of all of her results and each of her questions were answered to her expressed satisfaction.  Patient has been assigned an inpatient bed at this time.   Patient requested that information regarding her likely new renal malignancy be kept confidential from her primary next of kin contact, which is her daughter, Almendra Loria.  Patient's blood pressure is improving significantly on Cardene drip, BP now 178/67.  Final Clinical Impression(s) / ED Diagnoses Final diagnoses:  None    Rx / DC Orders ED Discharge Orders    None       Emeline Darling, PA-C 02/16/20 [address], Gypsy Balsam, PA-C 02/16/20 Lazarus Salines, MD 02/20/20 1521

## 2020-02-16 NOTE — ED Notes (Signed)
Returns from radiology, IV infusion restarted, VS obtained, assessment performed

## 2020-02-17 DIAGNOSIS — I160 Hypertensive urgency: Principal | ICD-10-CM

## 2020-02-17 LAB — GLUCOSE, CAPILLARY
Glucose-Capillary: 212 mg/dL — ABNORMAL HIGH (ref 70–99)
Glucose-Capillary: 195 mg/dL — ABNORMAL HIGH (ref 70–99)
Glucose-Capillary: 181 mg/dL — ABNORMAL HIGH (ref 70–99)
Glucose-Capillary: 142 mg/dL — ABNORMAL HIGH (ref 70–99)

## 2020-02-17 LAB — BASIC METABOLIC PANEL
Anion gap: 12 (ref 5–15)
BUN: 17 mg/dL (ref 6–20)
CO2: 22 mmol/L (ref 22–32)
Calcium: 8.9 mg/dL (ref 8.9–10.3)
Chloride: 101 mmol/L (ref 98–111)
Creatinine, Ser: 0.87 mg/dL (ref 0.44–1.00)
GFR, Estimated: >60 mL/min (ref >60)
Glucose, Bld: 184 mg/dL — ABNORMAL HIGH (ref 70–99)
Potassium: 3 mmol/L — ABNORMAL LOW (ref 3.5–5.1)
Sodium: 135 mmol/L (ref 135–145)

## 2020-02-17 LAB — HIV ANTIBODY (ROUTINE TESTING W REFLEX): HIV Screen 4th Generation wRfx: NONREACTIVE

## 2020-02-17 MED ORDER — HYDROCHLOROTHIAZIDE 25 MG PO TABS
25.0000 mg | ORAL_TABLET | Freq: Every day | ORAL | Status: DC
  Administered 2020-02-17: 25 mg via ORAL
  Filled 2020-02-17: qty 1

## 2020-02-17 MED ORDER — LISINOPRIL 10 MG PO TABS: 40.0000 mg | ORAL_TABLET | Freq: Every day | ORAL | Status: DC

## 2020-02-17 MED ORDER — PROCHLORPERAZINE EDISYLATE 10 MG/2ML IJ SOLN
10.0000 mg | Freq: Four times a day (QID) | INTRAMUSCULAR | Status: DC | PRN
  Administered 2020-02-17 – 2020-02-18 (×2): 10 mg via INTRAVENOUS
  Filled 2020-02-17 (×2): qty 2

## 2020-02-17 MED ORDER — POTASSIUM CHLORIDE 10 MEQ/100ML IV SOLN
10.0000 meq | INTRAVENOUS | Status: AC
  Administered 2020-02-17 – 2020-02-18 (×5): 10 meq via INTRAVENOUS
  Filled 2020-02-17 (×5): qty 100

## 2020-02-17 MED ORDER — POTASSIUM CHLORIDE CRYS ER 20 MEQ PO TBCR
40.0000 meq | EXTENDED_RELEASE_TABLET | ORAL | Status: DC
  Administered 2020-02-17: 20 meq via ORAL
  Filled 2020-02-17: qty 2

## 2020-02-17 MED ORDER — LISINOPRIL 10 MG PO TABS
20.0000 mg | ORAL_TABLET | Freq: Once | ORAL | Status: AC
  Administered 2020-02-17: 20 mg via ORAL
  Filled 2020-02-17: qty 2

## 2020-02-17 NOTE — Treatment Plan (Signed)
Urology was consulted for incidentally found renal mass on CT imaging. Shows 2.6 cm heterogeneously enhancing soft tissue mass in the lower pole right kidney, highly suspicious for renal cell carcinoma. Right renal vein is patent. No evidence for metastatic disease.   No acute urologic intervention recommended at this time, however patient should be followed closely in urology outpatient clinic to discuss management and possible intervention for renal mass moving forward. Follow up will be requested but please ensure patient is aware that they may need to call the urologic clinic to ensure follow up appointment.   [facility]

## 2020-02-17 NOTE — Progress Notes (Signed)
PROGRESS NOTE    Hannah Dougherty  KGY:[id number] DOB: 18-Jul-1970 DOA: 02/16/2020 PCP: [name], NP   Chief Complain: Nausea and vomiting  Brief Narrative:  Patient is a 49 year old female with history of diabetes mellitus, hypertension, hyperlipidemia, obesity who presented to [place] for the evaluation of nausea and vomiting. She was not taking her antihypertensive meds and diabetic medications for a week. On presentation she was extremely hypertensive with blood pressure in the range of 250/100s. Patient was admitted for the management of hypertensive urgency.  Assessment & Plan:   Principal Problem:   Hypertensive urgency Active Problems:   Diabetes ([place])   Hyperlipidemia associated with type 2 diabetes mellitus ([place])   Right kidney mass   Left ovarian cyst   Trichomoniasis   Hypertensive urgency: Extremely hypertensive on presentation. Had to be started on nicardipine drip. Was not taking her meds for a week.  She is a still hypertensive today.  We have resume her home medications with some adjustment.  Hydrochlorothiazide positive added  Nausea/vomiting: Much improved.  Continue antiemetics as needed  Diabetes type 2: A1c of 7.9% as per 01/15/2020.  She was not taking home antidiabetic medication for about 2 weeks.  She was also hyperglycemic on arrival.  No evidence of DKA.  Continue current insulin regimen.  Right kidney mass: CT showed 2.6 cm heterogeneously enhancing soft tissue mass in the lower pole of the right kidney, highly suspicious for renal cell carcinoma.  Discussed with urology who recommended outpatient follow-up.  Left ovarian cyst: CT showed 3.2 cm benign-appearing cyst in the left ovary.  Follow-up with GYN as an outpatient  Hypokalemia: Being supplemented with potassium.  Trichomoniasis: Incidentally found on the urine.  Started on metronidazole  Obesity: BMI 31.6        DVT prophylaxis:Lovenox Code Status: Full Family  Communication: None at bedside Status is: Inpatient  Remains inpatient appropriate because:Inpatient level of care appropriate due to severity of illness   Dispo: The patient is from: Home              Anticipated d/c is to: Home              Anticipated d/c date is: 1 day              Patient currently is not medically stable to d/c.     Consultants: None  Procedures:  Antimicrobials:  Anti-infectives (From admission, onward)   Start     Dose/Rate Route Frequency Ordered Stop   02/16/20 1545  metroNIDAZOLE (FLAGYL) tablet 500 mg        500 mg Oral 2 times daily 02/16/20 1541 02/23/20 0959      Subjective: Patient seen and examined at the bedside this morning hemodynamically stable during my evaluation.  Denies any nausea and vomiting today.  Still hypertensive.  Objective: Vitals:   02/17/20 0330 02/17/20 0345 02/17/20 0400 02/17/20 0415  BP: (!) 175/65 (!) 157/76 127/70 (!) 143/72  Pulse: 93 97 94 89  Resp: (!) 25 (!) 25 (!) 24 (!) 25  Temp:      TempSrc:      SpO2: 100% 100% 92% 99%  Weight:      Height:        Intake/Output Summary (Last 24 hours) at 02/17/2020 0738 Last data filed at 02/17/2020 0400 Gross per 24 hour  Intake 813.71 ml  Output 300 ml  Net 513.71 ml   Filed Weights   02/16/20 0949  Weight: 86.2 kg  Examination:  General exam: Appears calm and comfortable ,Not in distress,average built HEENT:PERRL,Oral mucosa moist, Ear/Nose normal on gross exam Respiratory system: Bilateral equal air entry, normal vesicular breath sounds, no wheezes or crackles  Cardiovascular system: S1 & S2 heard, RRR. No JVD, murmurs, rubs, gallops or clicks. No pedal edema. Gastrointestinal system: Abdomen is nondistended, soft and nontender. No organomegaly or masses felt. Normal bowel sounds heard. Central nervous system: Alert and oriented. No focal neurological deficits. Extremities: No edema, no clubbing ,no cyanosis, distal peripheral pulses palpable. Skin:  No rashes, lesions or ulcers,no icterus ,no pallor   Data Reviewed: I have personally reviewed following labs and imaging studies  CBC: Recent Labs  Lab 02/16/20 1031 02/16/20 1043  WBC 9.0  --   HGB 12.6 13.9  HCT 38.3 41.0  MCV 84.9  --   PLT 302  --    Basic Metabolic Panel: Recent Labs  Lab 02/16/20 1031 02/16/20 1043  NA 134* 136  K 3.6 3.9  CL 98  --   CO2 25  --   GLUCOSE 269*  --   BUN 12  --   CREATININE 1.07*  --   CALCIUM 9.1  --    GFR: Estimated Creatinine Clearance: 67.5 mL/min (A) (by C-G formula based on SCr of 1.07 mg/dL (H)). Liver Function Tests: Recent Labs  Lab 02/16/20 1031  AST 17  ALT 12  ALKPHOS 76  BILITOT 0.4  PROT 7.8  ALBUMIN 4.1   Recent Labs  Lab 02/16/20 1031  LIPASE 99*   No results for input(s): AMMONIA in the last 168 hours. Coagulation Profile: No results for input(s): INR, PROTIME in the last 168 hours. Cardiac Enzymes: No results for input(s): CKTOTAL, CKMB, CKMBINDEX, TROPONINI in the last 168 hours. BNP (last 3 results) No results for input(s): PROBNP in the last 8760 hours. HbA1C: No results for input(s): HGBA1C in the last 72 hours. CBG: Recent Labs  Lab 02/16/20 1039 02/16/20 1712 02/16/20 2151  GLUCAP 250* 291* 312*   Lipid Profile: No results for input(s): CHOL, HDL, LDLCALC, TRIG, CHOLHDL, LDLDIRECT in the last 72 hours. Thyroid Function Tests: No results for input(s): TSH, T4TOTAL, FREET4, T3FREE, THYROIDAB in the last 72 hours. Anemia Panel: No results for input(s): VITAMINB12, FOLATE, FERRITIN, TIBC, IRON, RETICCTPCT in the last 72 hours. Sepsis Labs: No results for input(s): PROCALCITON, LATICACIDVEN in the last 168 hours.  Recent Results (from the past 240 hour(s))  Resp Panel by RT-PCR (Flu A&B, Covid) Nasopharyngeal Swab     Status: None   Collection Time: 02/16/20 11:17 AM   Specimen: Nasopharyngeal Swab; Nasopharyngeal(NP) swabs in vial transport medium  Result Value Ref Range Status     SARS Coronavirus 2 by RT PCR NEGATIVE NEGATIVE Final    Comment: (NOTE) SARS-CoV-2 target nucleic acids are NOT DETECTED.  The SARS-CoV-2 RNA is generally detectable in upper respiratory specimens during the acute phase of infection. The lowest concentration of SARS-CoV-2 viral copies this assay can detect is 138 copies/mL. A negative result does not preclude SARS-Cov-2 infection and should not be used as the sole basis for treatment or other patient management decisions. A negative result may occur with  improper specimen collection/handling, submission of specimen other than nasopharyngeal swab, presence of viral mutation(s) within the areas targeted by this assay, and inadequate number of viral copies(<138 copies/mL). A negative result must be combined with clinical observations, patient history, and epidemiological information. The expected result is Negative.  Fact Sheet for Patients:  EntrepreneurPulse.com.au  Fact Sheet for Healthcare Providers:  IncredibleEmployment.be  This test is no t yet approved or cleared by the [place] FDA and  has been authorized for detection and/or diagnosis of SARS-CoV-2 by FDA under an Emergency Use Authorization (EUA). This EUA will remain  in effect (meaning this test can be used) for the duration of the COVID-19 declaration under Section 564(b)(1) of the Act, 21 U.S.C.section 360bbb-3(b)(1), unless the authorization is terminated  or revoked sooner.       Influenza A by PCR NEGATIVE NEGATIVE Final   Influenza B by PCR NEGATIVE NEGATIVE Final    Comment: (NOTE) The Xpert Xpress SARS-CoV-2/FLU/RSV plus assay is intended as an aid in the diagnosis of influenza from Nasopharyngeal swab specimens and should not be used as a sole basis for treatment. Nasal washings and aspirates are unacceptable for Xpert Xpress SARS-CoV-2/FLU/RSV testing.  Fact Sheet for  Patients: EntrepreneurPulse.com.au  Fact Sheet for Healthcare Providers: IncredibleEmployment.be  This test is not yet approved or cleared by the [place] FDA and has been authorized for detection and/or diagnosis of SARS-CoV-2 by FDA under an Emergency Use Authorization (EUA). This EUA will remain in effect (meaning this test can be used) for the duration of the COVID-19 declaration under Section 564(b)(1) of the Act, 21 U.S.C. section 360bbb-3(b)(1), unless the authorization is terminated or revoked.  Performed at [facility], [address] [place], [place] [address]   MRSA PCR Screening     Status: None   Collection Time: 02/16/20  9:59 PM   Specimen: Nasopharyngeal  Result Value Ref Range Status   MRSA by PCR NEGATIVE NEGATIVE Final    Comment:        The GeneXpert MRSA Assay (FDA approved for NASAL specimens only), is one component of a comprehensive MRSA colonization surveillance program. It is not intended to diagnose MRSA infection nor to guide or monitor treatment for MRSA infections. Performed at [facility], [place] [address] [place], [place] [address]          Radiology Studies: CT Abdomen Pelvis W Contrast  Result Date: 02/16/2020 CLINICAL DATA:  Nausea vomiting. EXAM: CT ABDOMEN AND PELVIS WITH CONTRAST TECHNIQUE: Multidetector CT imaging of the abdomen and pelvis was performed using the standard protocol following bolus administration of intravenous contrast. CONTRAST:  123mL OMNIPAQUE IOHEXOL 300 MG/ML  SOLN COMPARISON:  None. FINDINGS: Lower chest: Unremarkable. Hepatobiliary: No suspicious focal abnormality within the liver parenchyma. There is no evidence for gallstones, gallbladder wall thickening, or pericholecystic fluid. No intrahepatic or extrahepatic biliary dilation. Pancreas: No focal mass lesion. No dilatation of the main duct. No intraparenchymal cyst. No peripancreatic  edema. Spleen: No splenomegaly. No focal mass lesion. Adrenals/Urinary Tract: No adrenal nodule or mass. 2.4 x 1.7 x 2.6 cm heterogeneously enhancing soft tissue mass is identified in the lower pole right kidney. Left kidney unremarkable. No evidence for hydroureter. The urinary bladder appears normal for the degree of distention. Stomach/Bowel: Stomach is unremarkable. No gastric wall thickening. No evidence of outlet obstruction. Duodenum is normally positioned as is the ligament of Treitz. No small bowel wall thickening. No small bowel dilatation. The terminal ileum is normal. The appendix is normal. Colon is diffusely decompressed without appreciable wall thickening or pericolonic edema/inflammation. Vascular/Lymphatic: There is abdominal aortic atherosclerosis without aneurysm. There is no gastrohepatic or hepatoduodenal ligament lymphadenopathy. No retroperitoneal or mesenteric lymphadenopathy. No pelvic sidewall lymphadenopathy. Reproductive: Uterus unremarkable. 3.2 Cm benign appearing cyst noted in the left ovary. Right ovary unremarkable.  Other: No intraperitoneal free fluid. Musculoskeletal: No worrisome lytic or sclerotic osseous abnormality. IMPRESSION: 1. 2.6 cm heterogeneously enhancing soft tissue mass in the lower pole right kidney, highly suspicious for renal cell carcinoma. Right renal vein is patent. No evidence for metastatic disease. 2. No findings to explain the history of nausea and vomiting. 3. 3.2 cm benign appearing cyst in left ovary. Given patient age and lesion size, follow-up ultrasound recommended to further evaluate. This recommendation follows ACR consensus guidelines: White Paper of the ACR Incidental Findings Committee II on Adnexal Findings. J Am Coll Radiol 2013:10:675-681. 4.  Aortic Atherosclerois (ICD10-170.0) Findings were called by me at the time of interpretation on 02/16/2020 at 2:08 pm to provider [facility] , who verbally acknowledged these results.  Electronically Signed   By: Misty Stanley M.D.   On: 02/16/2020 14:08        Scheduled Meds: . amLODipine  10 mg Oral Daily  . Chlorhexidine Gluconate Cloth  6 each Topical Daily  . enoxaparin (LOVENOX) injection  40 mg Subcutaneous Q24H  . hydrALAZINE  50 mg Oral TID  . insulin aspart  0-15 Units Subcutaneous TID WC  . insulin aspart  0-5 Units Subcutaneous QHS  . lisinopril  20 mg Oral Daily  . metroNIDAZOLE  500 mg Oral BID  . pravastatin  40 mg Oral Daily  . sodium chloride flush  3 mL Intravenous Q12H   Continuous Infusions:   LOS: 1 day    Time spent:25 mins. More than 50% of that time was spent in counseling and/or coordination of care.      Shelly Coss, MD [facility] P12/06/2019, 7:38 AM

## 2020-02-18 ENCOUNTER — Inpatient Hospital Stay (HOSPITAL_COMMUNITY): Payer: Self-pay

## 2020-02-18 ENCOUNTER — Other Ambulatory Visit (HOSPITAL_COMMUNITY): Payer: Self-pay | Admitting: Internal Medicine

## 2020-02-18 LAB — BASIC METABOLIC PANEL
Anion gap: 13 (ref 5–15)
BUN: 16 mg/dL (ref 6–20)
CO2: 23 mmol/L (ref 22–32)
Calcium: 9.3 mg/dL (ref 8.9–10.3)
Chloride: 95 mmol/L — ABNORMAL LOW (ref 98–111)
Creatinine, Ser: 0.92 mg/dL (ref 0.44–1.00)
GFR, Estimated: >60 mL/min (ref >60)
Glucose, Bld: 218 mg/dL — ABNORMAL HIGH (ref 70–99)
Potassium: 3.9 mmol/L (ref 3.5–5.1)
Sodium: 131 mmol/L — ABNORMAL LOW (ref 135–145)

## 2020-02-18 LAB — GLUCOSE, CAPILLARY
Glucose-Capillary: 148 mg/dL — ABNORMAL HIGH (ref 70–99)
Glucose-Capillary: 152 mg/dL — ABNORMAL HIGH (ref 70–99)

## 2020-02-18 IMAGING — CT CT HEAD W/O CM
3 series · 16 of 47 positions shown, 19 images · non-contrast
Comparison: None.

CLINICAL DATA: Nausea vomiting.  Hypertension

EXAM:
CT HEAD WITHOUT CONTRAST
TECHNIQUE: Contiguous axial images were obtained from the base of the skull
through the vertex without intravenous contrast.

[Series 2: head wo · axial · 0.47mm/px · z∈[-142,-12]mm · 10 of 32 slices shown, 13 images]
[im 3/32  brain]
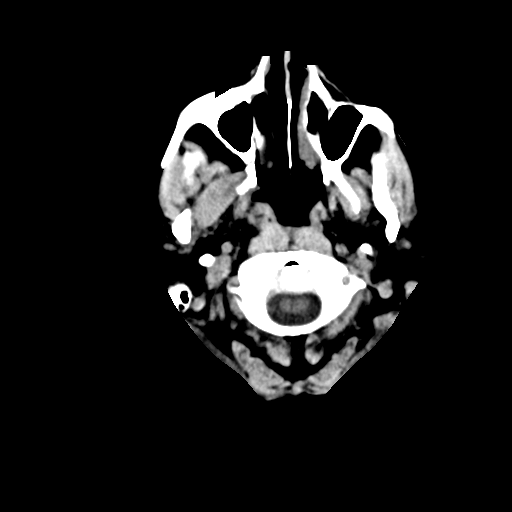
[im 3/32  bone]
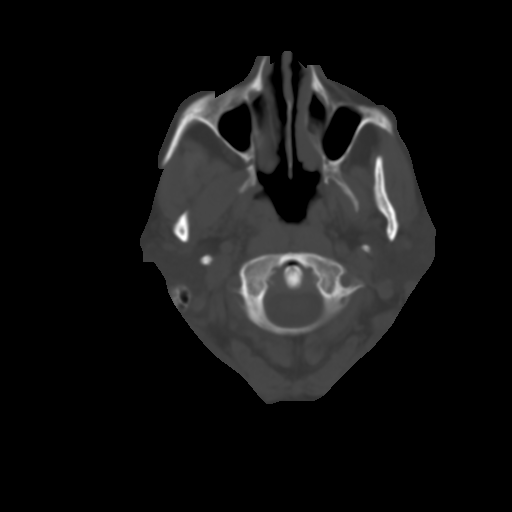
[im 6/32  brain]
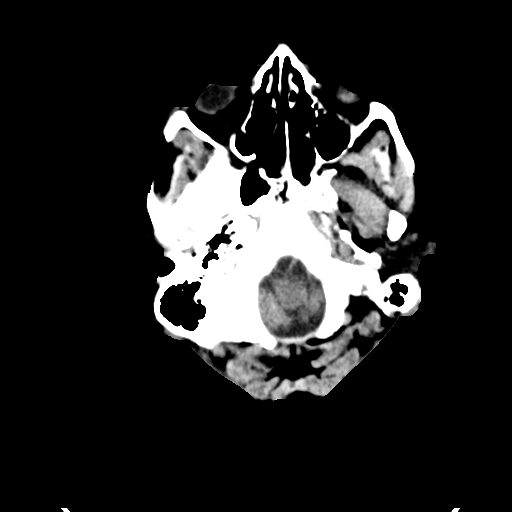
[im 9/32  brain]
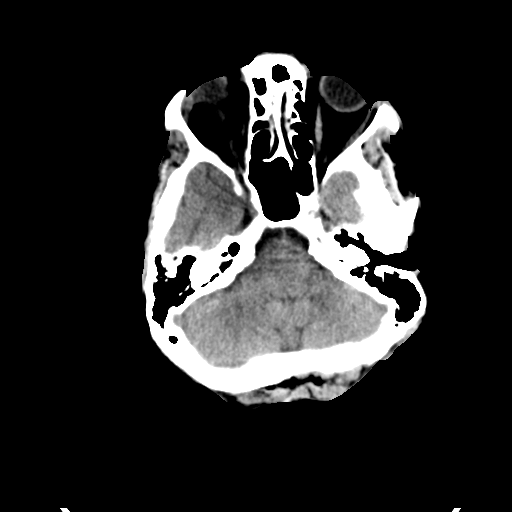
[im 11/32  brain]
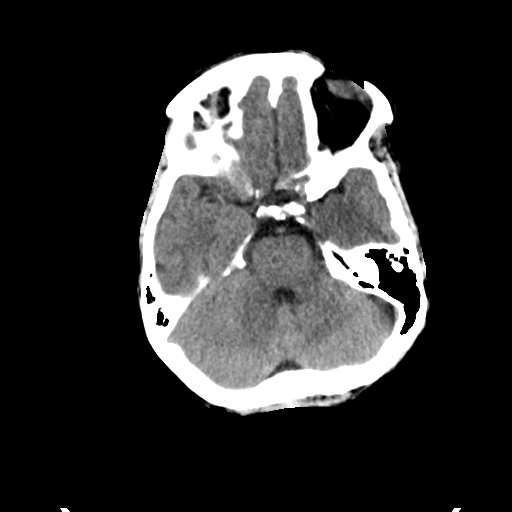
[im 14/32  brain]
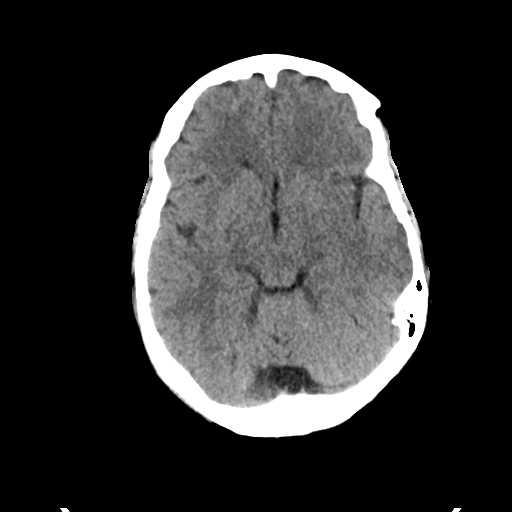
[im 14/32  bone]
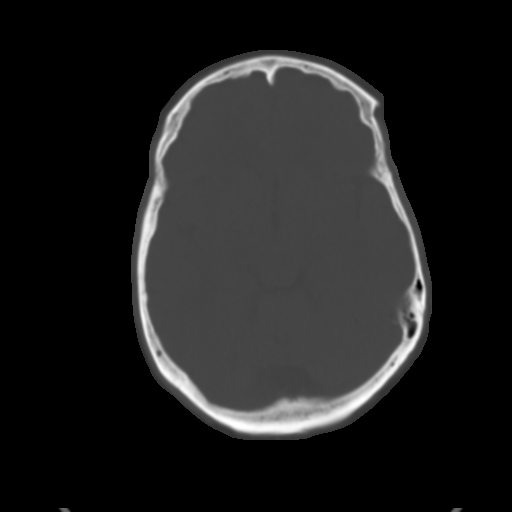
[im 18/32  brain]
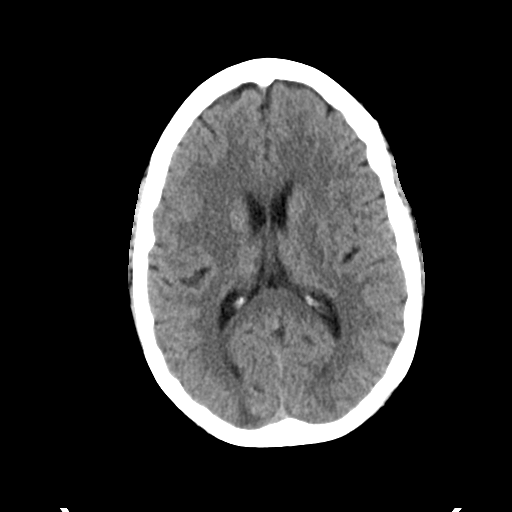
[im 21/32  brain]
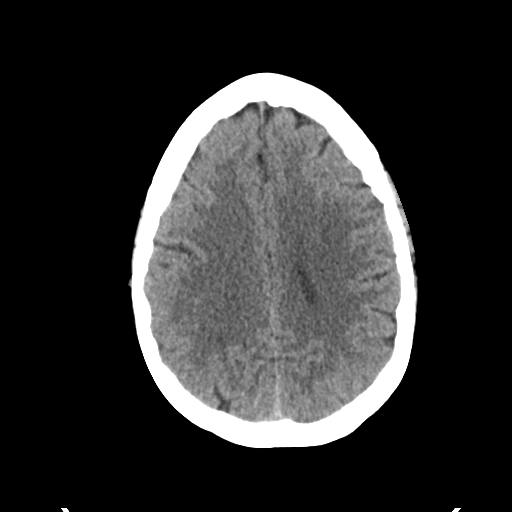
[im 24/32  brain]
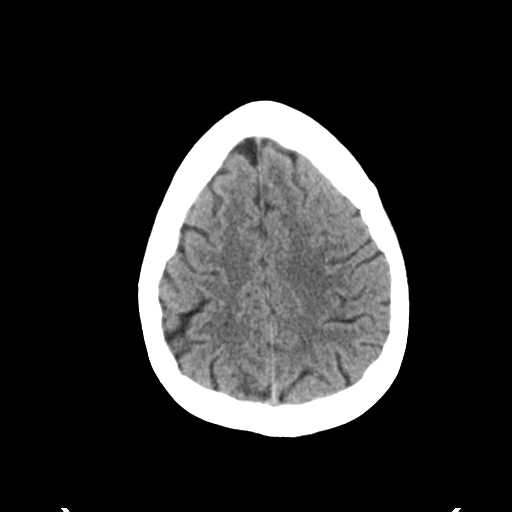
[im 26/32  brain]
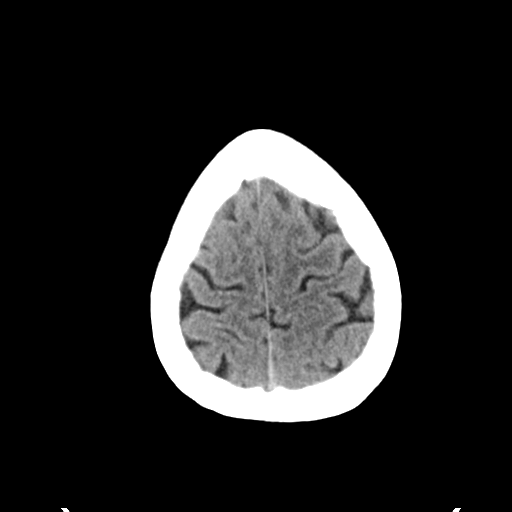
[im 26/32  bone]
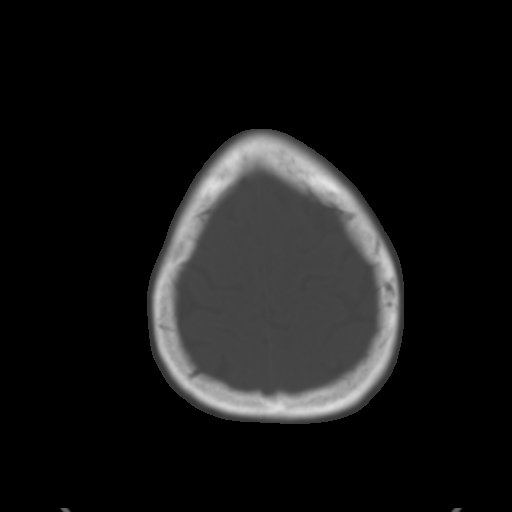
[im 29/32  brain]
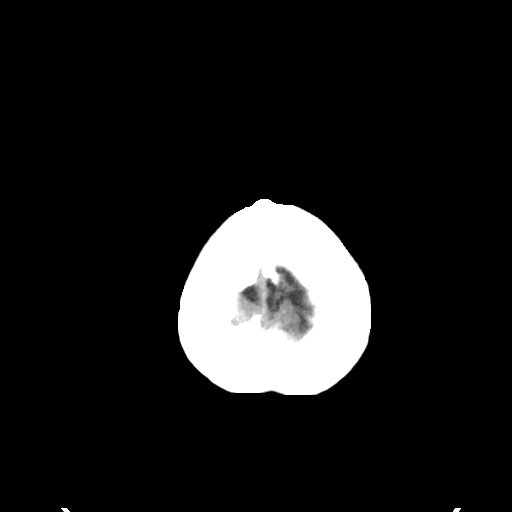

[Series 4: coronal soft tissue · coronal · 0.34mm/px · 3 of 68 slices shown]
[im 23/68  brain]
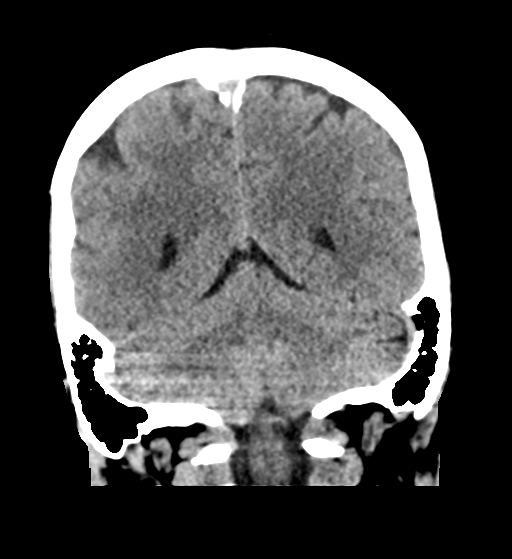
[im 30/68  brain]
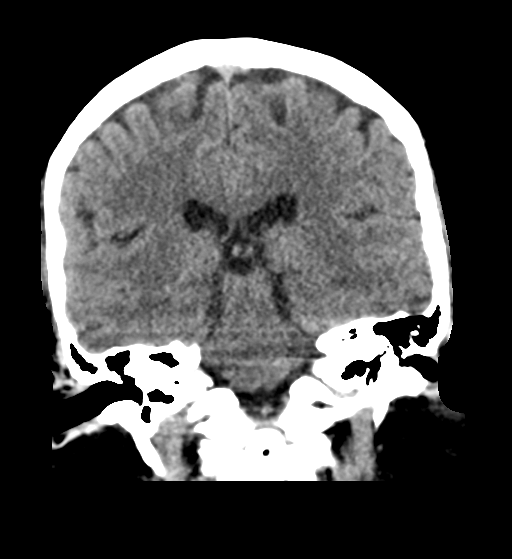
[im 38/68  brain]
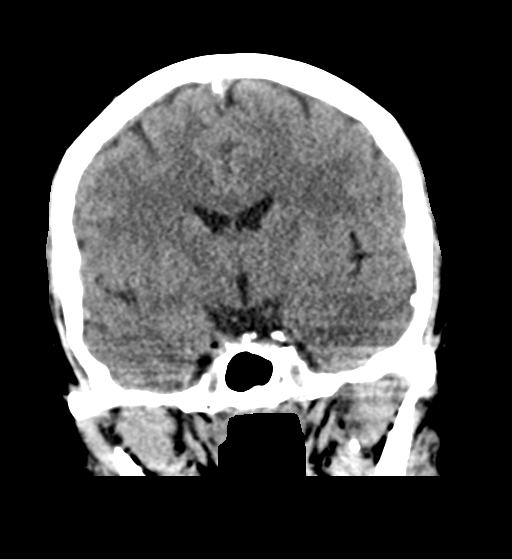

[Series 5: sagittal soft tissue · sagittal · 0.36mm/px · 3 of 55 slices shown]
[im 19/55  brain]
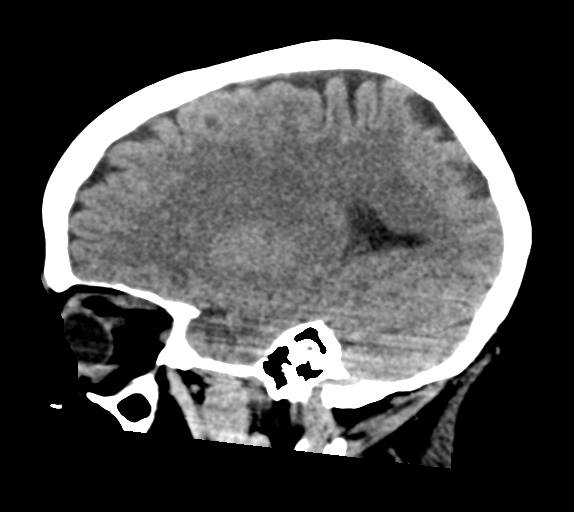
[im 28/55  brain]
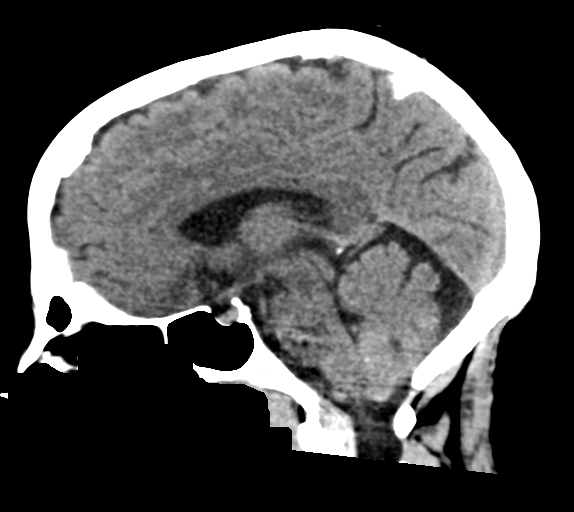
[im 37/55  brain]
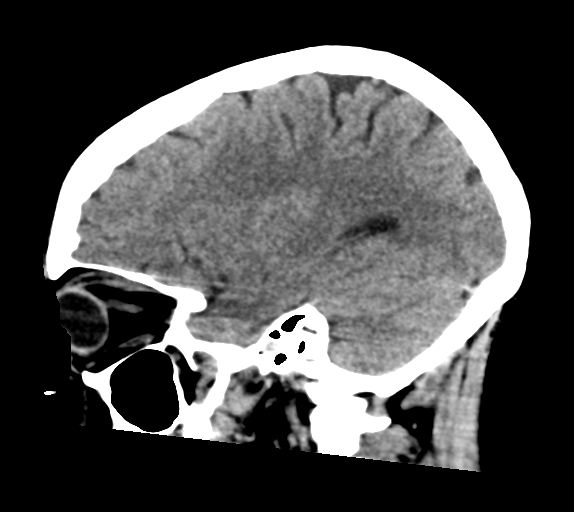

[16 of 47 positions shown; findings below may reference images not displayed]

FINDINGS: Brain: No evidence of acute infarction, hemorrhage, hydrocephalus,
extra-axial collection or mass lesion/mass effect.

Vascular: Negative for hyperdense vessel

Skull: Negative

Sinuses/Orbits: Paranasal sinuses clear.  Negative orbit

Other: None
IMPRESSION: Negative CT head

## 2020-02-18 MED ORDER — METOPROLOL TARTRATE 5 MG/5ML IV SOLN
5.0000 mg | INTRAVENOUS | Status: AC | PRN
  Administered 2020-02-18 (×2): 5 mg via INTRAVENOUS
  Filled 2020-02-18: qty 5

## 2020-02-18 MED ORDER — LISINOPRIL 20 MG PO TABS: 20.0000 mg | ORAL_TABLET | Freq: Every day | ORAL | 1 refills | Status: DC

## 2020-02-18 MED ORDER — LISINOPRIL 10 MG PO TABS
20.0000 mg | ORAL_TABLET | Freq: Every day | ORAL | Status: DC
  Administered 2020-02-18: 20 mg via ORAL
  Filled 2020-02-18: qty 2

## 2020-02-18 MED ORDER — METRONIDAZOLE 500 MG PO TABS: 500.0000 mg | ORAL_TABLET | Freq: Two times a day (BID) | ORAL | 0 refills | Status: DC

## 2020-02-18 MED ORDER — CLONIDINE HCL 0.1 MG PO TABS
0.2000 mg | ORAL_TABLET | Freq: Once | ORAL | Status: AC
  Administered 2020-02-18: 0.2 mg via ORAL
  Filled 2020-02-18: qty 2

## 2020-02-18 MED ORDER — SODIUM CHLORIDE 0.9 % IV BOLUS
500.0000 mL | Freq: Once | INTRAVENOUS | Status: AC
  Administered 2020-02-18: 500 mL via INTRAVENOUS

## 2020-02-18 MED ORDER — HYDRALAZINE HCL 50 MG PO TABS
50.0000 mg | ORAL_TABLET | Freq: Three times a day (TID) | ORAL | 1 refills | Status: DC
Start: 2020-02-18 — End: 2020-02-18

## 2020-02-18 MED ORDER — TRULICITY 0.75 MG/0.5ML ~~LOC~~ SOAJ
0.7500 mg | SUBCUTANEOUS | 1 refills | Status: DC
Start: 2020-02-18 — End: 2020-02-18

## 2020-02-18 MED ORDER — AMLODIPINE BESYLATE 10 MG PO TABS: 10.0000 mg | ORAL_TABLET | Freq: Every day | ORAL | 1 refills | Status: DC

## 2020-02-18 NOTE — Discharge Summary (Signed)
Physician Discharge Summary  Hannah Dougherty PVV:[id number] DOB: 04/23/1970 DOA: 02/16/2020  PCP: [name], NP  Admit date: 02/16/2020 Discharge date: 02/18/2020  Admitted From: Home Disposition:  Home  Discharge Condition:Stable CODE STATUS:FULL Diet recommendation: Heart Healthy  Brief/Interim Summary: Patient is a 49 year old female with history of diabetes mellitus, hypertension, hyperlipidemia, obesity who presented to [place] for the evaluation of nausea and vomiting. She was not taking her antihypertensive meds and diabetic medications for a week. On presentation she was extremely hypertensive with blood pressure in the range of 250/100s. Patient was admitted for the management of hypertensive urgency. Her blood pressure is currently stable. She is hemodynamically stable for discharge home today.  Following problems were addressed during her hospitalization:  Hypertensive urgency: Extremely hypertensive on presentation. Had to be started on nicardipine drip. Was not taking her meds for a week.   We have resumed her home medications with some adjustment.   Nausea/vomiting:  Resolved  Diabetes type 2: A1c of 7.9% as per 01/15/2020.  She was not taking home antidiabetic medication for about 2 weeks.  She was also hyperglycemic on arrival.  No evidence of DKA.  Continueur home insulin regimen. Closely follow-up with PCP as an outpatient  Right kidney mass: CT showed 2.6 cm heterogeneously enhancing soft tissue mass in the lower pole of the right kidney, highly suspicious for renal cell carcinoma.  Discussed with urology who recommended outpatient follow-up.  Left ovarian cyst: CT showed 3.2 cm benign-appearing cyst in the left ovary.  Follow-up with GYN as an outpatient recommended  Hypokalemia: Supplemented with potassium.  Trichomoniasis: Incidentally found on the urine.  Started on metronidazole  Obesity: BMI 31.6   Discharge Diagnoses:  Principal  Problem:   Hypertensive urgency Active Problems:   Diabetes ([place])   Hyperlipidemia associated with type 2 diabetes mellitus ([place])   Right kidney mass   Left ovarian cyst   Trichomoniasis    Discharge Instructions  Discharge Instructions    Diet - low sodium heart healthy   Complete by: As directed    Discharge instructions   Complete by: As directed    1)Please  take prescribed medications as instructed. 2)Follow up with your PCP in a week. 3)Monitor your blood pressure at home. 4)Follow up with [facility] in a week. Name and number of the provider group has been attached.   Increase activity slowly   Complete by: As directed      Allergies as of 02/18/2020   No Known Allergies     Medication List    STOP taking these medications   oxyCODONE-acetaminophen 5-325 MG tablet Commonly known as: PERCOCET/ROXICET     TAKE these medications   amLODipine 10 MG tablet Commonly known as: NORVASC Take 1 tablet (10 mg total) by mouth daily.   hydrALAZINE 50 MG tablet Commonly known as: APRESOLINE Take 1 tablet (50 mg total) by mouth 3 (three) times daily.   lisinopril 20 MG tablet Commonly known as: ZESTRIL Take 1 tablet (20 mg total) by mouth daily.   metroNIDAZOLE 500 MG tablet Commonly known as: FLAGYL Take 1 tablet (500 mg total) by mouth 2 (two) times daily for 4 days.   pravastatin 40 MG tablet Commonly known as: PRAVACHOL Take 1 tablet (40 mg total) by mouth daily.   ReliOn Confirm/micro Test test strip Generic drug: glucose blood Use as instructed   ReliOn Lancet Devices 30G Misc 1 each by Does not apply route 3 (three) times daily.   Trulicity  0.75 MG/0.5ML Sopn Generic drug: Dulaglutide Inject 0.75 mg into the skin once a week.       Follow-up Information    [name], NP. Schedule an appointment as soon as possible for a visit in 1 week(s).   Specialty: Internal Medicine Contact information: [address]  [address] [place]. Schedule an appointment as soon as possible for a visit in 1 week(s).   Contact information: [place] [phone]             No Known Allergies  Consultations:  None   Procedures/Studies: CT Abdomen Pelvis W Contrast  Result Date: 02/16/2020 CLINICAL DATA:  Nausea vomiting. EXAM: CT ABDOMEN AND PELVIS WITH CONTRAST TECHNIQUE: Multidetector CT imaging of the abdomen and pelvis was performed using the standard protocol following bolus administration of intravenous contrast. CONTRAST:  131mL OMNIPAQUE IOHEXOL 300 MG/ML  SOLN COMPARISON:  None. FINDINGS: Lower chest: Unremarkable. Hepatobiliary: No suspicious focal abnormality within the liver parenchyma. There is no evidence for gallstones, gallbladder wall thickening, or pericholecystic fluid. No intrahepatic or extrahepatic biliary dilation. Pancreas: No focal mass lesion. No dilatation of the main duct. No intraparenchymal cyst. No peripancreatic edema. Spleen: No splenomegaly. No focal mass lesion. Adrenals/Urinary Tract: No adrenal nodule or mass. 2.4 x 1.7 x 2.6 cm heterogeneously enhancing soft tissue mass is identified in the lower pole right kidney. Left kidney unremarkable. No evidence for hydroureter. The urinary bladder appears normal for the degree of distention. Stomach/Bowel: Stomach is unremarkable. No gastric wall thickening. No evidence of outlet obstruction. Duodenum is normally positioned as is the ligament of Treitz. No small bowel wall thickening. No small bowel dilatation. The terminal ileum is normal. The appendix is normal. Colon is diffusely decompressed without appreciable wall thickening or pericolonic edema/inflammation. Vascular/Lymphatic: There is abdominal aortic atherosclerosis without aneurysm. There is no gastrohepatic or hepatoduodenal ligament lymphadenopathy. No retroperitoneal or mesenteric  lymphadenopathy. No pelvic sidewall lymphadenopathy. Reproductive: Uterus unremarkable. 3.2 Cm benign appearing cyst noted in the left ovary. Right ovary unremarkable. Other: No intraperitoneal free fluid. Musculoskeletal: No worrisome lytic or sclerotic osseous abnormality. IMPRESSION: 1. 2.6 cm heterogeneously enhancing soft tissue mass in the lower pole right kidney, highly suspicious for renal cell carcinoma. Right renal vein is patent. No evidence for metastatic disease. 2. No findings to explain the history of nausea and vomiting. 3. 3.2 cm benign appearing cyst in left ovary. Given patient age and lesion size, follow-up ultrasound recommended to further evaluate. This recommendation follows ACR consensus guidelines: White Paper of the ACR Incidental Findings Committee II on Adnexal Findings. J Am Coll Radiol 2013:10:675-681. 4.  Aortic Atherosclerois (ICD10-170.0) Findings were called by me at the time of interpretation on 02/16/2020 at 2:08 pm to provider [facility] , who verbally acknowledged these results. Electronically Signed   By: Misty Stanley M.D.   On: 02/16/2020 14:08       Subjective: Patient seen and examined at the bedside this morning. Hemodynamically stable for discharge today.  Discharge Exam: Vitals:   02/18/20 0900 02/18/20 1000  BP: 128/73 108/73  Pulse: 67 (!) 59  Resp: 19 16  Temp: 98.4 F (36.9 C)   SpO2: 97% 98%   Vitals:   02/18/20 0700 02/18/20 0800 02/18/20 0900 02/18/20 1000  BP: 109/63 134/75 128/73 108/73  Pulse: 61 61 67 (!) 59  Resp: 13 15 19 16   Temp:   98.4 F (  36.9 C)   TempSrc:   Oral   SpO2: 97% 100% 97% 98%  Weight:      Height:        General: Pt is alert, awake, not in acute distress Cardiovascular: RRR, S1/S2 +, no rubs, no gallops Respiratory: CTA bilaterally, no wheezing, no rhonchi Abdominal: Soft, NT, ND, bowel sounds + Extremities: no edema, no cyanosis    The results of significant diagnostics from this  hospitalization (including imaging, microbiology, ancillary and laboratory) are listed below for reference.     Microbiology: Recent Results (from the past 240 hour(s))  Resp Panel by RT-PCR (Flu A&B, Covid) Nasopharyngeal Swab     Status: None   Collection Time: 02/16/20 11:17 AM   Specimen: Nasopharyngeal Swab; Nasopharyngeal(NP) swabs in vial transport medium  Result Value Ref Range Status   SARS Coronavirus 2 by RT PCR NEGATIVE NEGATIVE Final    Comment: (NOTE) SARS-CoV-2 target nucleic acids are NOT DETECTED.  The SARS-CoV-2 RNA is generally detectable in upper respiratory specimens during the acute phase of infection. The lowest concentration of SARS-CoV-2 viral copies this assay can detect is 138 copies/mL. A negative result does not preclude SARS-Cov-2 infection and should not be used as the sole basis for treatment or other patient management decisions. A negative result may occur with  improper specimen collection/handling, submission of specimen other than nasopharyngeal swab, presence of viral mutation(s) within the areas targeted by this assay, and inadequate number of viral copies(<138 copies/mL). A negative result must be combined with clinical observations, patient history, and epidemiological information. The expected result is Negative.  Fact Sheet for Patients:  EntrepreneurPulse.com.au  Fact Sheet for Healthcare Providers:  IncredibleEmployment.be  This test is no t yet approved or cleared by the [place] FDA and  has been authorized for detection and/or diagnosis of SARS-CoV-2 by FDA under an Emergency Use Authorization (EUA). This EUA will remain  in effect (meaning this test can be used) for the duration of the COVID-19 declaration under Section 564(b)(1) of the Act, 21 U.S.C.section 360bbb-3(b)(1), unless the authorization is terminated  or revoked sooner.       Influenza A by PCR NEGATIVE NEGATIVE Final    Influenza B by PCR NEGATIVE NEGATIVE Final    Comment: (NOTE) The Xpert Xpress SARS-CoV-2/FLU/RSV plus assay is intended as an aid in the diagnosis of influenza from Nasopharyngeal swab specimens and should not be used as a sole basis for treatment. Nasal washings and aspirates are unacceptable for Xpert Xpress SARS-CoV-2/FLU/RSV testing.  Fact Sheet for Patients: EntrepreneurPulse.com.au  Fact Sheet for Healthcare Providers: IncredibleEmployment.be  This test is not yet approved or cleared by the [place] FDA and has been authorized for detection and/or diagnosis of SARS-CoV-2 by FDA under an Emergency Use Authorization (EUA). This EUA will remain in effect (meaning this test can be used) for the duration of the COVID-19 declaration under Section 564(b)(1) of the Act, 21 U.S.C. section 360bbb-3(b)(1), unless the authorization is terminated or revoked.  Performed at [facility], [address] [place], [place] [address]   MRSA PCR Screening     Status: None   Collection Time: 02/16/20  9:59 PM   Specimen: Nasopharyngeal  Result Value Ref Range Status   MRSA by PCR NEGATIVE NEGATIVE Final    Comment:        The GeneXpert MRSA Assay (FDA approved for NASAL specimens only), is one component of a comprehensive MRSA colonization surveillance program. It is not intended to diagnose  MRSA infection nor to guide or monitor treatment for MRSA infections. Performed at [facility], [place] [address] [place], [place] [address]      Labs: BNP (last 3 results) No results for input(s): BNP in the last 8760 hours. Basic Metabolic Panel: Recent Labs  Lab 02/16/20 1031 02/16/20 1043 02/17/20 1101 02/18/20 0302  NA 134* 136 135 131*  K 3.6 3.9 3.0* 3.9  CL 98  --  101 95*  CO2 25  --  22 23  GLUCOSE 269*  --  184* 218*  BUN 12  --  17 16  CREATININE 1.07*  --  0.87 0.92  CALCIUM 9.1  --  8.9 9.3   Liver  Function Tests: Recent Labs  Lab 02/16/20 1031  AST 17  ALT 12  ALKPHOS 76  BILITOT 0.4  PROT 7.8  ALBUMIN 4.1   Recent Labs  Lab 02/16/20 1031  LIPASE 99*   No results for input(s): AMMONIA in the last 168 hours. CBC: Recent Labs  Lab 02/16/20 1031 02/16/20 1043  WBC 9.0  --   HGB 12.6 13.9  HCT 38.3 41.0  MCV 84.9  --   PLT 302  --    Cardiac Enzymes: No results for input(s): CKTOTAL, CKMB, CKMBINDEX, TROPONINI in the last 168 hours. BNP: Invalid input(s): POCBNP CBG: Recent Labs  Lab 02/17/20 0826 02/17/20 1406 02/17/20 1812 02/17/20 2109 02/18/20 0833  GLUCAP 212* 195* 181* 142* 148*   D-Dimer No results for input(s): DDIMER in the last 72 hours. Hgb A1c No results for input(s): HGBA1C in the last 72 hours. Lipid Profile No results for input(s): CHOL, HDL, LDLCALC, TRIG, CHOLHDL, LDLDIRECT in the last 72 hours. Thyroid function studies No results for input(s): TSH, T4TOTAL, T3FREE, THYROIDAB in the last 72 hours.  Invalid input(s): FREET3 Anemia work up No results for input(s): VITAMINB12, FOLATE, FERRITIN, TIBC, IRON, RETICCTPCT in the last 72 hours. Urinalysis    Component Value Date/Time   COLORURINE STRAW (A) 02/16/2020 1052   APPEARANCEUR CLEAR 02/16/2020 1052   LABSPEC 1.020 02/16/2020 1052   PHURINE 7.5 02/16/2020 1052   GLUCOSEU >=500 (A) 02/16/2020 1052   HGBUR NEGATIVE 02/16/2020 1052   BILIRUBINUR NEGATIVE 02/16/2020 1052   KETONESUR NEGATIVE 02/16/2020 1052   PROTEINUR 30 (A) 02/16/2020 1052   UROBILINOGEN 0.2 10/10/2009 1544   NITRITE NEGATIVE 02/16/2020 1052   LEUKOCYTESUR MODERATE (A) 02/16/2020 1052   Sepsis Labs Invalid input(s): PROCALCITONIN,  WBC,  LACTICIDVEN Microbiology Recent Results (from the past 240 hour(s))  Resp Panel by RT-PCR (Flu A&B, Covid) Nasopharyngeal Swab     Status: None   Collection Time: 02/16/20 11:17 AM   Specimen: Nasopharyngeal Swab; Nasopharyngeal(NP) swabs in vial transport medium  Result  Value Ref Range Status   SARS Coronavirus 2 by RT PCR NEGATIVE NEGATIVE Final    Comment: (NOTE) SARS-CoV-2 target nucleic acids are NOT DETECTED.  The SARS-CoV-2 RNA is generally detectable in upper respiratory specimens during the acute phase of infection. The lowest concentration of SARS-CoV-2 viral copies this assay can detect is 138 copies/mL. A negative result does not preclude SARS-Cov-2 infection and should not be used as the sole basis for treatment or other patient management decisions. A negative result may occur with  improper specimen collection/handling, submission of specimen other than nasopharyngeal swab, presence of viral mutation(s) within the areas targeted by this assay, and inadequate number of viral copies(<138 copies/mL). A negative result must be combined with clinical observations, patient history, and epidemiological information.  The expected result is Negative.  Fact Sheet for Patients:  EntrepreneurPulse.com.au  Fact Sheet for Healthcare Providers:  IncredibleEmployment.be  This test is no t yet approved or cleared by the [place] FDA and  has been authorized for detection and/or diagnosis of SARS-CoV-2 by FDA under an Emergency Use Authorization (EUA). This EUA will remain  in effect (meaning this test can be used) for the duration of the COVID-19 declaration under Section 564(b)(1) of the Act, 21 U.S.C.section 360bbb-3(b)(1), unless the authorization is terminated  or revoked sooner.       Influenza A by PCR NEGATIVE NEGATIVE Final   Influenza B by PCR NEGATIVE NEGATIVE Final    Comment: (NOTE) The Xpert Xpress SARS-CoV-2/FLU/RSV plus assay is intended as an aid in the diagnosis of influenza from Nasopharyngeal swab specimens and should not be used as a sole basis for treatment. Nasal washings and aspirates are unacceptable for Xpert Xpress SARS-CoV-2/FLU/RSV testing.  Fact Sheet for  Patients: EntrepreneurPulse.com.au  Fact Sheet for Healthcare Providers: IncredibleEmployment.be  This test is not yet approved or cleared by the [place] FDA and has been authorized for detection and/or diagnosis of SARS-CoV-2 by FDA under an Emergency Use Authorization (EUA). This EUA will remain in effect (meaning this test can be used) for the duration of the COVID-19 declaration under Section 564(b)(1) of the Act, 21 U.S.C. section 360bbb-3(b)(1), unless the authorization is terminated or revoked.  Performed at [facility], [address] [place], [place] [address]   MRSA PCR Screening     Status: None   Collection Time: 02/16/20  9:59 PM   Specimen: Nasopharyngeal  Result Value Ref Range Status   MRSA by PCR NEGATIVE NEGATIVE Final    Comment:        The GeneXpert MRSA Assay (FDA approved for NASAL specimens only), is one component of a comprehensive MRSA colonization surveillance program. It is not intended to diagnose MRSA infection nor to guide or monitor treatment for MRSA infections. Performed at [facility], [place] [address] [place], [place] [address]     Please note: You were cared for by a hospitalist during your hospital stay. Once you are discharged, your primary care physician will handle any further medical issues. Please note that NO REFILLS for any discharge medications will be authorized once you are discharged, as it is imperative that you return to your primary care physician (or establish a relationship with a primary care physician if you do not have one) for your post hospital discharge needs so that they can reassess your need for medications and monitor your lab values.    Time coordinating discharge: 40 minutes  SIGNED:   Shelly Coss, MD  [facility] 02/18/2020, 10:36 AM Pager [phone]  If 7PM-7AM, please contact night-coverage [email or web address] Password  [email or web address]

## 2020-02-18 NOTE — TOC Progression Note (Signed)
Transition of Care [facility]) - Progression Note    Patient Details  Name: Hannah Dougherty MRN: [id number] Date of Birth: 01/20/1971  [facility] CM/SW Contact  Joaquin Courts, RN Phone Number: 02/18/2020, 11:48 AM  Clinical Narrative:    CM spoke with patient who reports she uses the [facility] pharmacy to fill her prescriptions where she receives financial assistance with the cost of her medications.  No further CM needs identified.  Expected Discharge Plan: Home/Self Care Barriers to Discharge: No Barriers Identified  Expected Discharge Plan and Services Expected Discharge Plan: Home/Self Care   Discharge Planning Services: CM Consult   Living arrangements for the past 2 months: Apartment Expected Discharge Date: 02/18/20                                     Social Determinants of Health (SDOH) Interventions    Readmission Risk Interventions No flowsheet data found.

## 2020-02-18 NOTE — Progress Notes (Signed)
At 00:10 patient's BP=184/103, gave 10mg  Metoprolol IV. At 02:00 BP=215/90, paged on-call X.Blount for additional medications. Ordered and given additional 10mg  Metoprolol IV and 0.2mg  Clonidine PO. Improved at 167/81 but at 4:00 became 95/56, patient asymptomatic and conversant. Paged X.Blount and she ordered 500 ml NS bolus. Will continue to monitor the patient.

## 2020-02-19 ENCOUNTER — Telehealth: Payer: Self-pay

## 2020-02-19 MED FILL — hydrALAZINE HCL 50 MG TABS: 50 | 30 days supply | Qty: 90 | Fill #0

## 2020-02-19 MED FILL — LISINOPRIL 20 MG TABLET: 20 | 30 days supply | Qty: 30 | Fill #0

## 2020-02-19 MED FILL — $TRULICITY 0.75 MG/0.5 ML P: 0.75 | 56 days supply | Qty: 4 | Fill #0

## 2020-02-19 MED FILL — metroNIDAZOLE 500 MG TABS: 500 | 4 days supply | Qty: 8 | Fill #0

## 2020-02-19 MED FILL — AMLODIPINE BESYLATE 10 MG T: 10 | 30 days supply | Qty: 30 | Fill #0

## 2020-02-19 NOTE — Telephone Encounter (Signed)
Transition Care Management Unsuccessful Follow-up Telephone Call  Date of discharge and from where:  02/18/2020, [facility]   Attempts:  1st Attempt  Reason for unsuccessful TCM follow-up call:  Left voice message - call placed to # [phone].  Patient needs to schedule hospital follow up appointment with PCP

## 2020-02-20 ENCOUNTER — Telehealth: Payer: Self-pay

## 2020-02-20 NOTE — Telephone Encounter (Signed)
Transition Care Management Unsuccessful Follow-up Telephone Call  Date of discharge and from where:  02/18/2020, [facility]   Attempts:  2nd Attempt  Reason for unsuccessful TCM follow-up call:  Left voice message call placed to # [phone].  Patient has appointment at [facility] 04/16/2020. Letter sent to her requesting she contact [facility] to schedule a hospital follow up appointment.

## 2020-03-29 ENCOUNTER — Inpatient Hospital Stay (INDEPENDENT_AMBULATORY_CARE_PROVIDER_SITE_OTHER): Payer: Self-pay | Admitting: Primary Care

## 2020-04-16 ENCOUNTER — Ambulatory Visit (INDEPENDENT_AMBULATORY_CARE_PROVIDER_SITE_OTHER): Payer: Self-pay | Admitting: Primary Care

## 2020-06-19 ENCOUNTER — Encounter (INDEPENDENT_AMBULATORY_CARE_PROVIDER_SITE_OTHER): Payer: Self-pay | Admitting: Primary Care

## 2020-06-19 ENCOUNTER — Ambulatory Visit (INDEPENDENT_AMBULATORY_CARE_PROVIDER_SITE_OTHER): Payer: Self-pay | Admitting: Primary Care

## 2020-06-19 ENCOUNTER — Other Ambulatory Visit: Payer: Self-pay

## 2020-06-19 VITALS — BP 174/96 | HR 58 | Temp 97.5°F | Ht 65.0 in | Wt 220.8 lb

## 2020-06-19 DIAGNOSIS — E782 Mixed hyperlipidemia: Secondary | ICD-10-CM

## 2020-06-19 DIAGNOSIS — Z794 Long term (current) use of insulin: Secondary | ICD-10-CM

## 2020-06-19 DIAGNOSIS — I10 Essential (primary) hypertension: Secondary | ICD-10-CM

## 2020-06-19 DIAGNOSIS — E119 Type 2 diabetes mellitus without complications: Secondary | ICD-10-CM

## 2020-06-19 LAB — POCT GLYCOSYLATED HEMOGLOBIN (HGB A1C): Hemoglobin A1C: 10 % — AB (ref 4.0–5.6)

## 2020-06-19 MED ORDER — DULAGLUTIDE 0.75 MG/0.5ML ~~LOC~~ SOAJ
0.7500 mg | SUBCUTANEOUS | 1 refills | Status: DC
  Filled 2020-06-19: qty 2, 28d supply, fill #0

## 2020-06-19 MED ORDER — PRAVASTATIN SODIUM 40 MG PO TABS
40.0000 mg | ORAL_TABLET | Freq: Every day | ORAL | 1 refills | Status: DC
  Filled 2020-06-19: qty 30, 30d supply, fill #0

## 2020-06-19 MED ORDER — HYDROCHLOROTHIAZIDE 25 MG PO TABS
25.0000 mg | ORAL_TABLET | Freq: Every day | ORAL | 3 refills | Status: DC
  Filled 2020-06-19: qty 30, 30d supply, fill #0

## 2020-06-19 MED ORDER — LISINOPRIL 20 MG PO TABS
ORAL_TABLET | Freq: Every day | ORAL | 1 refills | Status: DC
  Filled 2020-06-19: qty 30, 30d supply, fill #0

## 2020-06-19 MED ORDER — CARVEDILOL 12.5 MG PO TABS
12.5000 mg | ORAL_TABLET | Freq: Two times a day (BID) | ORAL | 3 refills | Status: DC
  Filled 2020-06-19: qty 60, 30d supply, fill #0

## 2020-06-19 MED ORDER — AMLODIPINE BESYLATE 10 MG PO TABS
ORAL_TABLET | Freq: Every day | ORAL | 1 refills | Status: DC
  Filled 2020-06-19: qty 30, 30d supply, fill #0

## 2020-06-19 NOTE — Patient Instructions (Signed)
Diabetes Mellitus and Nutrition, Adult When you have diabetes, or diabetes mellitus, it is very important to have healthy eating habits because your blood sugar (glucose) levels are greatly affected by what you eat and drink. Eating healthy foods in the right amounts, at about the same times every day, can help you:  Control your blood glucose.  Lower your risk of heart disease.  Improve your blood pressure.  Reach or maintain a healthy weight. What can affect my meal plan? Every person with diabetes is different, and each person has different needs for a meal plan. Your health care provider may recommend that you work with a dietitian to make a meal plan that is best for you. Your meal plan may vary depending on factors such as:  The calories you need.  The medicines you take.  Your weight.  Your blood glucose, blood pressure, and cholesterol levels.  Your activity level.  Other health conditions you have, such as heart or kidney disease. How do carbohydrates affect me? Carbohydrates, also called carbs, affect your blood glucose level more than any other type of food. Eating carbs naturally raises the amount of glucose in your blood. Carb counting is a method for keeping track of how many carbs you eat. Counting carbs is important to keep your blood glucose at a healthy level, especially if you use insulin or take certain oral diabetes medicines. It is important to know how many carbs you can safely have in each meal. This is different for every person. Your dietitian can help you calculate how many carbs you should have at each meal and for each snack. How does alcohol affect me? Alcohol can cause a sudden decrease in blood glucose (hypoglycemia), especially if you use insulin or take certain oral diabetes medicines. Hypoglycemia can be a life-threatening condition. Symptoms of hypoglycemia, such as sleepiness, dizziness, and confusion, are similar to symptoms of having too much  alcohol.  Do not drink alcohol if: ? Your health care provider tells you not to drink. ? You are pregnant, may be pregnant, or are planning to become pregnant.  If you drink alcohol: ? Do not drink on an empty stomach. ? Limit how much you use to:  0-1 drink a day for women.  0-2 drinks a day for men. ? Be aware of how much alcohol is in your drink. In the [place] one drink equals one 12 oz bottle of beer (355 mL), one 5 oz glass of wine (148 mL), or one 1 oz glass of hard liquor (44 mL). ? Keep yourself hydrated with water, diet soda, or unsweetened iced tea.  Keep in mind that regular soda, juice, and other mixers may contain a lot of sugar and must be counted as carbs. What are tips for following this plan? Reading food labels  Start by checking the serving size on the "Nutrition Facts" label of packaged foods and drinks. The amount of calories, carbs, fats, and other nutrients listed on the label is based on one serving of the item. Many items contain more than one serving per package.  Check the total grams (g) of carbs in one serving. You can calculate the number of servings of carbs in one serving by dividing the total carbs by 15. For example, if a food has 30 g of total carbs per serving, it would be equal to 2 servings of carbs.  Check the number of grams (g) of saturated fats and trans fats in one serving. Choose foods that have   a low amount or none of these fats.  Check the number of milligrams (mg) of salt (sodium) in one serving. Most people should limit total sodium intake to less than 2,300 mg per day.  Always check the nutrition information of foods labeled as "low-fat" or "nonfat." These foods may be higher in added sugar or refined carbs and should be avoided.  Talk to your dietitian to identify your daily goals for nutrients listed on the label. Shopping  Avoid buying canned, pre-made, or processed foods. These foods tend to be high in fat, sodium, and added  sugar.  Shop around the outside edge of the grocery store. This is where you will most often find fresh fruits and vegetables, bulk grains, fresh meats, and fresh dairy. Cooking  Use low-heat cooking methods, such as baking, instead of high-heat cooking methods like deep frying.  Cook using healthy oils, such as olive, canola, or sunflower oil.  Avoid cooking with butter, cream, or high-fat meats. Meal planning  Eat meals and snacks regularly, preferably at the same times every day. Avoid going long periods of time without eating.  Eat foods that are high in fiber, such as fresh fruits, vegetables, beans, and whole grains. Talk with your dietitian about how many servings of carbs you can eat at each meal.  Eat 4-6 oz (112-168 g) of lean protein each day, such as lean meat, chicken, fish, eggs, or tofu. One ounce (oz) of lean protein is equal to: ? 1 oz (28 g) of meat, chicken, or fish. ? 1 egg. ?  cup (62 g) of tofu.  Eat some foods each day that contain healthy fats, such as avocado, nuts, seeds, and fish.   What foods should I eat? Fruits Berries. Apples. Oranges. Peaches. Apricots. Plums. Grapes. Mango. Papaya. Pomegranate. Kiwi. Cherries. Vegetables Lettuce. Spinach. Leafy greens, including kale, chard, collard greens, and mustard greens. Beets. Cauliflower. Cabbage. Broccoli. Carrots. Green beans. Tomatoes. Peppers. Onions. Cucumbers. Brussels sprouts. Grains Whole grains, such as whole-wheat or whole-grain bread, crackers, tortillas, cereal, and pasta. Unsweetened oatmeal. Quinoa. Brown or wild rice. Meats and other proteins Seafood. Poultry without skin. Lean cuts of poultry and beef. Tofu. Nuts. Seeds. Dairy Low-fat or fat-free dairy products such as milk, yogurt, and cheese. The items listed above may not be a complete list of foods and beverages you can eat. Contact a dietitian for more information. What foods should I avoid? Fruits Fruits canned with  syrup. Vegetables Canned vegetables. Frozen vegetables with butter or cream sauce. Grains Refined white flour and flour products such as bread, pasta, snack foods, and cereals. Avoid all processed foods. Meats and other proteins Fatty cuts of meat. Poultry with skin. Breaded or fried meats. Processed meat. Avoid saturated fats. Dairy Full-fat yogurt, cheese, or milk. Beverages Sweetened drinks, such as soda or iced tea. The items listed above may not be a complete list of foods and beverages you should avoid. Contact a dietitian for more information. Questions to ask a health care provider  Do I need to meet with a diabetes educator?  Do I need to meet with a dietitian?  What number can I call if I have questions?  When are the best times to check my blood glucose? Where to find more information:  American Diabetes Association: diabetes.org  Academy of Nutrition and Dietetics: www.eatright.org  National Institute of Diabetes and Digestive and Kidney Diseases: www.niddk.nih.gov  Association of Diabetes Care and Education Specialists: www.diabeteseducator.org Summary  It is important to have healthy eating   habits because your blood sugar (glucose) levels are greatly affected by what you eat and drink.  A healthy meal plan will help you control your blood glucose and maintain a healthy lifestyle.  Your health care provider may recommend that you work with a dietitian to make a meal plan that is best for you.  Keep in mind that carbohydrates (carbs) and alcohol have immediate effects on your blood glucose levels. It is important to count carbs and to use alcohol carefully. This information is not intended to replace advice given to you by your health care provider. Make sure you discuss any questions you have with your health care provider. Document Revised: 02/07/2019 Document Reviewed: 02/07/2019 Elsevier Patient Education  2021 Elsevier Inc.  

## 2020-06-20 ENCOUNTER — Other Ambulatory Visit: Payer: Self-pay

## 2020-06-21 ENCOUNTER — Ambulatory Visit: Payer: Medicaid Other | Admitting: Pharmacist

## 2020-06-24 ENCOUNTER — Other Ambulatory Visit: Payer: Self-pay

## 2020-06-24 ENCOUNTER — Ambulatory Visit (INDEPENDENT_AMBULATORY_CARE_PROVIDER_SITE_OTHER): Payer: Self-pay

## 2020-06-24 DIAGNOSIS — Z111 Encounter for screening for respiratory tuberculosis: Secondary | ICD-10-CM

## 2020-06-25 ENCOUNTER — Ambulatory Visit: Payer: Medicaid Other | Admitting: Pharmacist

## 2020-06-26 ENCOUNTER — Other Ambulatory Visit: Payer: Self-pay

## 2020-06-28 ENCOUNTER — Ambulatory Visit: Payer: Medicaid Other

## 2020-07-01 ENCOUNTER — Other Ambulatory Visit: Payer: Self-pay

## 2020-07-02 NOTE — Progress Notes (Signed)
Established Patient Office Visit  Subjective:  Patient ID: Hannah Dougherty, female    DOB: 10-06-1970  Age: 50 y.o. MRN: [id number]  CC:  Chief Complaint  Patient presents with  . Mass    Already followed by urology    HPI Hannah Dougherty is a 50 year old female who is concerned about a mass on her kidneys reviewing of imaging-soft tissue mass is identified in the lower pole right kidney referred back to urology for questions and concerns.  Blood pressure is elevated and will be seen for hypertension management- Denies shortness of breath, headaches, chest pain or lower extremity edema, sudden onset, vision changes, unilateral weakness, dizziness, paresthesias and uncontrolled diabetes-she does have polyuria and denies polydipsia polyphagia or visual changes  Past Medical History:  Diagnosis Date  . Diabetes mellitus without complication ([place])   . Hypertension     Past Surgical History:  Procedure Laterality Date  . TUBAL LIGATION    . VOCAL CORD LATERALIZATION, ENDOSCOPIC APPROACH W/ MLB      Family History  Problem Relation Age of Onset  . Hypertension Mother   . Diabetes Mother   . Kidney disease Mother   . Diabetes Father   . Hypertension Father   . Hypertension Maternal Grandmother   . Diabetes Maternal Grandmother   . Breast cancer Maternal Grandmother     Social History   Socioeconomic History  . Marital status: Single    Spouse name: Not on file  . Number of children: Not on file  . Years of education: Not on file  . Highest education level: Not on file  Occupational History  . Not on file  Tobacco Use  . Smoking status: Current Some Day Smoker    Packs/day: 0.50    Types: Cigarettes  . Smokeless tobacco: Never Used  Substance and Sexual Activity  . Alcohol use: Yes  . Drug use: Yes    Types: Marijuana  . Sexual activity: Not on file  Other Topics Concern  . Not on file  Social History Narrative  . Not on file   Social Determinants of Health    Financial Resource Strain: Not on file  Food Insecurity: Not on file  Transportation Needs: Not on file  Physical Activity: Not on file  Stress: Not on file  Social Connections: Not on file  Intimate Partner Violence: Not on file    Outpatient Medications Prior to Visit  Medication Sig Dispense Refill  . glucose blood (RELION CONFIRM/MICRO TEST) test strip Use as instructed 100 each 0  . ReliOn Lancet Devices 30G MISC 1 each by Does not apply route 3 (three) times daily. 100 each 0  . amLODipine (NORVASC) 10 MG tablet TAKE 1 TABLET (10 MG TOTAL) BY MOUTH DAILY. 30 tablet 1  . Dulaglutide 0.75 MG/0.5ML SOPN INJECT 0.75 MG INTO THE SKIN ONCE A WEEK. 2 mL 1  . hydrALAZINE (APRESOLINE) 50 MG tablet TAKE 1 TABLET (50 MG TOTAL) BY MOUTH 3 (THREE) TIMES DAILY. 90 tablet 1  . lisinopril (ZESTRIL) 20 MG tablet TAKE 1 TABLET (20 MG TOTAL) BY MOUTH DAILY. 30 tablet 1  . metroNIDAZOLE (FLAGYL) 500 MG tablet TAKE 1 TABLET (500 MG TOTAL) BY MOUTH 2 (TWO) TIMES DAILY FOR 4 DAYS. 8 tablet 0  . pravastatin (PRAVACHOL) 40 MG tablet Take 1 tablet (40 mg total) by mouth daily. 90 tablet 1   No facility-administered medications prior to visit.    Not on File  ROS Review of Systems  Endocrine: Positive  for polyuria.       Uncontrolled diabetes  Neurological: Positive for light-headedness.       Can be associated with elevated blood pressure  All other systems reviewed and are negative.     Objective:    Physical Exam Vitals reviewed.  Constitutional:      Appearance: She is obese.  HENT:     Head: Normocephalic.     Right Ear: External ear normal.     Left Ear: External ear normal.     Nose: Nose normal.  Eyes:     Extraocular Movements: Extraocular movements intact.  Cardiovascular:     Rate and Rhythm: Normal rate and regular rhythm.  Pulmonary:     Effort: Pulmonary effort is normal.     Breath sounds: Normal breath sounds.  Abdominal:     General: Bowel sounds are normal.  There is distension.     Palpations: Abdomen is soft.  Musculoskeletal:        General: Normal range of motion.     Cervical back: Normal range of motion and neck supple.  Skin:    General: Skin is warm and dry.  Neurological:     Mental Status: She is alert and oriented to person, place, and time.  Psychiatric:        Mood and Affect: Mood normal.        Behavior: Behavior normal.        Thought Content: Thought content normal.        Judgment: Judgment normal.     BP (!) 174/96 (BP Location: Right Arm, Patient Position: Sitting, Cuff Size: Large)   Pulse (!) 58   Temp (!) 97.5 F (36.4 C) (Temporal)   Ht 5\' 5"  (1.651 m)   Wt 220 lb 12.8 oz (100.2 kg)   LMP 10/28/2016 (Within Weeks)   SpO2 97%   BMI 36.74 kg/m  Wt Readings from Last 3 Encounters:  06/19/20 220 lb 12.8 oz (100.2 kg)  02/16/20 190 lb (86.2 kg)  01/15/20 198 lb (89.8 kg)     Health Maintenance Due  Topic Date Due  . Hepatitis C Screening  Never done  . PNEUMOCOCCAL POLYSACCHARIDE VACCINE AGE 23-64 HIGH RISK  Never done  . COVID-19 Vaccine (1) Never done  . OPHTHALMOLOGY EXAM  Never done  . PAP SMEAR-Modifier  Never done  . COLONOSCOPY (Pts 45-33yrs Insurance coverage will need to be confirmed)  Never done  . MAMMOGRAM  Never done    There are no preventive care reminders to display for this patient.  Lab Results  Component Value Date   TSH 0.371 (L) 01/15/2020   Lab Results  Component Value Date   WBC 9.0 02/16/2020   HGB 13.9 02/16/2020   HCT 41.0 02/16/2020   MCV 84.9 02/16/2020   PLT 302 02/16/2020   Lab Results  Component Value Date   NA 131 (L) 02/18/2020   K 3.9 02/18/2020   CO2 23 02/18/2020   GLUCOSE 218 (H) 02/18/2020   BUN 16 02/18/2020   CREATININE 0.92 02/18/2020   BILITOT 0.4 02/16/2020   ALKPHOS 76 02/16/2020   AST 17 02/16/2020   ALT 12 02/16/2020   PROT 7.8 02/16/2020   ALBUMIN 4.1 02/16/2020   CALCIUM 9.3 02/18/2020   ANIONGAP 13 02/18/2020   Lab Results   Component Value Date   CHOL 221 (H) 01/15/2020   Lab Results  Component Value Date   HDL 61 01/15/2020   Lab Results  Component Value Date  LDLCALC 146 (H) 01/15/2020   Lab Results  Component Value Date   TRIG 82 01/15/2020   Lab Results  Component Value Date   CHOLHDL 3.6 01/15/2020   Lab Results  Component Value Date   HGBA1C 10.0 (A) 06/19/2020      Assessment & Plan:  [name] was seen today for mass.  Diagnoses and all orders for this visit:  Type 2 diabetes mellitus without complication, with long-term current use of insulin (HCC) Uncontrolled diabetes-Discussed  co- morbidities with uncontrol diabetes  Complications -diabetic retinopathy, (close your eyes ? What do you see nothing) nephropathy decrease in kidney function- can lead to dialysis-on a machine 3 days a week to filter your kidney, neuropathy- numbness and tinging in your hands and feet,  increase risk of heart attack and stroke, and amputation due to decrease wound healing and circulation. Decrease your risk by taking medication daily as prescribed, monitor carbohydrates- foods that are high in carbohydrates are the following rice, potatoes, breads, sugars, and pastas.  Reduction in the intake (eating) will assist in lowering your blood sugars. Exercise daily at least 30 minutes daily. -     HgB A1c -     Dulaglutide 0.75 MG/0.5ML SOPN; INJECT 0.75 MG INTO THE SKIN ONCE A WEEK.  Essential hypertension Counseled on  uncontrolled blood pressure goal of less than 130/80, low-sodium, DASH diet, medication compliance, 150 minutes of moderate intensity exercise per week. Discussed medication compliance, adverse effects. -     amLODipine (NORVASC) 10 MG tablet; TAKE 1 TABLET (10 MG TOTAL) BY MOUTH DAILY. -     lisinopril (ZESTRIL) 20 MG tablet; TAKE 1 TABLET (20 MG TOTAL) BY MOUTH DAILY. -     carvedilol (COREG) 12.5 MG tablet; Take 1 tablet (12.5 mg total) by mouth 2 (two) times daily with a meal. -      hydrochlorothiazide (HYDRODIURIL) 25 MG tablet; Take 1 tablet (25 mg total) by mouth daily.  Mixed hyperlipidemia  Decrease your fatty foods, red meat, cheese, milk and increase fiber like whole grains and veggies. You can also add a fiber supplement like Metamucil or Benefiber.  -     pravastatin (PRAVACHOL) 40 MG tablet; Take 1 tablet (40 mg total) by mouth daily.    Meds ordered this encounter  Medications  . amLODipine (NORVASC) 10 MG tablet    Sig: TAKE 1 TABLET (10 MG TOTAL) BY MOUTH DAILY.    Dispense:  90 tablet    Refill:  1  . lisinopril (ZESTRIL) 20 MG tablet    Sig: TAKE 1 TABLET (20 MG TOTAL) BY MOUTH DAILY.    Dispense:  90 tablet    Refill:  1  . Dulaglutide 0.75 MG/0.5ML SOPN    Sig: INJECT 0.75 MG INTO THE SKIN ONCE A WEEK.    Dispense:  2 mL    Refill:  1  . pravastatin (PRAVACHOL) 40 MG tablet    Sig: Take 1 tablet (40 mg total) by mouth daily.    Dispense:  90 tablet    Refill:  1  . carvedilol (COREG) 12.5 MG tablet    Sig: Take 1 tablet (12.5 mg total) by mouth 2 (two) times daily with a meal.    Dispense:  60 tablet    Refill:  3  . hydrochlorothiazide (HYDRODIURIL) 25 MG tablet    Sig: Take 1 tablet (25 mg total) by mouth daily.    Dispense:  90 tablet    Refill:  3    Follow-up:  Return in about 1 month (around 07/19/2020) for first available with Lurena Joiner / PCP .    [name], NP

## 2020-07-03 ENCOUNTER — Ambulatory Visit: Payer: Medicaid Other

## 2020-07-18 ENCOUNTER — Ambulatory Visit (INDEPENDENT_AMBULATORY_CARE_PROVIDER_SITE_OTHER): Payer: Medicaid Other

## 2020-07-22 ENCOUNTER — Ambulatory Visit (INDEPENDENT_AMBULATORY_CARE_PROVIDER_SITE_OTHER): Payer: Self-pay

## 2020-07-22 ENCOUNTER — Other Ambulatory Visit: Payer: Self-pay

## 2020-07-22 DIAGNOSIS — Z111 Encounter for screening for respiratory tuberculosis: Secondary | ICD-10-CM

## 2020-07-25 ENCOUNTER — Ambulatory Visit (INDEPENDENT_AMBULATORY_CARE_PROVIDER_SITE_OTHER): Payer: Medicaid Other

## 2020-07-25 ENCOUNTER — Other Ambulatory Visit: Payer: Self-pay

## 2020-07-25 DIAGNOSIS — Z111 Encounter for screening for respiratory tuberculosis: Secondary | ICD-10-CM

## 2020-07-25 LAB — TB SKIN TEST
Induration: 0 mm
TB Skin Test: NEGATIVE

## 2020-07-31 ENCOUNTER — Other Ambulatory Visit: Payer: Self-pay

## 2020-09-06 ENCOUNTER — Other Ambulatory Visit: Payer: Self-pay | Admitting: Urology

## 2020-09-06 DIAGNOSIS — D3000 Benign neoplasm of unspecified kidney: Secondary | ICD-10-CM

## 2020-09-09 ENCOUNTER — Encounter (INDEPENDENT_AMBULATORY_CARE_PROVIDER_SITE_OTHER): Payer: Self-pay | Admitting: Primary Care

## 2020-09-10 ENCOUNTER — Other Ambulatory Visit: Payer: Self-pay

## 2020-09-10 ENCOUNTER — Ambulatory Visit (HOSPITAL_COMMUNITY)
Admission: RE | Admit: 2020-09-10 | Discharge: 2020-09-10 | Disposition: A | Discharge status: Home / Self Care | Payer: Medicaid Other | Source: Ambulatory Visit | Attending: Urology | Admitting: Urology

## 2020-09-10 DIAGNOSIS — D3000 Benign neoplasm of unspecified kidney: Secondary | ICD-10-CM | POA: Insufficient documentation

## 2020-09-10 LAB — POCT I-STAT CREATININE: Creatinine, Ser: 0.9 mg/dL (ref 0.44–1.00)

## 2020-09-10 IMAGING — CT CT ABD-PEL WO/W CM
3 of 13 series · 11 of 46 positions shown, 17 images · IV contrast (omnipaque)
Comparison: CT [DATE]

CLINICAL DATA: Right renal mass, consistent with RCC on imaging. No
treatments.

EXAM:
CT ABDOMEN AND PELVIS WITHOUT AND WITH CONTRAST
TECHNIQUE: Multidetector CT imaging of the abdomen and pelvis was performed
following the standard protocol before and following the bolus
administration of intravenous contrast.
CONTRAST:  100mL OMNIPAQUE IOHEXOL 300 MG/ML  SOLN

[Series 6: axial arterial · axial · arterial · 0.72mm/px · z∈[-554,-428]mm · 3 of 85 slices shown]
[im 22/85  soft-tissue]
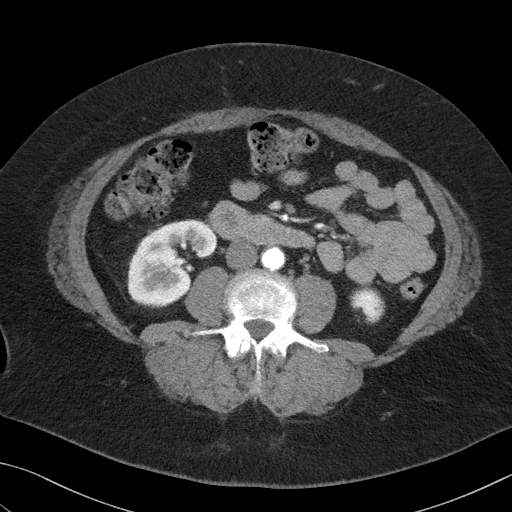
[im 43/85  soft-tissue]
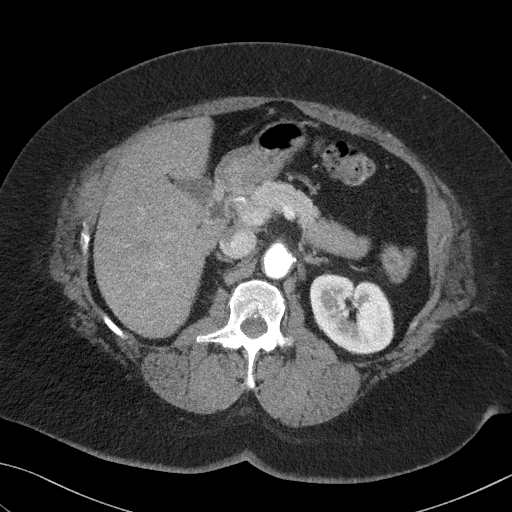
[im 64/85  soft-tissue]
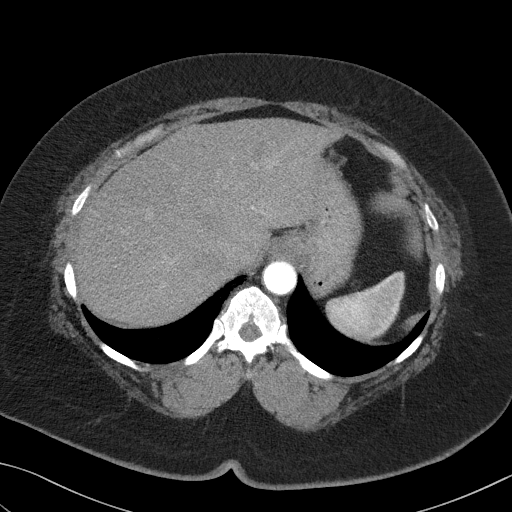

[Series 11: axial nephro · axial · 0.78mm/px · z∈[-743,-428]mm · 6 of 148 slices shown, 11 images]
[im 22/148  soft-tissue]
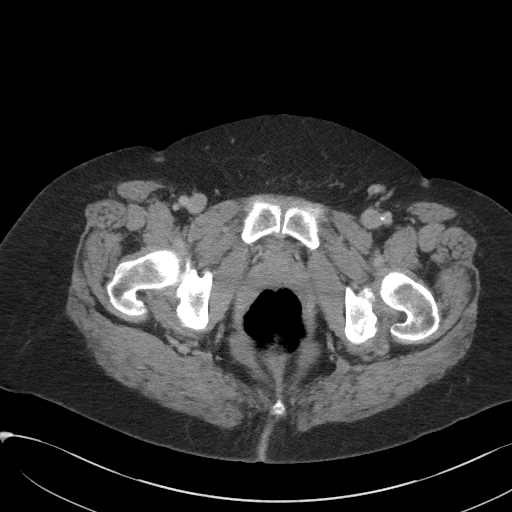
[im 22/148  bone]
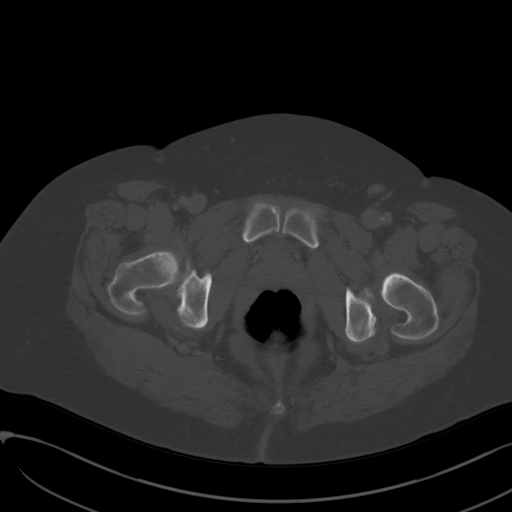
[im 43/148  soft-tissue]
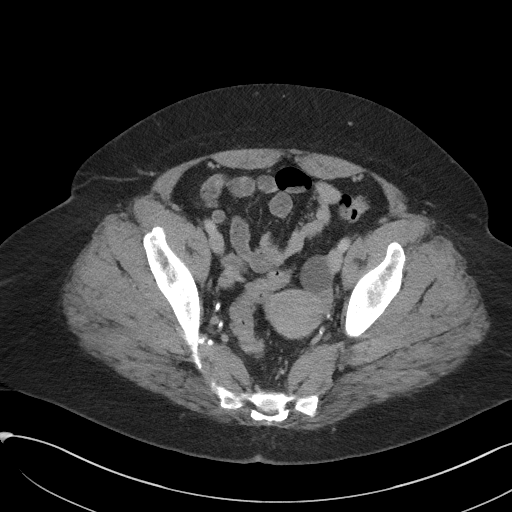
[im 64/148  soft-tissue]
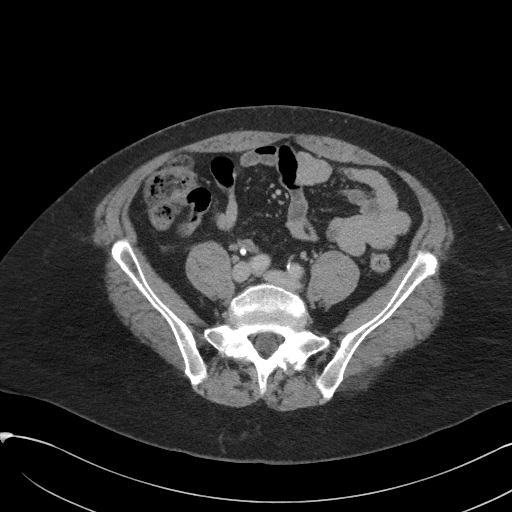
[im 64/148  lung]
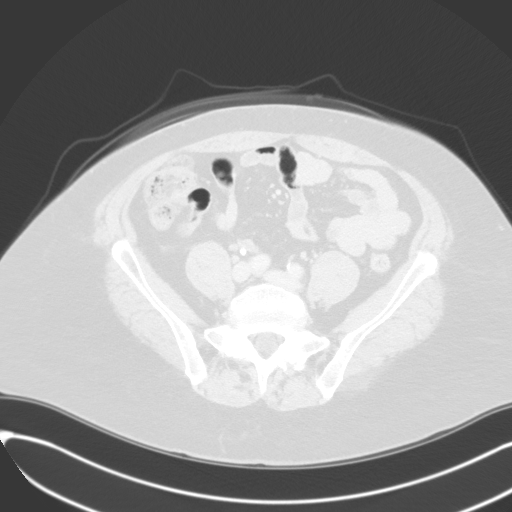
[im 85/148  soft-tissue]
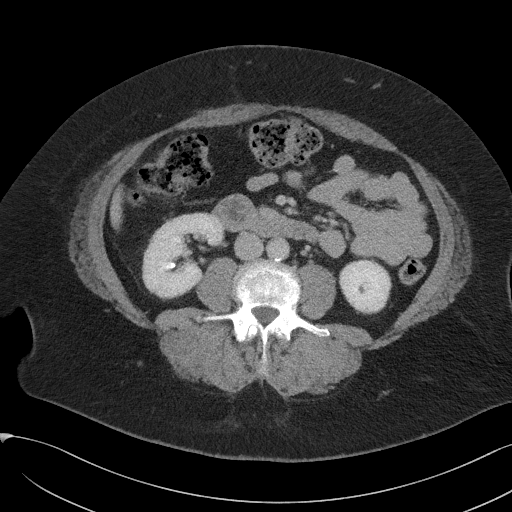
[im 85/148  lung]
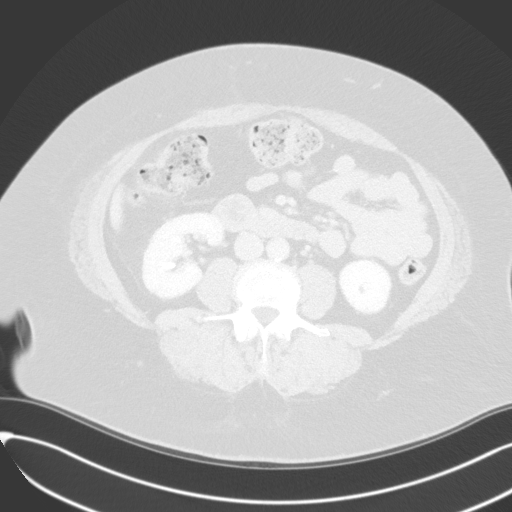
[im 106/148  soft-tissue]
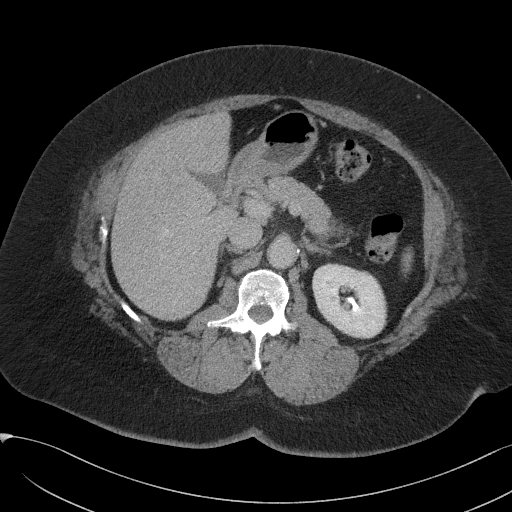
[im 106/148  lung]
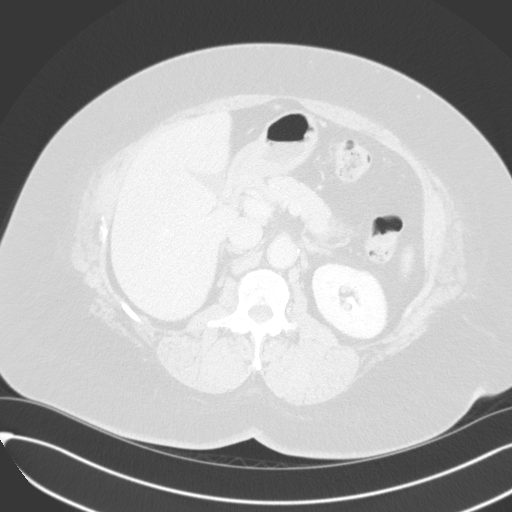
[im 127/148  soft-tissue]
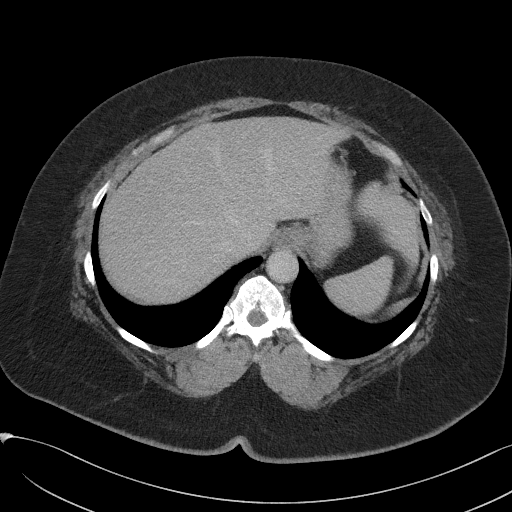
[im 127/148  lung]
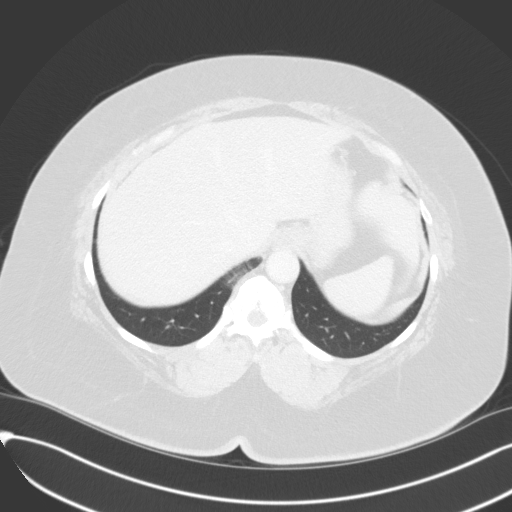

[Series 14: coronal venous · coronal · portal-venous · 0.81mm/px · 2 of 113 slices shown, 3 images]
[im 38/113  soft-tissue]
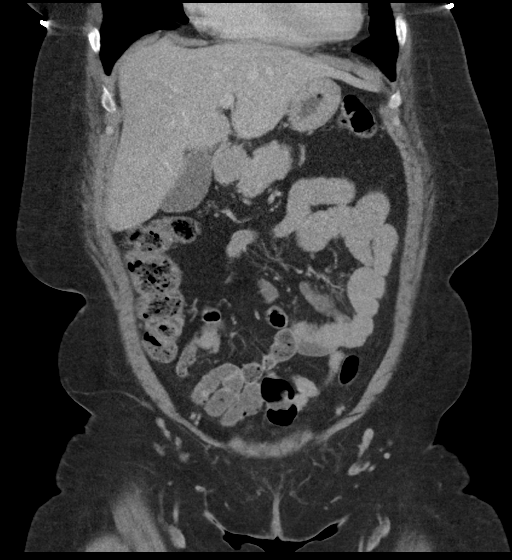
[im 38/113  bone]
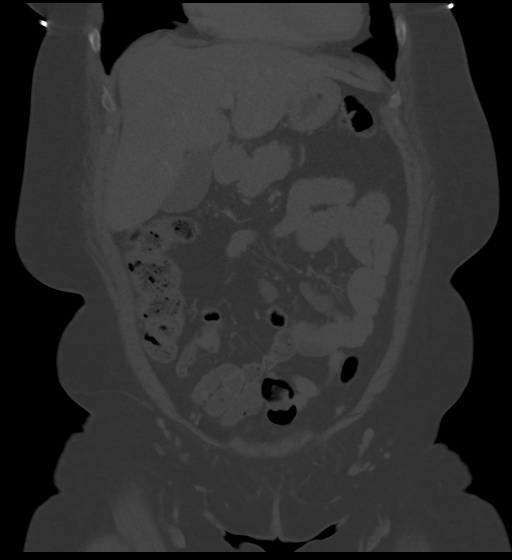
[im 75/113  soft-tissue]
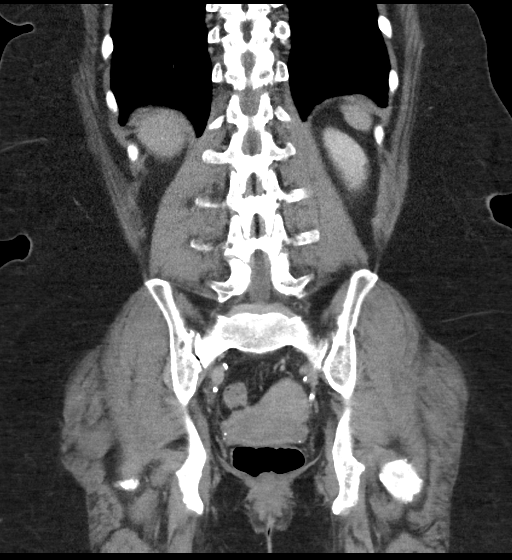

[11 of 46 positions shown; findings below may reference images not displayed]

FINDINGS: Lower chest: No acute abnormality.

Hepatobiliary: No suspicious hepatic lesion. Gallbladder is
unremarkable. No biliary ductal dilation.

Pancreas: Unremarkable. No pancreatic ductal dilatation or
surrounding inflammatory changes.

Spleen: Normal in size without focal abnormality.

Adrenals/Urinary Tract: Bilateral adrenal glands are unremarkable.

No hydronephrosis.  Left kidney is unremarkable.

Solid enhancing 2.6 x 2.5 x 1.9 cm lesion lower pole the right
kidney previously measuring 2.6 x 2.4 x 1.7 cm.

Urinary bladder is unremarkable for degree of distension.

Stomach/Bowel: Stomach is unremarkable. Normal positioning of the
duodenum/ligament of [place]. No pathologic dilation of small bowel.
The appendix and terminal ileum are grossly unremarkable. No
suspicious colonic wall thickening or mass like lesions visualized.

Vascular/Lymphatic: Aortic atherosclerosis without aneurysmal
dilation. Renal veins are patent without intraluminal filling
defect. No pathologically enlarged abdominal or pelvic lymph nodes.

Reproductive: Uterus and right adnexa are unremarkable.
Benign-appearing 3.1 cm cyst in left ovary.

Other: No abdominopelvic ascites.

Musculoskeletal: Multilevel degenerative changes spine. No
aggressive lytic or blastic lesion of bone.
IMPRESSION: 1. Solid enhancing 2.6 cm lesion lower pole the right kidney,
consistent with renal cell carcinoma. No evidence of tumor in vein
or metastatic disease in the abdomen or pelvis.
2. Simple appearing 3.1 cm cyst in left ovary. Recommend follow-up
US in 6-12 months. Note: This recommendation does not apply to
premenarchal patients and to those with increased risk (genetic,
family history, elevated tumor markers or other high-risk factors)
of ovarian cancer. Reference: JACR [DATE]):[id number]
3.  Aortic Atherosclerosis ([H1]-[H1]).

## 2020-09-10 MED ORDER — IOHEXOL 300 MG/ML  SOLN
100.0000 mL | Freq: Once | INTRAMUSCULAR | Status: AC | PRN
  Administered 2020-09-10: 09:00:00 100 mL via INTRAVENOUS

## 2020-09-18 ENCOUNTER — Encounter (INDEPENDENT_AMBULATORY_CARE_PROVIDER_SITE_OTHER): Payer: Self-pay | Admitting: Primary Care

## 2020-09-18 ENCOUNTER — Telehealth: Payer: Self-pay

## 2020-09-18 ENCOUNTER — Ambulatory Visit (INDEPENDENT_AMBULATORY_CARE_PROVIDER_SITE_OTHER): Payer: Self-pay | Admitting: Primary Care

## 2020-09-18 ENCOUNTER — Other Ambulatory Visit: Payer: Self-pay

## 2020-09-18 VITALS — BP 153/83 | HR 70 | Temp 97.5°F | Ht 65.0 in | Wt 224.2 lb

## 2020-09-18 DIAGNOSIS — E119 Type 2 diabetes mellitus without complications: Secondary | ICD-10-CM

## 2020-09-18 DIAGNOSIS — I10 Essential (primary) hypertension: Secondary | ICD-10-CM

## 2020-09-18 DIAGNOSIS — Z794 Long term (current) use of insulin: Secondary | ICD-10-CM

## 2020-09-18 DIAGNOSIS — E782 Mixed hyperlipidemia: Secondary | ICD-10-CM

## 2020-09-18 LAB — POCT GLYCOSYLATED HEMOGLOBIN (HGB A1C): Hemoglobin A1C: 12.1 % — AB (ref 4.0–5.6)

## 2020-09-18 MED ORDER — HYDROCHLOROTHIAZIDE 12.5 MG PO TABS
25.0000 mg | ORAL_TABLET | Freq: Every day | ORAL | 3 refills | Status: DC
  Filled 2020-09-18: qty 30, 30d supply, fill #0
  Filled 2020-09-19: qty 60, 30d supply, fill #0
  Filled 2020-12-24: qty 180, 90d supply, fill #1

## 2020-09-18 MED ORDER — AMLODIPINE BESYLATE 10 MG PO TABS
ORAL_TABLET | Freq: Every day | ORAL | 1 refills | Status: DC
  Filled 2020-09-18: qty 30, 30d supply, fill #0
  Filled 2020-12-06 – 2020-12-24 (×2): qty 30, 30d supply, fill #1

## 2020-09-18 MED ORDER — DULAGLUTIDE 0.75 MG/0.5ML ~~LOC~~ SOAJ
0.7500 mg | SUBCUTANEOUS | 1 refills | Status: DC
  Filled 2020-09-18: qty 4, 56d supply, fill #0

## 2020-09-18 MED ORDER — CARVEDILOL 12.5 MG PO TABS
12.5000 mg | ORAL_TABLET | Freq: Two times a day (BID) | ORAL | 3 refills | Status: DC
  Filled 2020-09-18: qty 60, 30d supply, fill #0
  Filled 2020-09-19: qty 180, 90d supply, fill #0
  Filled 2020-12-06 – 2020-12-24 (×2): qty 180, 90d supply, fill #1

## 2020-09-18 MED ORDER — LISINOPRIL 20 MG PO TABS
ORAL_TABLET | Freq: Every day | ORAL | 1 refills | Status: DC
  Filled 2020-09-18: qty 30, 30d supply, fill #0
  Filled 2020-12-06: qty 30, 30d supply, fill #1
  Filled 2020-12-24: qty 90, 90d supply, fill #1

## 2020-09-18 MED ORDER — PRAVASTATIN SODIUM 40 MG PO TABS
40.0000 mg | ORAL_TABLET | Freq: Every day | ORAL | 1 refills | Status: DC
  Filled 2020-09-18: qty 30, 30d supply, fill #0
  Filled 2020-09-19: qty 90, 90d supply, fill #0
  Filled 2020-12-06 – 2020-12-24 (×2): qty 90, 90d supply, fill #1

## 2020-09-18 NOTE — Patient Instructions (Signed)
Diabetes Mellitus Action Plan °Following a diabetes action plan is a way for you to manage your diabetes (diabetes mellitus) symptoms. The plan is color-coded to help you understand what actions you need to take based on any symptoms you are having. °If you have symptoms in the red zone, you need medical care right away. °If you have symptoms in the yellow zone, you are having problems. °If you have symptoms in the green zone, you are doing well. °Learning about and understanding diabetes can take time. Follow the plan that you develop with your health care provider. Know the target range for your blood sugar (glucose) level, and review your treatment plan with your health care provider at each visit. °The target range for my blood sugar level is __________________________ mg/dL. °Red zone °Get medical help right away if you have any of the following symptoms: °A blood sugar test result that is below 54 mg/dL (3 mmol/L). °A blood sugar test result that is at or above 240 mg/dL (13.3 mmol/L) for 2 days in a row. °Confusion or trouble thinking clearly. °Difficulty breathing. °Sickness or a fever for 2 or more days that is not getting better. °Moderate or large ketone levels in your urine. °Feeling tired or having no energy. °If you have any red zone symptoms, do not wait to see if the symptoms will go away. Get medical help right away. Call your local emergency services (911 in the [place] Do not drive yourself to the hospital. °If you have severely low blood sugar (severe hypoglycemia) and you cannot eat or drink, you may need glucagon. Make sure a family member or close friend knows how to check your blood sugar and how to give you glucagon. You may need to be treated in a hospital for this condition. °Yellow zone °If you have any of the following symptoms, your diabetes is not under control and you may need to make some changes: °A blood sugar test result that is at or above 240 mg/dL (13.3 mmol/L) for 2 days in a  row. °Blood sugar test results that are below 70 mg/dL (3.9 mmol/L). °Other symptoms of hypoglycemia, such as: °Shaking or feeling light-headed. °Confusion or irritability. °Feeling hungry. °Having a fast heartbeat. °If you have any yellow zone symptoms: °Treat your hypoglycemia by eating or drinking 15 grams of a rapid-acting carbohydrate. Follow the 15:15 rule: °Take 15 grams of a rapid-acting carbohydrate, such as: °1 tube of glucose gel. °4 glucose pills. °4 oz (120 mL) of fruit juice. °4 oz (120 mL) of regular (not diet) soda. °Check your blood sugar 15 minutes after you take the carbohydrate. °If the repeat blood sugar test is still at or below 70 mg/dL (3.9 mmol/L), take 15 grams of a carbohydrate again. °If your blood sugar does not increase above 70 mg/dL (3.9 mmol/L) after 3 tries, get medical help right away. °After your blood sugar returns to normal, eat a meal or a snack within 1 hour. °Keep taking your daily medicines as told by your health care provider. °Check your blood sugar more often than you normally would. °Write down your results. °Call your health care provider if you have trouble keeping your blood sugar in your target range. ° °Green zone °These signs mean you are doing well and you can continue what you are doing to manage your diabetes: °Your blood sugar is within your personal target range. For most people, a blood sugar level before a meal (preprandial) should be 80-130 mg/dL (4.4-7.2 mmol/L). °You feel   well, and you are able to do daily activities. °If you are in the green zone, continue to manage your diabetes as told by your health care provider. To do this: °Eat a healthy diet. °Exercise regularly. °Check your blood sugar as told by your health care provider. °Take your medicines as told by your health care provider. ° °Where to find more information °American Diabetes Association (ADA): diabetes.org °Association of Diabetes Care & Education Specialists (ADCES):  diabeteseducator.org °Summary °Following a diabetes action plan is a way for you to manage your diabetes symptoms. The plan is color-coded to help you understand what actions you need to take based on any symptoms you are having. °Follow the plan that you develop with your health care provider. Make sure you know your personal target blood sugar level. °Review your treatment plan with your health care provider at each visit. °This information is not intended to replace advice given to you by your health care provider. Make sure you discuss any questions you have with your health care provider. °Document Revised: 09/07/2019 Document Reviewed: 09/07/2019 °Elsevier Patient Education © 2022 Elsevier Inc. ° °

## 2020-09-18 NOTE — Progress Notes (Signed)
Hannah Dougherty is a 50 y.o. female who presents for an follow up  evaluation of Type 2 diabetes mellitus.  Current symptoms/problems include polyuria and visual disturbances and have been worsening. Symptoms have been present for 2 months.  Current diabetic medications include none. Lost job when in hospital and insurance unable to get any medication  The patient was initially diagnosed with Type 2 diabetes mellitus based on the following criteria:  ADA guidelines .  Current monitoring regimen: home blood tests - 3 times weekly Home blood sugar records: fasting range: 170-220 Any episodes of hypoglycemia? no  Known diabetic complications: none Cardiovascular risk factors: diabetes mellitus, hypertension, and obesity (BMI >= 30 kg/m2) Eye exam current (within one year): no Weight trend: stable Prior visit with CDE: yes -  Current diet: in general, an "unhealthy" diet Current exercise: walking Medication Compliance?  No due to un affordability    Is She on ACE inhibitor or angiotensin II receptor blocker?  Prescribed ACE  Review of Systems  Psychiatric/Behavioral:  Positive for depression.   All other systems reviewed and are negative.  Objective:    BP (!) 153/83 (BP Location: Right Arm, Patient Position: Sitting, Cuff Size: Large)   Pulse 70   Temp (!) 97.5 F (36.4 C) (Temporal)   Ht 5\' 5"  (1.651 m)   Wt 224 lb 3.2 oz (101.7 kg)   LMP 10/28/2016 (Within Weeks)   SpO2 97%   BMI 37.31 kg/m   Physical Exam Vitals reviewed.  Constitutional:      Appearance: She is obese.  HENT:     Head: Normocephalic.     Right Ear: External ear normal.     Left Ear: External ear normal.     Nose: Nose normal.  Eyes:     Extraocular Movements: Extraocular movements intact.  Cardiovascular:     Rate and Rhythm: Normal rate and regular rhythm.  Pulmonary:     Effort: Pulmonary effort is normal.     Breath sounds: Normal breath sounds.  Abdominal:      General: Bowel sounds are normal. There is distension.     Palpations: Abdomen is soft.  Musculoskeletal:        General: Normal range of motion.     Cervical back: Normal range of motion.  Skin:    General: Skin is warm and dry.  Neurological:     Mental Status: She is alert and oriented to person, place, and time.  Psychiatric:        Mood and Affect: Mood normal.        Behavior: Behavior normal.        Thought Content: Thought content normal.        Judgment: Judgment normal.     Lab Review Glucose, Bld (mg/dL)  Date Value  02/18/2020 218 (H)  02/17/2020 184 (H)  02/16/2020 269 (H)   CO2 (mmol/L)  Date Value  02/18/2020 23  02/17/2020 22  02/16/2020 25   BUN (mg/dL)  Date Value  02/18/2020 16  02/17/2020 17  02/16/2020 12  06/28/2019 17  12/14/2018 14   Creatinine, Ser (mg/dL)  Date Value  09/10/2020 0.90  02/18/2020 0.92  02/17/2020 0.87      Assessment:    Diabetes Mellitus type II, under poor control.   Beckie Hannah Dougherty was seen today for diabetes.  Diagnoses and all orders for this visit:  Type 2 diabetes mellitus without complication, with long-term current use of insulin ([place]) -  HgB A1c      Plan:    1.  Rx changes: none  2.  Education: Reviewed 'ABCs' of diabetes management (respective goals in parentheses):  A1C (<7), blood pressure (<130/80), and cholesterol (LDL <100). Counseled Patient on the risk factors of co- morbidities with uncontrol diabetes  Complications -diabetic retinopathy, (close your eyes ? What do you see nothing) nephropathy decrease in kidney function- can lead to dialysis-on a machine 3 days a week to filter your kidney, neuropathy- numbness and tinging in your hands and feet,  increase risk of heart attack and stroke, and amputation due to decrease wound healing and circulation. Decrease your risk by taking medication daily as prescribed, monitor carbohydrates- foods that are high in carbohydrates are the following rice,  potatoes, breads, sugars, and pastas.  Reduction in the intake (eating) will assist in lowering your blood sugars. Exercise daily at least 30 minutes daily.   3. CHO counting diet discussed.  Reviewed CHO amount in various foods and how to read nutrition labels.  Discussed recommended serving sizes.   4.  Recommend check BG 3  times a day  5. Recommended increase physical activity - goal is 150 minutes per week  Hannah Dougherty was seen today for diabetes.  Diagnoses and all orders for this visit:  Type 2 diabetes mellitus without complication, with long-term current use of insulin (HCC) -     HgB A1c -     Dulaglutide 0.75 MG/0.5ML SOPN; INJECT 0.75 MG INTO THE SKIN ONCE A WEEK.  Essential hypertension Counseled on blood pressure goal of less than 130/80, low-sodium, DASH diet, medication compliance, 150 minutes of moderate intensity exercise per week. Discussed medication compliance, adverse effects.  -     amLODipine (NORVASC) 10 MG tablet; TAKE 1 TABLET (10 MG TOTAL) BY MOUTH DAILY. -     carvedilol (COREG) 12.5 MG tablet; Take 1 tablet (12.5 mg total) by mouth 2 (two) times daily with a meal. -     hydrochlorothiazide (HYDRODIURIL) 25 MG tablet; Take 1 tablet (25 mg total) by mouth daily. -     lisinopril (ZESTRIL) 20 MG tablet; TAKE 1 TABLET (20 MG TOTAL) BY MOUTH DAILY.  Mixed hyperlipidemia Elevate cholesterol and this can lead to heart attack and stroke. To lower your number you can decrease your fatty foods, red meat, cheese, milk and increase fiber like whole grains and veggies.  -     pravastatin (PRAVACHOL) 40 MG tablet; Take 1 tablet (40 mg total) by mouth daily.    This note has been created with Surveyor, quantity. Any transcriptional errors are unintentional.   Hannah Perna, NP 09/18/2020, 4:30 PM

## 2020-09-18 NOTE — Telephone Encounter (Signed)
Call received from Juluis Mire, NP who was with the patient.  She explained that the patient has lost insurance and is having difficulty paying for medications and has been out of them for a few months.   Sharyn Lull will re-order meds and have them sent to [facility].  Informed the patient that she is eligible for a one time free fill for most medications at [place]. Also instructed the patient to speak with Cheryle Horsfall, [facility] about patient assistance programs that would make the medications more affordable for her.

## 2020-09-19 ENCOUNTER — Other Ambulatory Visit: Payer: Self-pay

## 2020-09-23 ENCOUNTER — Ambulatory Visit: Payer: Medicaid Other | Admitting: Pharmacist

## 2020-09-30 ENCOUNTER — Ambulatory Visit: Payer: Medicaid Other | Admitting: Pharmacist

## 2020-12-06 ENCOUNTER — Other Ambulatory Visit: Payer: Self-pay

## 2020-12-06 ENCOUNTER — Telehealth (INDEPENDENT_AMBULATORY_CARE_PROVIDER_SITE_OTHER): Payer: Self-pay | Admitting: Primary Care

## 2020-12-06 DIAGNOSIS — Z794 Long term (current) use of insulin: Secondary | ICD-10-CM

## 2020-12-06 NOTE — Telephone Encounter (Signed)
Pt is calling to ask Sharyn Lull if she can send in the Trulicity today. Pt is scheduled to take the shot today at 10pm. And Pt does not have any. She ran out last Friday. Please advise

## 2020-12-06 NOTE — Telephone Encounter (Signed)
Sent to PCP ?

## 2020-12-08 MED ORDER — TRULICITY 0.75 MG/0.5ML ~~LOC~~ SOAJ
0.7500 mg | SUBCUTANEOUS | 1 refills | Status: DC
  Filled 2020-12-08 – 2020-12-24 (×2): qty 4, 56d supply, fill #0

## 2020-12-09 ENCOUNTER — Other Ambulatory Visit: Payer: Self-pay

## 2020-12-10 ENCOUNTER — Other Ambulatory Visit: Payer: Self-pay

## 2020-12-13 ENCOUNTER — Other Ambulatory Visit: Payer: Self-pay

## 2020-12-16 ENCOUNTER — Other Ambulatory Visit: Payer: Self-pay

## 2020-12-23 ENCOUNTER — Ambulatory Visit (INDEPENDENT_AMBULATORY_CARE_PROVIDER_SITE_OTHER): Payer: Medicaid Other | Admitting: Primary Care

## 2020-12-23 NOTE — Telephone Encounter (Signed)
Patient is aware that trulicity was sent to pharmacy on 9/25.

## 2020-12-23 NOTE — Telephone Encounter (Signed)
Pt called stating that she never received a call from pharmacy to get this refilled and is requesting to have PCP send this back in. Please advise.

## 2020-12-24 ENCOUNTER — Other Ambulatory Visit: Payer: Self-pay

## 2020-12-25 ENCOUNTER — Other Ambulatory Visit: Payer: Self-pay

## 2020-12-31 ENCOUNTER — Other Ambulatory Visit: Payer: Self-pay

## 2021-01-01 ENCOUNTER — Other Ambulatory Visit: Payer: Self-pay

## 2021-01-07 ENCOUNTER — Other Ambulatory Visit: Payer: Self-pay

## 2021-01-07 ENCOUNTER — Encounter (INDEPENDENT_AMBULATORY_CARE_PROVIDER_SITE_OTHER): Payer: Self-pay | Admitting: Primary Care

## 2021-01-07 ENCOUNTER — Ambulatory Visit (INDEPENDENT_AMBULATORY_CARE_PROVIDER_SITE_OTHER): Payer: Self-pay | Admitting: Primary Care

## 2021-01-07 VITALS — BP 180/92 | HR 70 | Temp 97.9°F | Resp 16 | Ht 65.0 in | Wt 229.0 lb

## 2021-01-07 DIAGNOSIS — E785 Hyperlipidemia, unspecified: Secondary | ICD-10-CM

## 2021-01-07 DIAGNOSIS — I10 Essential (primary) hypertension: Secondary | ICD-10-CM

## 2021-01-07 DIAGNOSIS — E119 Type 2 diabetes mellitus without complications: Secondary | ICD-10-CM

## 2021-01-07 DIAGNOSIS — E1169 Type 2 diabetes mellitus with other specified complication: Secondary | ICD-10-CM

## 2021-01-07 DIAGNOSIS — F17200 Nicotine dependence, unspecified, uncomplicated: Secondary | ICD-10-CM

## 2021-01-07 DIAGNOSIS — F17210 Nicotine dependence, cigarettes, uncomplicated: Secondary | ICD-10-CM

## 2021-01-07 DIAGNOSIS — Z794 Long term (current) use of insulin: Secondary | ICD-10-CM

## 2021-01-07 LAB — POCT GLYCOSYLATED HEMOGLOBIN (HGB A1C): HbA1c, POC (controlled diabetic range): 11.8 % — AB (ref 0.0–7.0)

## 2021-01-07 MED ORDER — METFORMIN HCL 1000 MG PO TABS
1000.0000 mg | ORAL_TABLET | Freq: Two times a day (BID) | ORAL | 3 refills | Status: DC
Start: 2021-01-07 — End: 2021-06-13
  Filled 2021-01-07: qty 180, 90d supply, fill #0

## 2021-01-07 MED ORDER — HYDROCHLOROTHIAZIDE 25 MG PO TABS
25.0000 mg | ORAL_TABLET | Freq: Every day | ORAL | 3 refills | Status: DC
  Filled 2021-01-07: qty 90, 90d supply, fill #0

## 2021-01-07 MED ORDER — AMLODIPINE BESYLATE 10 MG PO TABS
ORAL_TABLET | Freq: Every day | ORAL | 1 refills | Status: DC
  Filled 2021-01-07: qty 90, 90d supply, fill #0

## 2021-01-07 MED ORDER — TRULICITY 1.5 MG/0.5ML ~~LOC~~ SOAJ
1.5000 mg | SUBCUTANEOUS | 3 refills | Status: DC
  Filled 2021-01-07 – 2021-01-28 (×3): qty 2, 28d supply, fill #0

## 2021-01-07 MED ORDER — CARVEDILOL 12.5 MG PO TABS
12.5000 mg | ORAL_TABLET | Freq: Two times a day (BID) | ORAL | 3 refills | Status: DC
  Filled 2021-01-07: qty 180, 90d supply, fill #0

## 2021-01-07 MED ORDER — NICOTINE 7 MG/24HR TD PT24
7.0000 mg | MEDICATED_PATCH | Freq: Every day | TRANSDERMAL | 0 refills | Status: DC
  Filled 2021-01-07: qty 28, 28d supply, fill #0

## 2021-01-07 MED ORDER — NICOTINE 14 MG/24HR TD PT24
14.0000 mg | MEDICATED_PATCH | Freq: Every day | TRANSDERMAL | 0 refills | Status: DC
  Filled 2021-01-07: qty 28, 28d supply, fill #0

## 2021-01-07 MED ORDER — GLIMEPIRIDE 4 MG PO TABS
4.0000 mg | ORAL_TABLET | Freq: Every day | ORAL | 3 refills | Status: DC
Start: 2021-01-07 — End: 2021-06-13
  Filled 2021-01-07: qty 90, 90d supply, fill #0

## 2021-01-07 MED ORDER — NICOTINE 21 MG/24HR TD PT24
21.0000 mg | MEDICATED_PATCH | Freq: Every day | TRANSDERMAL | 0 refills | Status: DC
  Filled 2021-01-07: qty 28, 28d supply, fill #0

## 2021-01-07 MED ORDER — LISINOPRIL 20 MG PO TABS
ORAL_TABLET | Freq: Every day | ORAL | 1 refills | Status: DC
  Filled 2021-01-07: qty 90, 90d supply, fill #0

## 2021-01-07 NOTE — Progress Notes (Signed)
[facility]   Subjective:  Patient ID: Hannah Dougherty, female    DOB: 1971-02-13  Age: 50 y.o. MRN: [id number]  CC: No chief complaint on file.   HPI Hannah Dougherty presents forFollow-up of diabetes. Patient does not check blood sugar at home.  Due to financial reasons patient has been unable to afford medications  Compliant with meds -no Checking CBGs? No  Fasting avg -   Postprandial average -  Exercising regularly? - Yes Watching carbohydrate intake? - Yes Neuropathy ? - No Hypoglycemic events - No  - Recovers with :   Pertinent ROS:  Polyuria - No Polydipsia - No Vision problems - No  Medications as noted below. Taking them regularly without complication/adverse reaction being reported today.   History Hannah Dougherty has a past medical history of Diabetes mellitus without complication ([place]) and Hypertension.   She has a past surgical history that includes Tubal ligation and Vocal cord lateralization, endoscopic approach w/ MLB.   Her family history includes Breast cancer in her maternal grandmother; Diabetes in her father, maternal grandmother, and mother; Hypertension in her father, maternal grandmother, and mother; Kidney disease in her mother.She reports that she has been smoking cigarettes. She has been smoking an average of .5 packs per day. She has never used smokeless tobacco. She reports current alcohol use. She reports current drug use. Drug: Marijuana.  Current Outpatient Medications on File Prior to Visit  Medication Sig Dispense Refill   amLODipine (NORVASC) 10 MG tablet TAKE 1 TABLET (10 MG TOTAL) BY MOUTH DAILY. 90 tablet 1   carvedilol (COREG) 12.5 MG tablet Take 1 tablet (12.5 mg total) by mouth 2 (two) times daily with a meal. 180 tablet 3   Dulaglutide (TRULICITY) 2.99 BZ/1.6RC SOPN INJECT 0.75 MG INTO THE SKIN ONCE A WEEK. 2 mL 1   glucose blood (RELION CONFIRM/MICRO TEST) test strip Use as instructed (Patient not taking: Reported on 09/18/2020) 100  each 0   hydrochlorothiazide (HYDRODIURIL) 12.5 MG tablet Take 2 tablets (25 mg total) by mouth daily. 180 tablet 3   lisinopril (ZESTRIL) 20 MG tablet TAKE 1 TABLET (20 MG TOTAL) BY MOUTH DAILY. 90 tablet 1   pravastatin (PRAVACHOL) 40 MG tablet Take 1 tablet (40 mg total) by mouth daily. 90 tablet 1   ReliOn Lancet Devices 30G MISC 1 each by Does not apply route 3 (three) times daily. (Patient not taking: Reported on 09/18/2020) 100 each 0   No current facility-administered medications on file prior to visit.    ROS Comprehensive review of systems pertinent negative and positive noted in HPI  Objective:  LMP 10/28/2016 (Within Weeks)   BP Readings from Last 3 Encounters:  09/18/20 (!) 153/83  06/19/20 (!) 174/96  02/18/20 107/75    Wt Readings from Last 3 Encounters:  09/18/20 224 lb 3.2 oz (101.7 kg)  06/19/20 220 lb 12.8 oz (100.2 kg)  02/16/20 190 lb (86.2 kg)    Physical Exam Physical exam: General: Vital signs reviewed.  Patient is well-developed and well-nourished, obese female in no acute distress and cooperative with exam. Head: Normocephalic and atraumatic. Eyes: EOMI, conjunctivae normal, no scleral icterus. Neck: Supple, trachea midline, normal ROM, no JVD, masses, thyromegaly, or carotid bruit present. Cardiovascular: RRR, S1 normal, S2 normal, no murmurs, gallops, or rubs. Pulmonary/Chest: Clear to auscultation bilaterally, no wheezes, rales, or rhonchi. Abdominal: Soft, non-tender, non-distended, BS +, no masses, organomegaly, or guarding present. Musculoskeletal: No joint deformities, erythema, or stiffness, ROM full and nontender. Extremities: No lower extremity  edema bilaterally,  pulses symmetric and intact bilaterally. No cyanosis or clubbing. Neurological: A&O x3, Strength is normal Skin: Warm, dry and intact. No rashes or erythema. Psychiatric: Normal mood and affect. speech and behavior is normal. Cognition and memory are normal.    Lab Results   Component Value Date   HGBA1C 12.1 (A) 09/18/2020   HGBA1C 10.0 (A) 06/19/2020   HGBA1C 7.9 (A) 01/15/2020    Lab Results  Component Value Date   WBC 9.0 02/16/2020   HGB 13.9 02/16/2020   HCT 41.0 02/16/2020   PLT 302 02/16/2020   GLUCOSE 218 (H) 02/18/2020   CHOL 221 (H) 01/15/2020   TRIG 82 01/15/2020   HDL 61 01/15/2020   LDLCALC 146 (H) 01/15/2020   ALT 12 02/16/2020   AST 17 02/16/2020   NA 131 (L) 02/18/2020   K 3.9 02/18/2020   CL 95 (L) 02/18/2020   CREATININE 0.90 09/10/2020   BUN 16 02/18/2020   CO2 23 02/18/2020   TSH 0.371 (L) 01/15/2020   HGBA1C 12.1 (A) 09/18/2020     Assessment & Plan:  Diagnoses and all orders for this visit:  Type 2 diabetes mellitus without complication, with long-term current use of insulin (HCC) A1c is unacceptable increase Trulicity to 1.5 weekly and added Amaryl 4 mg daily. Discussed  co- morbidities with uncontrol diabetes  Complications -diabetic retinopathy, (close your eyes ? What do you see nothing) nephropathy decrease in kidney function- can lead to dialysis-on a machine 3 days a week to filter your kidney, neuropathy- numbness and tinging in your hands and feet,  increase risk of heart attack and stroke, and amputation due to decrease wound healing and circulation. Decrease your risk by taking medication daily as prescribed, monitor carbohydrates- foods that are high in carbohydrates are the following rice, potatoes, breads, sugars, and pastas.  Reduction in the intake (eating) will assist in lowering your blood sugars. Exercise daily at least 30 minutes daily.  -     Cancel: Glucose (CBG) -     HgB A1c -     CBC with Differential -     Dulaglutide (TRULICITY) 1.5 KX/3.8HW SOPN; Inject 1.5 mg into the skin once a week. -     glimepiride (AMARYL) 4 MG tablet; Take 1 tablet (4 mg total) by mouth daily before breakfast. -     metFORMIN (GLUCOPHAGE) 1000 MG tablet; Take 1 tablet (1,000 mg total) by mouth 2 (two) times daily with a  meal.  Hypertension, unspecified type Counseled on blood pressure goal of less than 130/80, low-sodium, DASH diet, medication compliance, 150 minutes of moderate intensity exercise per week. Discussed medication compliance, adverse effects.  -     CMP14+EGFR -     lisinopril (ZESTRIL) 20 MG tablet; TAKE 1 TABLET (20 MG TOTAL) BY MOUTH DAILY. -     carvedilol (COREG) 12.5 MG tablet; Take 1 tablet (12.5 mg total) by mouth 2 (two) times daily with a meal. -     amLODipine (NORVASC) 10 MG tablet; TAKE 1 TABLET (10 MG TOTAL) BY MOUTH DAILY. -     hydrochlorothiazide (HYDRODIURIL) 25 MG tablet; Take 1 tablet (25 mg total) by mouth daily.  Hyperlipidemia associated with type 2 diabetes mellitus (HCC)  Healthy lifestyle diet of fruits vegetables fish nuts whole grains and low saturated fat . Foods high in cholesterol or liver, fatty meats,cheese, butter avocados, nuts and seeds, chocolate and fried foods. -     Lipid Panel  Tobacco dependence -  nicotine (NICODERM CQ) 21 mg/24hr patch; Place 1 patch (21 mg total) onto the skin daily. -     nicotine (NICODERM CQ) 14 mg/24hr patch; Place 1 patch (14 mg total) onto the skin daily. -     nicotine (NICODERM CQ) 7 mg/24hr patch; Place 1 patch (7 mg total) onto the skin daily.  Medication refill -     lisinopril (ZESTRIL) 20 MG tablet; TAKE 1 TABLET (20 MG TOTAL) BY MOUTH DAILY. -     carvedilol (COREG) 12.5 MG tablet; Take 1 tablet (12.5 mg total) by mouth 2 (two) times daily with a meal. -     amLODipine (NORVASC) 10 MG tablet; TAKE 1 TABLET (10 MG TOTAL) BY MOUTH DAILY.   There are no diagnoses linked to this encounter. I am having Hannah Dougherty maintain her Rel[employer], ReliOn Lancet [employer] 30G, amLODipine, carvedilol, hydrochlorothiazide, lisinopril, pravastatin, and Trulicity.  No orders of the defined types were placed in this encounter.    Follow-up:   No follow-ups on file.  The above assessment and management plan was  discussed with the patient. The patient verbalized understanding of and has agreed to the management plan. Patient is aware to call the clinic if symptoms fail to improve or worsen. Patient is aware when to return to the clinic for a follow-up visit. Patient educated on when it is appropriate to go to the emergency department.   Juluis Mire, NP-C

## 2021-01-07 NOTE — Progress Notes (Signed)
F/u DM and HTN Off insulin x 2 weeks Does not take medication qd. States she has stressors.

## 2021-01-08 ENCOUNTER — Other Ambulatory Visit: Payer: Self-pay

## 2021-01-08 LAB — CBC WITH DIFFERENTIAL/PLATELET
Basophils Absolute: 0 10*3/uL (ref 0.0–0.2)
Basos: 0 %
EOS (ABSOLUTE): 0.2 10*3/uL (ref 0.0–0.4)
Eos: 2 %
Hematocrit: 36.9 % (ref 34.0–46.6)
Hemoglobin: 11.8 g/dL (ref 11.1–15.9)
Immature Grans (Abs): 0 10*3/uL (ref 0.0–0.1)
Immature Granulocytes: 0 %
Lymphocytes Absolute: 2.8 10*3/uL (ref 0.7–3.1)
Lymphs: 39 %
MCH: 26.6 pg (ref 26.6–33.0)
MCHC: 32 g/dL (ref 31.5–35.7)
MCV: 83 fL (ref 79–97)
Monocytes Absolute: 0.5 10*3/uL (ref 0.1–0.9)
Monocytes: 7 %
Neutrophils Absolute: 3.7 10*3/uL (ref 1.4–7.0)
Neutrophils: 52 %
Platelets: 271 10*3/uL (ref 150–450)
RBC: 4.43 x10E6/uL (ref 3.77–5.28)
RDW: 13.1 % (ref 11.7–15.4)
WBC: 7.2 10*3/uL (ref 3.4–10.8)

## 2021-01-08 LAB — CMP14+EGFR
ALT: 11 IU/L (ref 0–32)
AST: 13 IU/L (ref 0–40)
Albumin/Globulin Ratio: 1.5 (ref 1.2–2.2)
Albumin: 4.2 g/dL (ref 3.8–4.9)
Alkaline Phosphatase: 129 IU/L — ABNORMAL HIGH (ref 44–121)
BUN/Creatinine Ratio: 12 (ref 9–23)
BUN: 15 mg/dL (ref 6–24)
Bilirubin Total: <0.2 mg/dL (ref 0.0–1.2)
CO2: 23 mmol/L (ref 20–29)
Calcium: 9.4 mg/dL (ref 8.7–10.2)
Chloride: 99 mmol/L (ref 96–106)
Creatinine, Ser: 1.27 mg/dL — ABNORMAL HIGH (ref 0.57–1.00)
Globulin, Total: 2.8 g/dL (ref 1.5–4.5)
Glucose: 334 mg/dL — ABNORMAL HIGH (ref 70–99)
Potassium: 4.5 mmol/L (ref 3.5–5.2)
Sodium: 135 mmol/L (ref 134–144)
Total Protein: 7 g/dL (ref 6.0–8.5)
eGFR: 51 mL/min/{1.73_m2} — ABNORMAL LOW (ref >59)

## 2021-01-08 LAB — LIPID PANEL
Chol/HDL Ratio: 4.2 ratio (ref 0.0–4.4)
Cholesterol, Total: 219 mg/dL — ABNORMAL HIGH (ref 100–199)
HDL: 52 mg/dL (ref >39)
LDL Chol Calc (NIH): 146 mg/dL — ABNORMAL HIGH (ref 0–99)
Triglycerides: 116 mg/dL (ref 0–149)
VLDL Cholesterol Cal: 21 mg/dL (ref 5–40)

## 2021-01-09 ENCOUNTER — Other Ambulatory Visit: Payer: Self-pay

## 2021-01-10 ENCOUNTER — Other Ambulatory Visit: Payer: Self-pay

## 2021-01-10 ENCOUNTER — Other Ambulatory Visit (INDEPENDENT_AMBULATORY_CARE_PROVIDER_SITE_OTHER): Payer: Self-pay | Admitting: Primary Care

## 2021-01-10 DIAGNOSIS — E782 Mixed hyperlipidemia: Secondary | ICD-10-CM

## 2021-01-10 MED ORDER — PRAVASTATIN SODIUM 40 MG PO TABS
40.0000 mg | ORAL_TABLET | Freq: Every day | ORAL | 1 refills | Status: DC
  Filled 2021-01-10: qty 90, 90d supply, fill #0

## 2021-01-16 ENCOUNTER — Other Ambulatory Visit: Payer: Self-pay

## 2021-01-17 ENCOUNTER — Other Ambulatory Visit: Payer: Self-pay

## 2021-01-24 ENCOUNTER — Telehealth (INDEPENDENT_AMBULATORY_CARE_PROVIDER_SITE_OTHER): Payer: Self-pay | Admitting: Primary Care

## 2021-01-24 NOTE — Telephone Encounter (Signed)
Called patient and confirmed that appt date is 11/15 at 10:30.

## 2021-01-24 NOTE — Telephone Encounter (Signed)
Patient says received call for reminder appt for January 15 but appt shows for 11/15. Please call bak to clarfiy

## 2021-01-28 ENCOUNTER — Other Ambulatory Visit: Payer: Self-pay

## 2021-01-28 ENCOUNTER — Encounter (INDEPENDENT_AMBULATORY_CARE_PROVIDER_SITE_OTHER): Payer: Self-pay | Admitting: Primary Care

## 2021-01-28 ENCOUNTER — Ambulatory Visit (INDEPENDENT_AMBULATORY_CARE_PROVIDER_SITE_OTHER): Payer: Self-pay | Admitting: Primary Care

## 2021-01-28 VITALS — BP 168/90 | HR 63 | Temp 97.3°F | Ht 65.0 in | Wt 225.6 lb

## 2021-01-28 DIAGNOSIS — I10 Essential (primary) hypertension: Secondary | ICD-10-CM

## 2021-01-28 NOTE — Patient Instructions (Addendum)
How to Take Your Blood Pressure ?Blood pressure measures how strongly your blood is pressing against the walls of your arteries. Arteries are blood vessels that carry blood from your heart throughout your body. You can take your blood pressure at home with a machine. ?You may need to check your blood pressure at home: ?To check if you have high blood pressure (hypertension). ?To check your blood pressure over time. ?To make sure your blood pressure medicine is working. ?Supplies needed: ?Blood pressure machine, or monitor. ?Dining room chair to sit in. ?Table or desk. ?Small notebook. ?Pencil or pen. ?How to prepare ?Avoid these things for 30 minutes before checking your blood pressure: ?Having drinks with caffeine in them, such as coffee or tea. ?Drinking alcohol. ?Eating. ?Smoking. ?Exercising. ?Do these things five minutes before checking your blood pressure: ?Go to the bathroom and pee (urinate). ?Sit in a dining chair. Do not sit on a soft couch or an armchair. ?Be quiet. Do not talk. ?How to take your blood pressure ?Follow the instructions that came with your machine. If you have a digital blood pressure monitor, these may be the instructions: ?Sit up straight. ?Place your feet on the floor. Do not cross your ankles or legs. ?Rest your left arm at the level of your heart. You may rest it on a table, desk, or chair. ?Pull up your shirt sleeve. ?Wrap the blood pressure cuff around the upper part of your left arm. The cuff should be 1 inch (2.5 cm) above your elbow. It is best to wrap the cuff around bare skin. ?Fit the cuff snugly around your arm. You should be able to place only one finger between the cuff and your arm. ?Place the cord so that it rests in the bend of your elbow. ?Press the power button. ?Sit quietly while the cuff fills with air and loses air. ?Write down the numbers on the screen. ?Wait 2-3 minutes and then repeat steps 1-10. ?What do the numbers mean? ?Two numbers make up your blood  pressure. The first number is called systolic pressure. The second is called diastolic pressure. An example of a blood pressure reading is "120 over 80" (or 120/80). ?If you are an adult and do not have a medical condition, use this guide to find out if your blood pressure is normal: ?Normal ?First number: below 120. ?Second number: below 80. ?Elevated ?First number: 120-129. ?Second number: below 80. ?Hypertension stage 1 ?First number: 130-139. ?Second number: 80-89. ?Hypertension stage 2 ?First number: 140 or above. ?Second number: 90 or above. ?Your blood pressure is above normal even if only the first or only the second number is above normal. ?Follow these instructions at home: ?Medicines ?Take over-the-counter and prescription medicines only as told by your doctor. ?Tell your doctor if your medicine is causing side effects. ?General instructions ?Check your blood pressure as often as your doctor tells you to. ?Check your blood pressure at the same time every day. ?Take your monitor to your next doctor's appointment. Your doctor will: ?Make sure you are using it correctly. ?Make sure it is working right. ?Understand what your blood pressure numbers should be. ?Keep all follow-up visits as told by your doctor. This is important. ?General tips ?You will need a blood pressure machine, or monitor. Your doctor can suggest a monitor. You can buy one at a drugstore or online. When choosing one: ?Choose one with an arm cuff. ?Choose one that wraps around your upper arm. Only one finger should fit between   your arm and the cuff. ?Do not choose one that measures your blood pressure from your wrist or finger. ?Where to find more information ?American Heart Association: www.heart.org ?Contact a doctor if: ?Your blood pressure keeps being high. ?Your blood pressure is suddenly low. ?Get help right away if: ?Your first blood pressure number is higher than 180. ?Your second blood pressure number is higher than  120. ?Summary ?Check your blood pressure at the same time every day. ?Avoid caffeine, alcohol, smoking, and exercise for 30 minutes before checking your blood pressure. ?Make sure you understand what your blood pressure numbers should be. ?This information is not intended to replace advice given to you by your health care provider. Make sure you discuss any questions you have with your health care provider. ?Document Revised: 01/10/2020 Document Reviewed: 02/24/2019 ?Elsevier Patient Education ? 2022 Elsevier Inc. ? ?

## 2021-01-28 NOTE — Progress Notes (Signed)
Hannah Dougherty is a 50 y.o. female presents for hypertension evaluation, Denies shortness of breath, headaches, chest pain or lower extremity edema, sudden onset, vision changes, unilateral weakness, dizziness, paresthesias   Patient reports adherence with medications.  Dietary habits include: monitoring sodium Exercise habits include:yes  Family / Social history: No   Past Medical History:  Diagnosis Date   Diabetes mellitus without complication ([place])    Hypertension    Past Surgical History:  Procedure Laterality Date   TUBAL LIGATION     VOCAL CORD LATERALIZATION, ENDOSCOPIC APPROACH W/ MLB     No Known Allergies Current Outpatient Medications on File Prior to Visit  Medication Sig Dispense Refill   amLODipine (NORVASC) 10 MG tablet TAKE 1 TABLET (10 MG TOTAL) BY MOUTH DAILY. 90 tablet 1   carvedilol (COREG) 12.5 MG tablet Take 1 tablet (12.5 mg total) by mouth 2 (two) times daily with a meal. 180 tablet 3   Dulaglutide (TRULICITY) 1.5 DJ/2.4QA SOPN Inject 1.5 mg into the skin once a week. 2 mL 3   glimepiride (AMARYL) 4 MG tablet Take 1 tablet (4 mg total) by mouth daily before breakfast. 90 tablet 3   glucose blood (RELION CONFIRM/MICRO TEST) test strip Use as instructed (Patient not taking: Reported on 09/18/2020) 100 each 0   hydrochlorothiazide (HYDRODIURIL) 25 MG tablet Take 1 tablet (25 mg total) by mouth daily. 90 tablet 3   lisinopril (ZESTRIL) 20 MG tablet TAKE 1 TABLET (20 MG TOTAL) BY MOUTH DAILY. 90 tablet 1   metFORMIN (GLUCOPHAGE) 1000 MG tablet Take 1 tablet (1,000 mg total) by mouth 2 (two) times daily with a meal. 180 tablet 3   nicotine (NICODERM CQ) 14 mg/24hr patch Place 1 patch (14 mg total) onto the skin daily. 28 patch 0   nicotine (NICODERM CQ) 21 mg/24hr patch Place 1 patch (21 mg total) onto the skin daily. 28 patch 0   nicotine (NICODERM CQ) 7 mg/24hr patch Place 1 patch (7 mg total) onto the skin daily. 28 patch 0    pravastatin (PRAVACHOL) 40 MG tablet Take 1 tablet (40 mg total) by mouth daily. 90 tablet 1   ReliOn Lancet Devices 30G MISC 1 each by Does not apply route 3 (three) times daily. (Patient not taking: Reported on 09/18/2020) 100 each 0   No current facility-administered medications on file prior to visit.   Social History   Socioeconomic History   Marital status: Single    Spouse name: Not on file   Number of children: Not on file   Years of education: Not on file   Highest education level: Not on file  Occupational History   Not on file  Tobacco Use   Smoking status: Some Days    Packs/day: 0.50    Types: Cigarettes   Smokeless tobacco: Never  Substance and Sexual Activity   Alcohol use: Yes   Drug use: Yes    Types: Marijuana   Sexual activity: Not on file  Other Topics Concern   Not on file  Social History Narrative   ** Merged History Encounter **       Social Determinants of Health   Financial Resource Strain: Not on file  Food Insecurity: Not on file  Transportation Needs: Not on file  Physical Activity: Not on file  Stress: Not on file  Social Connections: Not on file  Intimate Partner Violence: Not on file   Family History  Problem Relation Age of Onset  Hypertension Mother    Diabetes Mother    Kidney disease Mother    Diabetes Father    Hypertension Father    Hypertension Maternal Grandmother    Diabetes Maternal Grandmother    Breast cancer Maternal Grandmother      OBJECTIVE: BP (!) 168/90 (BP Location: Left Arm, Patient Position: Sitting, Cuff Size: Large)   Pulse 63   Temp (!) 97.3 F (36.3 C) (Temporal)   Ht 5\' 5"  (1.651 m)   Wt 225 lb 9.6 oz (102.3 kg)   LMP 10/28/2016 (Within Weeks)   SpO2 96%   BMI 37.54 kg/m   There were no vitals filed for this visit. 1088  Physical exam: General: Vital signs reviewed.  Patient is well-developed and well-nourished, obese female in no acute distress and cooperative with exam. Head: Normocephalic  and atraumatic. Eyes: EOMI, conjunctivae normal, no scleral icterus. Neck: Supple, trachea midline, normal ROM, no JVD, masses, thyromegaly, or carotid bruit present. Cardiovascular: RRR, S1 normal, S2 normal, no murmurs, gallops, or rubs. Pulmonary/Chest: Clear to auscultation bilaterally, no wheezes, rales, or rhonchi. Abdominal: Soft, non-tender, non-distended, BS +, no masses, organomegaly, or guarding present. Musculoskeletal: No joint deformities, erythema, or stiffness, ROM full and nontender. Extremities: No lower extremity edema bilaterally,  pulses symmetric and intact bilaterally. No cyanosis or clubbing. Neurological: A&O x3, Strength is normal Skin: Warm, dry and intact. No rashes or erythema. Psychiatric: Normal mood and affect. speech and behavior is normal. Cognition and memory are normal.     ROS .  Comprehensive review of systems noted positive and negative in HPI Last 3 Office BP readings: BP Readings from Last 3 Encounters:  01/07/21 (!) 180/92  09/18/20 (!) 153/83  06/19/20 (!) 174/96    BMET    Component Value Date/Time   NA 135 01/07/2021 1635   K 4.5 01/07/2021 1635   CL 99 01/07/2021 1635   CO2 23 01/07/2021 1635   GLUCOSE 334 (H) 01/07/2021 1635   GLUCOSE 218 (H) 02/18/2020 0302   BUN 15 01/07/2021 1635   CREATININE 1.27 (H) 01/07/2021 1635   CALCIUM 9.4 01/07/2021 1635   GFRNONAA >60 02/18/2020 0302   GFRAA >60 08/16/2019 1445    Renal function: CrCl cannot be calculated (Unknown ideal weight.).  Hydrochlorothiazide 25 mg  Clinical ASCVD: Yes  The 10-year ASCVD risk score (Arnett DK, et al., 2019) is: 57.4%   Values used to calculate the score:     Age: 77 years     Sex: Female     Is Non-Hispanic African American: Yes     Diabetic: Yes     Tobacco smoker: Yes     Systolic Blood Pressure: 188 mmHg     Is BP treated: Yes     HDL Cholesterol: 52 mg/dL     Total Cholesterol: 219 mg/dL   Hannah Dougherty was seen today for  hypertension.  Diagnoses and all orders for this visit:  Primary hypertension   -Counseled on lifestyle modifications for blood pressure control including reduced dietary sodium, increased exercise, weight reduction and adequate sleep. Also, educated patient about the risk for cardiovascular events, stroke and heart attack. Also counseled patient about the importance of medication adherence. If you participate in smoking, it is important to stop using tobacco as this will increase the risks associated with uncontrolled blood pressure.   -Hypertension longstanding/newly diagnosed currently on Coreg 12.5 twice daily, hydrochlorothiazide 25 mg and lisinopril 20 mg daily on current medications. Patient is adherent with current medications.   Goal  BP:  For patients younger than 60: Goal BP < 130/80. For patients 60 and older: Goal BP < 140/90. For patients with diabetes: Goal BP < 130/80. Your most recent BP: 168/90  Minimize salt intake. Minimize alcohol intake    This note has been created with Surveyor, quantity. Any transcriptional errors are unintentional.   [name], NP 01/28/2021, 10:24 AM

## 2021-02-04 ENCOUNTER — Other Ambulatory Visit: Payer: Self-pay

## 2021-04-30 ENCOUNTER — Ambulatory Visit (INDEPENDENT_AMBULATORY_CARE_PROVIDER_SITE_OTHER): Payer: Medicaid Other | Admitting: Primary Care

## 2021-06-08 ENCOUNTER — Inpatient Hospital Stay (HOSPITAL_COMMUNITY)
Admission: EM | Admit: 2021-06-08 | Discharge: 2021-06-13 | DRG: 305 | Disposition: A | Discharge status: Home / Self Care | Payer: 59 | Attending: Family Medicine | Admitting: Family Medicine

## 2021-06-08 ENCOUNTER — Encounter (HOSPITAL_COMMUNITY): Payer: Self-pay

## 2021-06-08 ENCOUNTER — Other Ambulatory Visit: Payer: Self-pay

## 2021-06-08 ENCOUNTER — Emergency Department (HOSPITAL_COMMUNITY): Payer: 59

## 2021-06-08 DIAGNOSIS — I129 Hypertensive chronic kidney disease with stage 1 through stage 4 chronic kidney disease, or unspecified chronic kidney disease: Secondary | ICD-10-CM | POA: Diagnosis present

## 2021-06-08 DIAGNOSIS — E669 Obesity, unspecified: Secondary | ICD-10-CM | POA: Diagnosis present

## 2021-06-08 DIAGNOSIS — E871 Hypo-osmolality and hyponatremia: Secondary | ICD-10-CM | POA: Diagnosis not present

## 2021-06-08 DIAGNOSIS — E876 Hypokalemia: Secondary | ICD-10-CM | POA: Diagnosis not present

## 2021-06-08 DIAGNOSIS — E782 Mixed hyperlipidemia: Secondary | ICD-10-CM

## 2021-06-08 DIAGNOSIS — Z7984 Long term (current) use of oral hypoglycemic drugs: Secondary | ICD-10-CM

## 2021-06-08 DIAGNOSIS — Z6835 Body mass index (BMI) 35.0-35.9, adult: Secondary | ICD-10-CM

## 2021-06-08 DIAGNOSIS — K219 Gastro-esophageal reflux disease without esophagitis: Secondary | ICD-10-CM | POA: Diagnosis present

## 2021-06-08 DIAGNOSIS — E1165 Type 2 diabetes mellitus with hyperglycemia: Secondary | ICD-10-CM | POA: Diagnosis present

## 2021-06-08 DIAGNOSIS — Z597 Insufficient social insurance and welfare support: Secondary | ICD-10-CM

## 2021-06-08 DIAGNOSIS — E1122 Type 2 diabetes mellitus with diabetic chronic kidney disease: Secondary | ICD-10-CM | POA: Diagnosis present

## 2021-06-08 DIAGNOSIS — E119 Type 2 diabetes mellitus without complications: Secondary | ICD-10-CM

## 2021-06-08 DIAGNOSIS — N2889 Other specified disorders of kidney and ureter: Secondary | ICD-10-CM | POA: Diagnosis present

## 2021-06-08 DIAGNOSIS — K529 Noninfective gastroenteritis and colitis, unspecified: Secondary | ICD-10-CM

## 2021-06-08 DIAGNOSIS — I10 Essential (primary) hypertension: Secondary | ICD-10-CM

## 2021-06-08 DIAGNOSIS — Z841 Family history of disorders of kidney and ureter: Secondary | ICD-10-CM

## 2021-06-08 DIAGNOSIS — K090 Developmental odontogenic cysts: Secondary | ICD-10-CM | POA: Diagnosis present

## 2021-06-08 DIAGNOSIS — Z8249 Family history of ischemic heart disease and other diseases of the circulatory system: Secondary | ICD-10-CM

## 2021-06-08 DIAGNOSIS — E1169 Type 2 diabetes mellitus with other specified complication: Secondary | ICD-10-CM | POA: Diagnosis present

## 2021-06-08 DIAGNOSIS — Z833 Family history of diabetes mellitus: Secondary | ICD-10-CM

## 2021-06-08 DIAGNOSIS — F17210 Nicotine dependence, cigarettes, uncomplicated: Secondary | ICD-10-CM | POA: Diagnosis present

## 2021-06-08 DIAGNOSIS — N1831 Chronic kidney disease, stage 3a: Secondary | ICD-10-CM | POA: Diagnosis present

## 2021-06-08 DIAGNOSIS — E66812 Obesity, class 2: Secondary | ICD-10-CM | POA: Diagnosis present

## 2021-06-08 DIAGNOSIS — R4781 Slurred speech: Secondary | ICD-10-CM | POA: Diagnosis not present

## 2021-06-08 DIAGNOSIS — Z79899 Other long term (current) drug therapy: Secondary | ICD-10-CM

## 2021-06-08 DIAGNOSIS — R112 Nausea with vomiting, unspecified: Principal | ICD-10-CM

## 2021-06-08 DIAGNOSIS — C641 Malignant neoplasm of right kidney, except renal pelvis: Secondary | ICD-10-CM | POA: Diagnosis present

## 2021-06-08 DIAGNOSIS — I959 Hypotension, unspecified: Secondary | ICD-10-CM | POA: Diagnosis not present

## 2021-06-08 DIAGNOSIS — I160 Hypertensive urgency: Principal | ICD-10-CM | POA: Diagnosis present

## 2021-06-08 DIAGNOSIS — R29810 Facial weakness: Secondary | ICD-10-CM | POA: Diagnosis not present

## 2021-06-08 DIAGNOSIS — E785 Hyperlipidemia, unspecified: Secondary | ICD-10-CM | POA: Diagnosis present

## 2021-06-08 DIAGNOSIS — R739 Hyperglycemia, unspecified: Secondary | ICD-10-CM

## 2021-06-08 LAB — URINALYSIS, MICROSCOPIC (REFLEX): Bacteria, UA: NONE SEEN

## 2021-06-08 LAB — URINALYSIS, ROUTINE W REFLEX MICROSCOPIC
Bilirubin Urine: NEGATIVE
Glucose, UA: >=500 mg/dL — AB
Ketones, ur: 40 mg/dL — AB
Leukocytes,Ua: NEGATIVE
Nitrite: NEGATIVE
Protein, ur: >300 mg/dL — AB
Specific Gravity, Urine: 1.025 (ref 1.005–1.030)
pH: 6 (ref 5.0–8.0)

## 2021-06-08 LAB — CBG MONITORING, ED
Glucose-Capillary: 325 mg/dL — ABNORMAL HIGH (ref 70–99)
Glucose-Capillary: 178 mg/dL — ABNORMAL HIGH (ref 70–99)

## 2021-06-08 LAB — COMPREHENSIVE METABOLIC PANEL
ALT: 18 U/L (ref 0–44)
AST: 18 U/L (ref 15–41)
Albumin: 4.1 g/dL (ref 3.5–5.0)
Alkaline Phosphatase: 96 U/L (ref 38–126)
Anion gap: 14 (ref 5–15)
BUN: 17 mg/dL (ref 6–20)
CO2: 25 mmol/L (ref 22–32)
Calcium: 9.2 mg/dL (ref 8.9–10.3)
Chloride: 92 mmol/L — ABNORMAL LOW (ref 98–111)
Creatinine, Ser: 1.14 mg/dL — ABNORMAL HIGH (ref 0.44–1.00)
GFR, Estimated: 58 mL/min — ABNORMAL LOW (ref >60)
Glucose, Bld: 412 mg/dL — ABNORMAL HIGH (ref 70–99)
Potassium: 3.7 mmol/L (ref 3.5–5.1)
Sodium: 131 mmol/L — ABNORMAL LOW (ref 135–145)
Total Bilirubin: 0.6 mg/dL (ref 0.3–1.2)
Total Protein: 8.3 g/dL — ABNORMAL HIGH (ref 6.5–8.1)

## 2021-06-08 LAB — CBC WITH DIFFERENTIAL/PLATELET
Abs Immature Granulocytes: 0.06 10*3/uL (ref 0.00–0.07)
Basophils Absolute: 0 10*3/uL (ref 0.0–0.1)
Basophils Relative: 0 %
Eosinophils Absolute: 0 10*3/uL (ref 0.0–0.5)
Eosinophils Relative: 0 %
HCT: 42.9 % (ref 36.0–46.0)
Hemoglobin: 13.9 g/dL (ref 12.0–15.0)
Immature Granulocytes: 1 %
Lymphocytes Relative: 14 %
Lymphs Abs: 1.6 10*3/uL (ref 0.7–4.0)
MCH: 26.5 pg (ref 26.0–34.0)
MCHC: 32.4 g/dL (ref 30.0–36.0)
MCV: 81.9 fL (ref 80.0–100.0)
Monocytes Absolute: 0.4 10*3/uL (ref 0.1–1.0)
Monocytes Relative: 3 %
Neutro Abs: 9.3 10*3/uL — ABNORMAL HIGH (ref 1.7–7.7)
Neutrophils Relative %: 82 %
Platelets: 252 10*3/uL (ref 150–400)
RBC: 5.24 MIL/uL — ABNORMAL HIGH (ref 3.87–5.11)
RDW: 14.1 % (ref 11.5–15.5)
WBC: 11.4 10*3/uL — ABNORMAL HIGH (ref 4.0–10.5)
nRBC: 0 % (ref 0.0–0.2)

## 2021-06-08 LAB — HEMOGLOBIN A1C
Hgb A1c MFr Bld: 13.2 % — ABNORMAL HIGH (ref 4.8–5.6)
Mean Plasma Glucose: 332.14 mg/dL

## 2021-06-08 LAB — GLUCOSE, CAPILLARY
Glucose-Capillary: 141 mg/dL — ABNORMAL HIGH (ref 70–99)
Glucose-Capillary: 198 mg/dL — ABNORMAL HIGH (ref 70–99)

## 2021-06-08 LAB — LIPASE, BLOOD: Lipase: 76 U/L — ABNORMAL HIGH (ref 11–51)

## 2021-06-08 LAB — TROPONIN I (HIGH SENSITIVITY)
Troponin I (High Sensitivity): 9 ng/L (ref <18)
Troponin I (High Sensitivity): 10 ng/L (ref <18)

## 2021-06-08 LAB — MAGNESIUM: Magnesium: 1.7 mg/dL (ref 1.7–2.4)

## 2021-06-08 IMAGING — CT CT ABD-PELV W/ CM
2 of 5 series · 15 of 46 positions shown, 17 images · IV contrast (agent unspecified)
Comparison: CT the abdomen and pelvis [DATE].

CLINICAL DATA: 51-year-old female with history of acute onset of
nonlocalized abdominal pain.

EXAM:
CT ABDOMEN AND PELVIS WITH CONTRAST
TECHNIQUE: Multidetector CT imaging of the abdomen and pelvis was performed
using the standard protocol following bolus administration of
intravenous contrast.

[Series 2: axial st · axial · 0.71mm/px · z∈[-518,-128]mm · 12 of 92 slices shown, 14 images]
[im 7/92  soft-tissue]
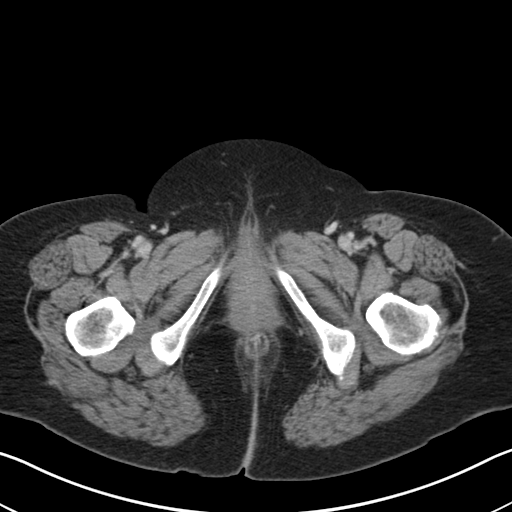
[im 7/92  bone]
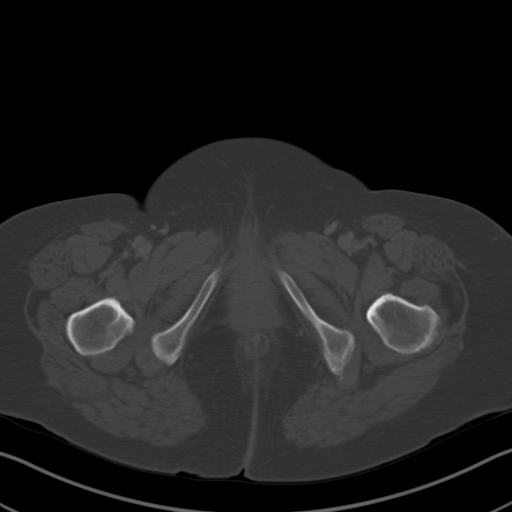
[im 13/92  soft-tissue]
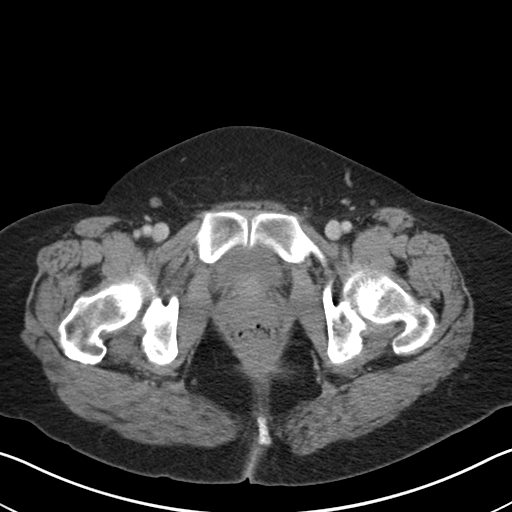
[im 19/92  soft-tissue]
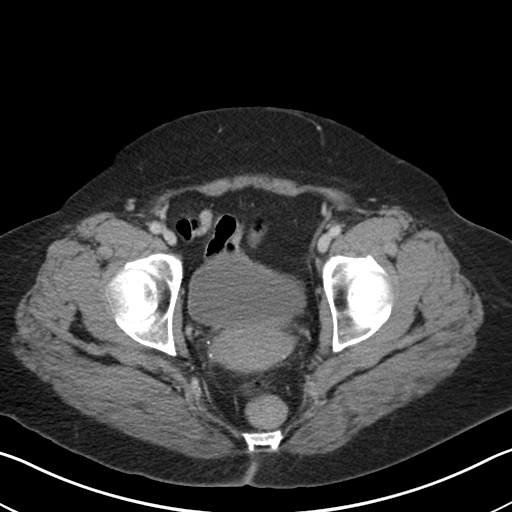
[im 31/92  soft-tissue]
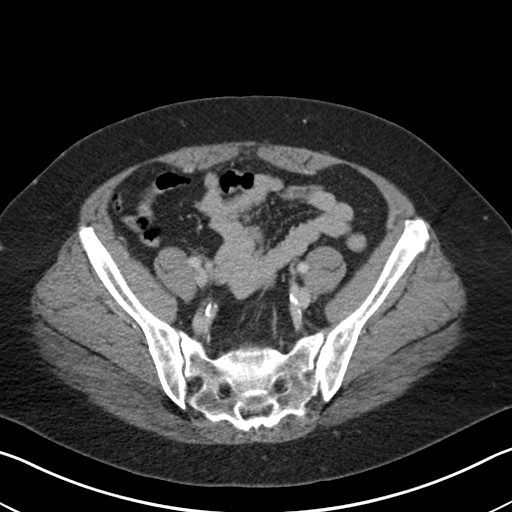
[im 37/92  soft-tissue]
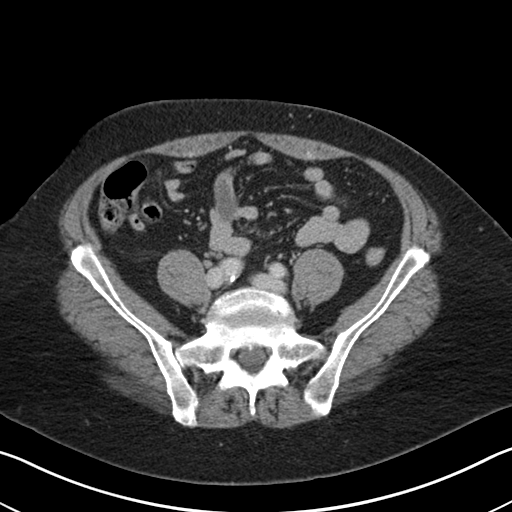
[im 43/92  soft-tissue]
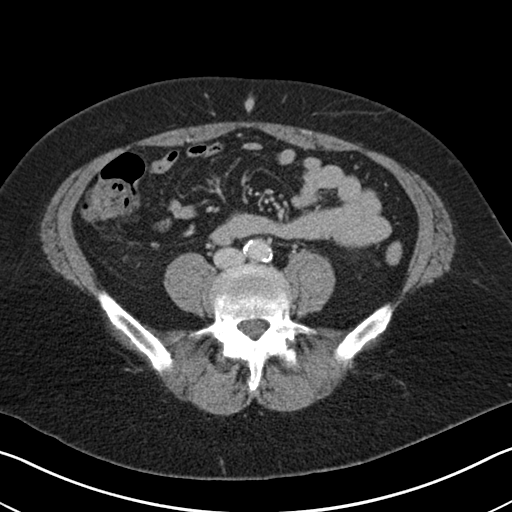
[im 49/92  soft-tissue]
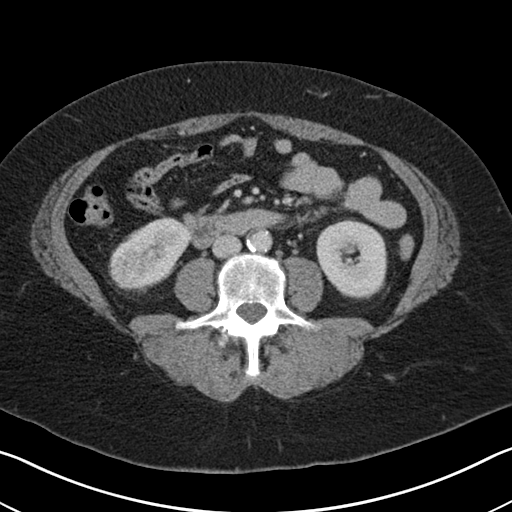
[im 55/92  soft-tissue]
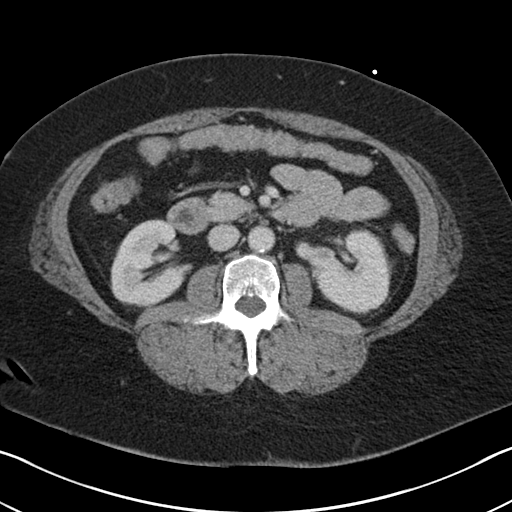
[im 61/92  soft-tissue]
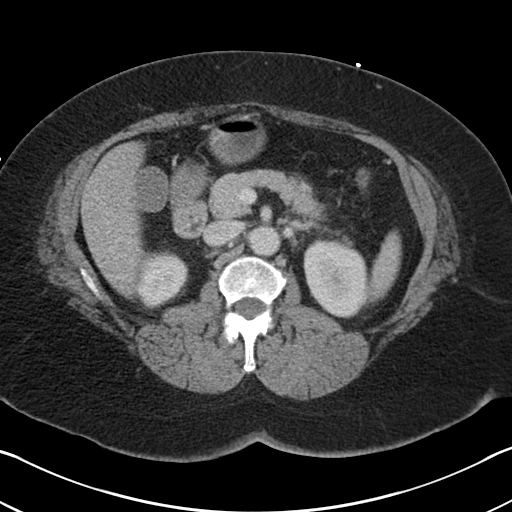
[im 61/92  bone]
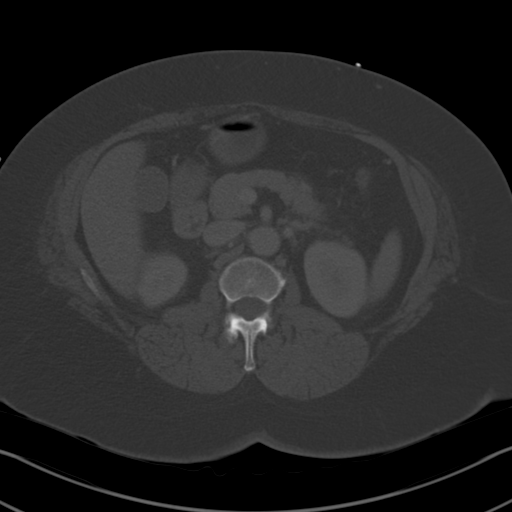
[im 73/92  soft-tissue]
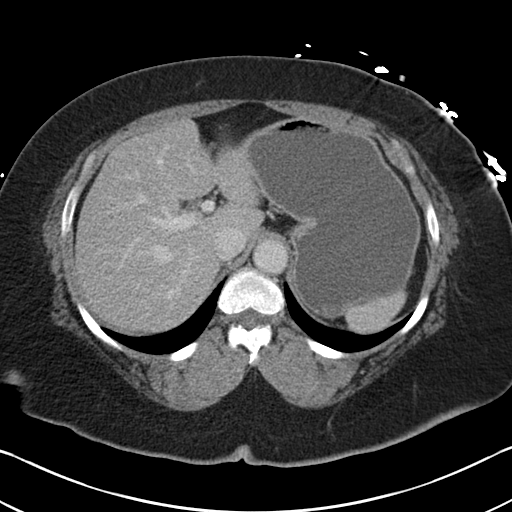
[im 79/92  soft-tissue]
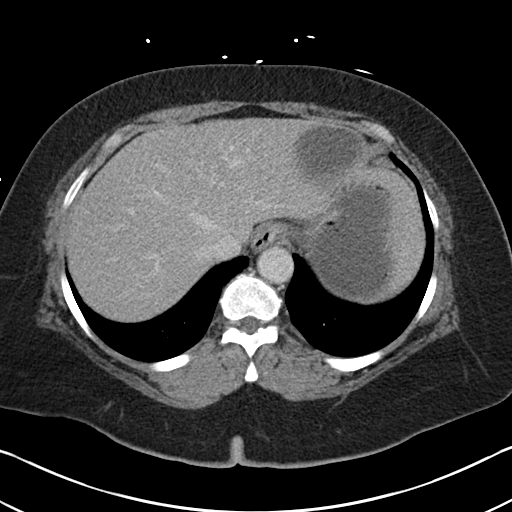
[im 85/92  soft-tissue]
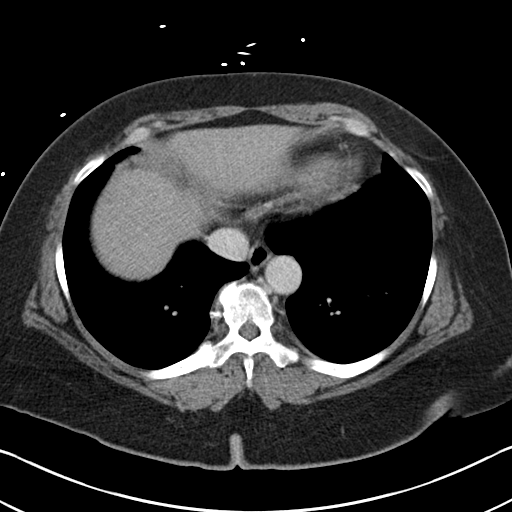

[Series 4: coronal st · coronal · 0.81mm/px · 3 of 151 slices shown]
[im 51/151  soft-tissue]
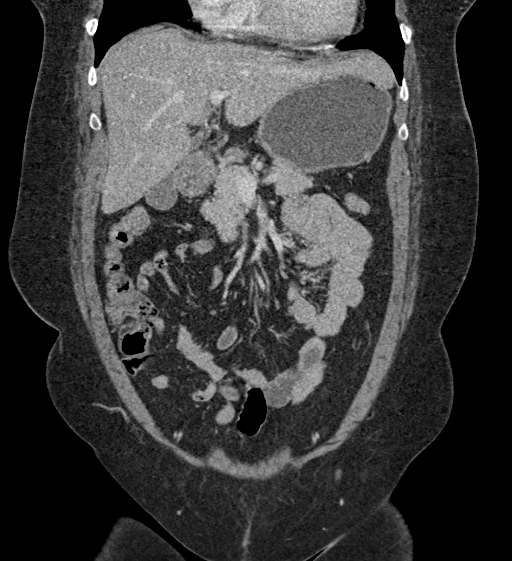
[im 67/151  soft-tissue]
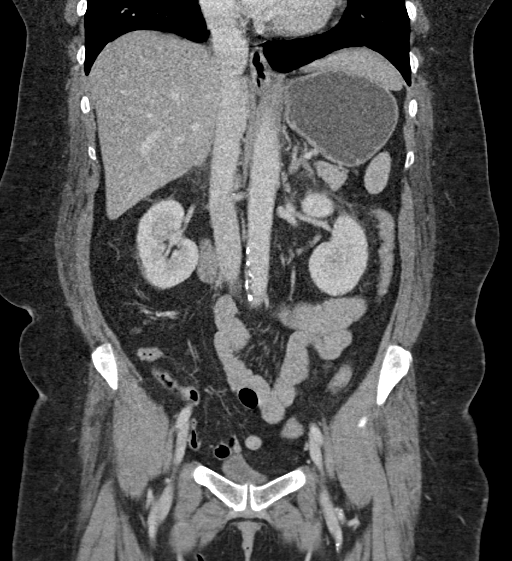
[im 84/151  soft-tissue]
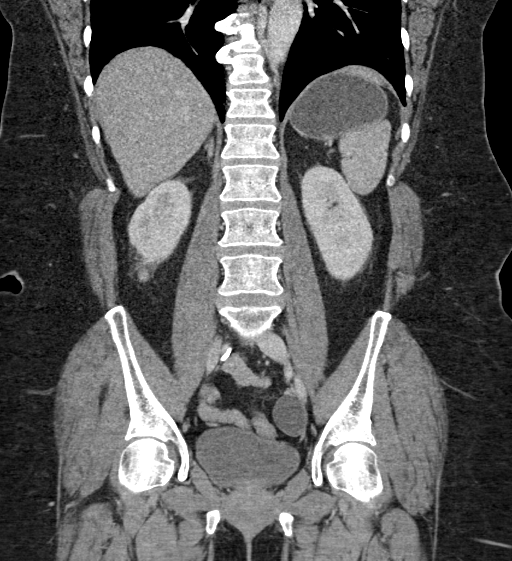

[15 of 46 positions shown; findings below may reference images not displayed]

RADIATION DOSE REDUCTION: This exam was performed according to the
departmental dose-optimization program which includes automated
exposure control, adjustment of the mA and/or kV according to
patient size and/or use of iterative reconstruction technique.

CONTRAST:  100mL OMNIPAQUE IOHEXOL 300 MG/ML  SOLN
FINDINGS: Lower chest: Unremarkable.

Hepatobiliary: No suspicious cystic or solid hepatic lesions. No
intra or extrahepatic biliary ductal dilatation. Gallbladder is
normal in appearance.

Pancreas: No pancreatic mass. No pancreatic ductal dilatation. No
pancreatic or peripancreatic fluid collections or inflammatory
changes.

Spleen: Unremarkable.

Adrenals/Urinary Tract: In the lower pole of the right kidney (axial
image 46 of series 2 and coronal image 76 of series 4) there is a
partially exophytic 2.9 x 2.1 x 2.4 cm heterogeneously enhancing
lesion, highly suspicious for renal cell carcinoma. This is
encapsulated within Gerota's fascia and is well separated from the
right renal vein which is widely patent. Left kidney and bilateral
adrenal glands are normal in appearance. No hydroureteronephrosis.
Urinary bladder is normal in appearance.

Stomach/Bowel: The appearance of the stomach is normal. No
pathologic dilatation of small bowel or colon. Normal appendix.

Vascular/Lymphatic: Aortic atherosclerosis, without evidence of
aneurysm or dissection in the abdominal or pelvic vasculature. No
lymphadenopathy noted in the abdomen or pelvis.

Reproductive: Uterus and right ovary are unremarkable in appearance.
In the left ovary (axial image 68 of series 2 and coronal image 86
of series 4) there is a well-defined low-attenuation 3.1 x 2.4 x
cm lesion, compatible with a small ovarian cyst, similar to the
prior study.

Other: No significant volume of ascites.  No pneumoperitoneum.

Musculoskeletal: There are no aggressive appearing lytic or blastic
lesions noted in the visualized portions of the skeleton.
IMPRESSION: 1. No acute findings are noted in the abdomen or pelvis to account
for the patient's symptoms.
2. 2.9 x 2.1 x 2.4 cm enhancing lesion in the lower pole of the
right kidney is compatible with a slow growing neoplasm, slightly
enlarged compared to the prior study, likely a renal cell carcinoma.
This remains encapsulated within Gerota's fascia and is separate
from the right renal vein which is widely patent. No lymphadenopathy
or definite signs of metastatic disease on today's examination.
Nonemergent outpatient referral to Urology for further clinical
management is recommended.
3. Stable simple appearing left ovarian cyst.

## 2021-06-08 MED ORDER — INSULIN ASPART 100 UNIT/ML IJ SOLN
5.0000 [IU] | Freq: Once | INTRAMUSCULAR | Status: AC
  Administered 2021-06-08: 5 [IU] via INTRAVENOUS
  Filled 2021-06-08: qty 0.05

## 2021-06-08 MED ORDER — IOHEXOL 300 MG/ML  SOLN
100.0000 mL | Freq: Once | INTRAMUSCULAR | Status: AC | PRN
  Administered 2021-06-08: 100 mL via INTRAVENOUS

## 2021-06-08 MED ORDER — INSULIN ASPART 100 UNIT/ML IJ SOLN
15.0000 [IU] | Freq: Once | INTRAMUSCULAR | Status: AC
  Administered 2021-06-08: 15 [IU] via SUBCUTANEOUS
  Filled 2021-06-08: qty 0.15

## 2021-06-08 MED ORDER — PROCHLORPERAZINE EDISYLATE 10 MG/2ML IJ SOLN
10.0000 mg | Freq: Once | INTRAMUSCULAR | Status: AC
  Administered 2021-06-08: 10 mg via INTRAVENOUS
  Filled 2021-06-08: qty 2

## 2021-06-08 MED ORDER — GLIMEPIRIDE 4 MG PO TABS
4.0000 mg | ORAL_TABLET | Freq: Every day | ORAL | Status: DC
  Administered 2021-06-09: 4 mg via ORAL
  Filled 2021-06-08: qty 1

## 2021-06-08 MED ORDER — HYDRALAZINE HCL 20 MG/ML IJ SOLN
20.0000 mg | Freq: Once | INTRAMUSCULAR | Status: AC
  Administered 2021-06-08: 20 mg via INTRAVENOUS
  Filled 2021-06-08: qty 1

## 2021-06-08 MED ORDER — ONDANSETRON HCL 4 MG/2ML IJ SOLN
4.0000 mg | Freq: Four times a day (QID) | INTRAMUSCULAR | Status: DC | PRN
Start: 2021-06-08 — End: 2021-06-13
  Administered 2021-06-09 – 2021-06-11 (×2): 4 mg via INTRAVENOUS
  Filled 2021-06-08 (×2): qty 2

## 2021-06-08 MED ORDER — LISINOPRIL 20 MG PO TABS: 20.0000 mg | ORAL_TABLET | Freq: Every day | ORAL | Status: DC

## 2021-06-08 MED ORDER — CARVEDILOL 12.5 MG PO TABS
12.5000 mg | ORAL_TABLET | ORAL | Status: AC
  Administered 2021-06-08: 12.5 mg via ORAL
  Filled 2021-06-08: qty 1

## 2021-06-08 MED ORDER — ONDANSETRON HCL 4 MG PO TABS
4.0000 mg | ORAL_TABLET | Freq: Four times a day (QID) | ORAL | Status: DC | PRN
  Administered 2021-06-11: 4 mg via ORAL
  Filled 2021-06-08: qty 1

## 2021-06-08 MED ORDER — HYDRALAZINE HCL 50 MG PO TABS
50.0000 mg | ORAL_TABLET | Freq: Four times a day (QID) | ORAL | Status: DC | PRN
  Administered 2021-06-08 – 2021-06-11 (×4): 50 mg via ORAL
  Filled 2021-06-08 (×5): qty 1

## 2021-06-08 MED ORDER — LABETALOL HCL 5 MG/ML IV SOLN
10.0000 mg | Freq: Once | INTRAVENOUS | Status: AC
  Administered 2021-06-08: 10 mg via INTRAVENOUS
  Filled 2021-06-08: qty 4

## 2021-06-08 MED ORDER — CARVEDILOL 12.5 MG PO TABS
12.5000 mg | ORAL_TABLET | Freq: Two times a day (BID) | ORAL | Status: DC
  Administered 2021-06-08 – 2021-06-10 (×4): 12.5 mg via ORAL
  Filled 2021-06-08 (×4): qty 1

## 2021-06-08 MED ORDER — NICOTINE 14 MG/24HR TD PT24
14.0000 mg | MEDICATED_PATCH | Freq: Every day | TRANSDERMAL | Status: DC
  Administered 2021-06-09 – 2021-06-13 (×5): 14 mg via TRANSDERMAL
  Filled 2021-06-08 (×5): qty 1

## 2021-06-08 MED ORDER — PRAVASTATIN SODIUM 20 MG PO TABS
40.0000 mg | ORAL_TABLET | Freq: Every day | ORAL | Status: DC
  Administered 2021-06-08 – 2021-06-13 (×6): 40 mg via ORAL
  Filled 2021-06-08 (×7): qty 2

## 2021-06-08 MED ORDER — INSULIN ASPART 100 UNIT/ML IJ SOLN
0.0000 [IU] | Freq: Three times a day (TID) | INTRAMUSCULAR | Status: DC
  Administered 2021-06-08: 4 [IU] via SUBCUTANEOUS
  Administered 2021-06-08: 3 [IU] via SUBCUTANEOUS
  Administered 2021-06-09 (×2): 7 [IU] via SUBCUTANEOUS
  Administered 2021-06-09: 4 [IU] via SUBCUTANEOUS
  Administered 2021-06-10: 7 [IU] via SUBCUTANEOUS
  Administered 2021-06-10: 4 [IU] via SUBCUTANEOUS
  Filled 2021-06-08: qty 0.2

## 2021-06-08 MED ORDER — DIPHENHYDRAMINE HCL 50 MG/ML IJ SOLN
25.0000 mg | Freq: Once | INTRAMUSCULAR | Status: AC
  Administered 2021-06-08: 25 mg via INTRAVENOUS
  Filled 2021-06-08: qty 1

## 2021-06-08 MED ORDER — METFORMIN HCL 500 MG PO TABS: 1000.0000 mg | ORAL_TABLET | Freq: Two times a day (BID) | ORAL | Status: DC

## 2021-06-08 MED ORDER — AMLODIPINE BESYLATE 5 MG PO TABS
10.0000 mg | ORAL_TABLET | Freq: Once | ORAL | Status: AC
  Administered 2021-06-08: 10 mg via ORAL
  Filled 2021-06-08: qty 2

## 2021-06-08 MED ORDER — ONDANSETRON HCL 4 MG/2ML IJ SOLN
4.0000 mg | Freq: Once | INTRAMUSCULAR | Status: AC
  Administered 2021-06-08: 4 mg via INTRAVENOUS
  Filled 2021-06-08: qty 2

## 2021-06-08 MED ORDER — ACETAMINOPHEN 650 MG RE SUPP: 650.0000 mg | Freq: Four times a day (QID) | RECTAL | Status: DC | PRN

## 2021-06-08 MED ORDER — SODIUM CHLORIDE 0.9 % IV BOLUS
1000.0000 mL | Freq: Once | INTRAVENOUS | Status: AC
  Administered 2021-06-08: 1000 mL via INTRAVENOUS

## 2021-06-08 MED ORDER — LISINOPRIL 10 MG PO TABS
20.0000 mg | ORAL_TABLET | Freq: Every day | ORAL | Status: DC
Start: 2021-06-08 — End: 2021-06-08

## 2021-06-08 MED ORDER — POTASSIUM CHLORIDE IN NACL 20-0.45 MEQ/L-% IV SOLN
INTRAVENOUS | Status: DC
  Filled 2021-06-08 (×2): qty 1000

## 2021-06-08 MED ORDER — AMLODIPINE BESYLATE 5 MG PO TABS
10.0000 mg | ORAL_TABLET | Freq: Every day | ORAL | Status: DC
Start: 2021-06-09 — End: 2021-06-10
  Administered 2021-06-09 – 2021-06-10 (×2): 10 mg via ORAL
  Filled 2021-06-08 (×2): qty 2

## 2021-06-08 MED ORDER — HYDROCHLOROTHIAZIDE 12.5 MG PO TABS
12.5000 mg | ORAL_TABLET | Freq: Every day | ORAL | Status: DC
Start: 2021-06-08 — End: 2021-06-09
  Administered 2021-06-08: 12.5 mg via ORAL
  Filled 2021-06-08: qty 1

## 2021-06-08 MED ORDER — ACETAMINOPHEN 325 MG PO TABS
650.0000 mg | ORAL_TABLET | Freq: Four times a day (QID) | ORAL | Status: DC | PRN
  Administered 2021-06-09 – 2021-06-11 (×3): 650 mg via ORAL
  Filled 2021-06-08 (×4): qty 2

## 2021-06-08 MED ORDER — PANTOPRAZOLE SODIUM 40 MG IV SOLR
40.0000 mg | INTRAVENOUS | Status: DC
  Administered 2021-06-08: 40 mg via INTRAVENOUS
  Filled 2021-06-08: qty 10

## 2021-06-08 MED ORDER — LISINOPRIL 10 MG PO TABS
20.0000 mg | ORAL_TABLET | Freq: Once | ORAL | Status: AC
  Administered 2021-06-08: 20 mg via ORAL
  Filled 2021-06-08: qty 2

## 2021-06-08 NOTE — ED Notes (Signed)
MD at bedside. 

## 2021-06-08 NOTE — ED Triage Notes (Addendum)
Patient has been vomiting since yesterday. She said she is also having some leg cramping. Has diabetes, but stated she cannot afford the test strip and has not checked her sugar in a while. Has not taken her BP medications, said she cannot pay for them now. ?

## 2021-06-08 NOTE — H&P (Signed)
?History and Physical  ? ? ?Patient: Hannah Dougherty JKK:[id number] DOB: 1970-05-08 ?DOA: 06/08/2021 ?DOS: the patient was seen and examined on 06/08/2021 ?PCP: [name], NP  ? ?Patient coming from: Home ? ?Chief Complaint:  ?Chief Complaint  ?Patient presents with  ? Emesis  ? ?HPI: Hannah Dougherty is a 51 y.o. female with medical history significant of class II obesity, type II DM, hypertension, hyperlipidemia, right kidney mass, active tobacco use who came into the emergency department due to multiple episodes of nausea, emesis and diarrhea since yesterday associated with abdominal cramping, but no fever, chills or night sweats.  No melena or hematochezia.  No flank pain, dysuria, frequency or hematuria.  No dyspnea, productive cough, wheezing or hemoptysis.  Chest pain, palpitations, diaphoresis, PND, orthopnea, but stated she gets occasional lower extremity edema.  No polyuria, polydipsia, polyphagia or blurred vision.  She had not been taking her regular medications due to lack of insurance coverage. ? ?ED course: Initial vital signs were temperature 97.9 ?F, pulse 78, respiration 18, BP 215/104 mmHg and O2 sat 100% on room air.  The patient received amlodipine 10 mg p.o. x1, carvedilol 12.5 mg p.o. x1, Benadryl 25 mg IVP, hydralazine 20 mg IVP, NovoLog 15 units SQ, NovoLog 5 units SQ, labetalol 10 mg IVP, lisinopril 20 mg p.o. x1 ondansetron 4 mg IVP, prochlorperazine 10 mg IVP and normal saline 1000 mL bolus. ? ?Lab work: Urinalysis showed glucosuria more than 500, ketonuria of 40 and proteinuria of more than 300 mg/dL, urine microscopic examination was unremarkable.  CBC showed white count 11.4, hemoglobin 13.9 g/dL and platelets 252.  Troponin x2 was normal.  Lipase 76 units/L.  CMP showed a glucose of 412 and creatinine of 1.14 mg/dL.  Total protein was 8.3 g/dL.  The rest of the CMP measurements were unremarkable once electrolytes were corrected to hyperglycemia. ? ?Imaging: CT head without contrast  was negative for acute event.  CT abdomen/pelvis with contrast with no acute finding but a slowly growing right kidney mass 2.9 x 2.1 x 2.4 suspicious for renal cell carcinoma. ?  ?Review of Systems: As mentioned in the history of present illness. All other systems reviewed and are negative. ?Past Medical History:  ?Diagnosis Date  ? Diabetes mellitus without complication (Hannah Dougherty)   ? Hypertension   ? ?Past Surgical History:  ?Procedure Laterality Date  ? TUBAL LIGATION    ? VOCAL CORD LATERALIZATION, ENDOSCOPIC APPROACH W/ MLB    ? ?Social History:  reports that she has been smoking cigarettes. She has been smoking an average of .5 packs per day. She has never used smokeless tobacco. She reports current alcohol use. She reports current drug use. Drug: Marijuana. ? ?No Known Allergies ? ?Family History  ?Problem Relation Age of Onset  ? Hypertension Mother   ? Diabetes Mother   ? Kidney disease Mother   ? Diabetes Father   ? Hypertension Father   ? Hypertension Maternal Grandmother   ? Diabetes Maternal Grandmother   ? Breast cancer Maternal Grandmother   ? ? ?Prior to Admission medications   ?Medication Sig Start Date End Date Taking? Authorizing Provider  ?amLODipine (NORVASC) 10 MG tablet TAKE 1 TABLET (10 MG TOTAL) BY MOUTH DAILY. 01/07/21 01/07/22  [name], NP  ?carvedilol (COREG) 12.5 MG tablet Take 1 tablet (12.5 mg total) by mouth 2 (two) times daily with a meal. 01/07/21   [name], NP  ?Dulaglutide (TRULICITY) 1.5 ZJ/6.9CV SOPN Inject 1.5 mg into the skin once  a week. ?Patient not taking: Reported on 01/28/2021 01/07/21   [name], NP  ?glimepiride (AMARYL) 4 MG tablet Take 1 tablet (4 mg total) by mouth daily before breakfast. 01/07/21   [name], NP  ?glucose blood (RELION CONFIRM/MICRO TEST) test strip Use as instructed ?Patient not taking: Reported on 09/18/2020 12/11/18   Asencion Noble, MD  ?hydrochlorothiazide (HYDRODIURIL) 25 MG tablet Take 1 tablet (25 mg  total) by mouth daily. 01/07/21   [name], NP  ?lisinopril (ZESTRIL) 20 MG tablet TAKE 1 TABLET (20 MG TOTAL) BY MOUTH DAILY. 01/07/21 01/07/22  [name], NP  ?metFORMIN (GLUCOPHAGE) 1000 MG tablet Take 1 tablet (1,000 mg total) by mouth 2 (two) times daily with a meal. ?Patient not taking: Reported on 01/28/2021 01/07/21   [name], NP  ?nicotine (NICODERM CQ) 14 mg/24hr patch Place 1 patch (14 mg total) onto the skin daily. ?Patient not taking: Reported on 01/28/2021 01/07/21   [name], NP  ?nicotine (NICODERM CQ) 21 mg/24hr patch Place 1 patch (21 mg total) onto the skin daily. ?Patient not taking: Reported on 01/28/2021 01/07/21   [name], NP  ?nicotine (NICODERM CQ) 7 mg/24hr patch Place 1 patch (7 mg total) onto the skin daily. ?Patient not taking: Reported on 01/28/2021 01/07/21   [name], NP  ?pravastatin (PRAVACHOL) 40 MG tablet Take 1 tablet (40 mg total) by mouth daily. 01/10/21   [name], NP  ?ReliOn Lancet Devices 30G MISC 1 each by Does not apply route 3 (three) times daily. ?Patient not taking: No sig reported 12/11/18   Asencion Noble, MD  ? ?Physical Exam: ?Vitals:  ? 06/08/21 0730 06/08/21 0830 06/08/21 1000 06/08/21 1025  ?BP: (!) 215/86 (!) 206/92 135/77 (!) 150/80  ?Pulse: 96 69 66 66  ?Resp: (!) '25 17 17 19  '$ ?Temp:    98.1 ?F (36.7 ?C)  ?TempSrc:      ?SpO2: 100% 98% 97% 99%  ?Weight:      ?Height:      ? ?Physical Exam ?Vitals and nursing note reviewed.  ?Constitutional:   ?   Appearance: Normal appearance. She is obese.  ?HENT:  ?   Head: Normocephalic.  ?   Mouth/Throat:  ?   Mouth: Mucous membranes are moist.  ?Eyes:  ?   General: No scleral icterus. ?   Conjunctiva/sclera:  ?   Right eye: Right conjunctiva is injected.  ?   Pupils: Pupils are equal, round, and reactive to light.  ?Neck:  ?   Vascular: No JVD.  ?Cardiovascular:  ?   Rate and Rhythm: Normal rate and regular rhythm.  ?   Heart sounds: S1  normal and S2 normal.  ?Pulmonary:  ?   Effort: Pulmonary effort is normal.  ?   Breath sounds: Normal breath sounds.  ?Abdominal:  ?   General: There is no distension.  ?   Palpations: Abdomen is soft.  ?   Tenderness: There is no abdominal tenderness. There is no guarding.  ?Musculoskeletal:  ?   Cervical back: Neck supple.  ?   Right lower leg: No edema.  ?   Left lower leg: No edema.  ?Skin: ?   General: Skin is warm and dry.  ?Neurological:  ?   General: No focal deficit present.  ?   Mental Status: She is alert and oriented to person, place, and time.  ?Psychiatric:     ?   Mood and  Affect: Mood normal.     ?   Behavior: Behavior normal.  ? ?Data Reviewed: ? ?There are no new results to review at this time. ? ?Assessment and Plan: ?Principal Problem: ?  Hypertensive urgency ?Observation/telemetry. ?Antihypertensives resumed in ED. ?Continue amlodipine 10 mg daily. ?Continue carvedilol 12.5 mg twice daily. ?Continue lisinopril 20 mg p.o. daily. ?Monitor blood pressure and heart rate. ?Monitor renal function electrolytes. ?As needed antihypertensive for SBP > 169 mmHg. ? ?Active Problems: ?  Acute gastroenteritis ?Resolving. ?Supportive care. ?Symptoms management. ?Continue gentle IV hydration. ? ?  Type 2 diabetes mellitus with hyperglycemia ([place]) ?Carbohydrate modified diet. ?Continue gentle IV hydration with NaCl 0.45% solution. ?Resume glimepiride 4 mg p.o. daily. ?Resume metformin 1000 mg p.o. twice daily. ?CBG monitoring with [facility] SS. ? ?  Hyperlipidemia associated with type 2 diabetes mellitus ([place]) ?Resume pravastatin 40 mg p.o. daily. ? ?  Right kidney mass ?Seen by urology in December 2021. ?They recommended regular F/up. ?Needs urology clinic appointment on discharge. ? ?  Class 2 obesity ?Lifestyle modifications.  On follow-up with PCP. ? ? ? ? ? Advance Care Planning:   Code Status: Full Code  ? ?Consults:  ? ?Family Communication:  ? ?Severity of Illness: ?The appropriate patient status for this  patient is OBSERVATION. Observation status is judged to be reasonable and necessary in order to provide the required intensity of service to ensure the patient's safety. The patient's presenting symptoms, phys

## 2021-06-08 NOTE — ED Provider Notes (Addendum)
[facility]-EMERGENCY DEPT Provider Note   CSN: [id number] Arrival date & time: 06/08/21  0424     History  Chief Complaint  Patient presents with   Emesis    Hannah Dougherty is a 51 y.o. female.  Patient presents to the emergency department for evaluation of nausea, vomiting, diarrhea.  Patient reports symptoms for 1 day.  She has had some cramping of the upper abdomen associated with the symptoms.  She denies chest pain, shortness of breath.  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  She has not noticed any fever.  Patient does have a history of hypertension, diabetes but does not have insurance and has not been taking her medications.      Home Medications Prior to Admission medications   Medication Sig Start Date End Date Taking? Authorizing Provider  amLODipine (NORVASC) 10 MG tablet TAKE 1 TABLET (10 MG TOTAL) BY MOUTH DAILY. 01/07/21 01/07/22  Grayce Sessions, NP  carvedilol (COREG) 12.5 MG tablet Take 1 tablet (12.5 mg total) by mouth 2 (two) times daily with a meal. 01/07/21   Grayce Sessions, NP  Dulaglutide (TRULICITY) 1.5 MG/0.5ML SOPN Inject 1.5 mg into the skin once a week. Patient not taking: Reported on 01/28/2021 01/07/21   Grayce Sessions, NP  glimepiride (AMARYL) 4 MG tablet Take 1 tablet (4 mg total) by mouth daily before breakfast. 01/07/21   Grayce Sessions, NP  glucose blood (RELION CONFIRM/MICRO TEST) test strip Use as instructed Patient not taking: Reported on 09/18/2020 12/11/18   Claudean Severance, MD  hydrochlorothiazide (HYDRODIURIL) 25 MG tablet Take 1 tablet (25 mg total) by mouth daily. 01/07/21   Grayce Sessions, NP  lisinopril (ZESTRIL) 20 MG tablet TAKE 1 TABLET (20 MG TOTAL) BY MOUTH DAILY. 01/07/21 01/07/22  Grayce Sessions, NP  metFORMIN (GLUCOPHAGE) 1000 MG tablet Take 1 tablet (1,000 mg total) by mouth 2 (two) times daily with a meal. Patient not taking:  Reported on 01/28/2021 01/07/21   Grayce Sessions, NP  nicotine (NICODERM CQ) 14 mg/24hr patch Place 1 patch (14 mg total) onto the skin daily. Patient not taking: Reported on 01/28/2021 01/07/21   Grayce Sessions, NP  nicotine (NICODERM CQ) 21 mg/24hr patch Place 1 patch (21 mg total) onto the skin daily. Patient not taking: Reported on 01/28/2021 01/07/21   Grayce Sessions, NP  nicotine (NICODERM CQ) 7 mg/24hr patch Place 1 patch (7 mg total) onto the skin daily. Patient not taking: Reported on 01/28/2021 01/07/21   Grayce Sessions, NP  pravastatin (PRAVACHOL) 40 MG tablet Take 1 tablet (40 mg total) by mouth daily. 01/10/21   Grayce Sessions, NP  ReliOn Lancet Devices 30G MISC 1 each by Does not apply route 3 (three) times daily. Patient not taking: No sig reported 12/11/18   Claudean Severance, MD      Allergies    Patient has no known allergies.    Review of Systems   Review of Systems  Gastrointestinal:  Positive for diarrhea, nausea and vomiting.   Physical Exam Updated Vital Signs BP (!) 210/87   Pulse 89   Temp 97.9 F (36.6 C) (Oral)   Resp (!) 25   Ht 5\' 5"  (1.651 m)   Wt 102 kg   LMP 10/28/2016 (Within Weeks)   SpO2 100%   BMI 37.42 kg/m  Physical Exam Vitals and nursing note reviewed.  Constitutional:  General: She is not in acute distress.    Appearance: She is well-developed.  HENT:     Head: Normocephalic and atraumatic.     Mouth/Throat:     Mouth: Mucous membranes are moist.  Eyes:     General: Vision grossly intact. Gaze aligned appropriately.     Extraocular Movements: Extraocular movements intact.     Conjunctiva/sclera: Conjunctivae normal.  Cardiovascular:     Rate and Rhythm: Normal rate and regular rhythm.     Pulses: Normal pulses.     Heart sounds: Normal heart sounds, S1 normal and S2 normal. No murmur heard.   No friction rub. No gallop.  Pulmonary:     Effort: Pulmonary effort is normal. No respiratory distress.      Breath sounds: Normal breath sounds.  Abdominal:     General: Bowel sounds are normal.     Palpations: Abdomen is soft.     Tenderness: There is abdominal tenderness in the epigastric area. There is no guarding or rebound.     Hernia: No hernia is present.  Musculoskeletal:        General: No swelling.     Cervical back: Full passive range of motion without pain, normal range of motion and neck supple. No spinous process tenderness or muscular tenderness. Normal range of motion.     Right lower leg: No edema.     Left lower leg: No edema.  Skin:    General: Skin is warm and dry.     Capillary Refill: Capillary refill takes less than 2 seconds.     Findings: No ecchymosis, erythema, rash or wound.  Neurological:     General: No focal deficit present.     Mental Status: She is alert and oriented to person, place, and time.     GCS: GCS eye subscore is 4. GCS verbal subscore is 5. GCS motor subscore is 6.     Cranial Nerves: Cranial nerves 2-12 are intact.     Sensory: Sensation is intact.     Motor: Motor function is intact.     Coordination: Coordination is intact.  Psychiatric:        Attention and Perception: Attention normal.        Mood and Affect: Mood normal.        Speech: Speech normal.        Behavior: Behavior normal.    ED Results / Procedures / Treatments   Labs (all labs ordered are listed, but only abnormal results are displayed) Labs Reviewed  CBC WITH DIFFERENTIAL/PLATELET - Abnormal; Notable for the following components:      Result Value   WBC 11.4 (*)    RBC 5.24 (*)    Neutro Abs 9.3 (*)    All other components within normal limits  COMPREHENSIVE METABOLIC PANEL - Abnormal; Notable for the following components:   Sodium 131 (*)    Chloride 92 (*)    Glucose, Bld 412 (*)    Creatinine, Ser 1.14 (*)    Total Protein 8.3 (*)    GFR, Estimated 58 (*)    All other components within normal limits  LIPASE, BLOOD - Abnormal; Notable for the following  components:   Lipase 76 (*)    All other components within normal limits  URINALYSIS, ROUTINE W REFLEX MICROSCOPIC  TROPONIN I (HIGH SENSITIVITY)  TROPONIN I (HIGH SENSITIVITY)    EKG EKG Interpretation  Date/Time:  Sunday June 08 2021 04:52:06 EDT Ventricular Rate:  64 PR Interval:  123 QRS Duration: 99 QT Interval:  460 QTC Calculation: 475 R Axis:   46 Text Interpretation: Sinus rhythm Confirmed by Gilda Crease [phone]) on 06/08/2021 5:46:45 AM  Radiology No results found.  Procedures Procedures    Medications Ordered in ED Medications  insulin aspart (novoLOG) injection 15 Units (has no administration in time range)  ondansetron (ZOFRAN) injection 4 mg (has no administration in time range)  sodium chloride 0.9 % bolus 1,000 mL (has no administration in time range)  amLODipine (NORVASC) tablet 10 mg (has no administration in time range)  carvedilol (COREG) tablet 12.5 mg (has no administration in time range)  lisinopril (ZESTRIL) tablet 20 mg (has no administration in time range)  hydrALAZINE (APRESOLINE) injection 20 mg (20 mg Intravenous Given 06/08/21 0534)    ED Course/ Medical Decision Making/ A&P                           Medical Decision Making Amount and/or Complexity of Data Reviewed Labs: ordered. Radiology: ordered.  Risk Prescription drug management.   Presents to the emergency department for evaluation of nausea, vomiting, diarrhea.  Differential diagnosis considered is broad, includes gastroenteritis, cholecystitis, small bowel obstruction, diverticulitis, MI.  Patient noted to be very hypertensive at arrival.  Patient has a history of essential hypertension and diabetes, has been off of all her medications for an unknown period of time.  This is secondary to lack of medical insurance.  Patient hyperglycemic as well.  Patient experiencing headache.  No focal neurologic deficits on exam but due to her marked blood pressure elevation,  will perform CT head to further evaluate.  EKG at arrival does not suggest ischemia or infarct.  Patient given IV hydralazine for her significantly elevated blood pressure.  She was given IV fluids and Zofran for nausea, vomiting.  She was then given her routine antihypertensives.  Patient administered subcutaneous insulin for her hyperglycemia.  Work-up does not suggest diabetic ketoacidosis or hyperosmolar state.  Patient will undergo CT abdomen and pelvis to further evaluate her abdominal pain, nausea, vomiting, diarrhea with significant hypertension and hyperglycemia.  Will sign out to oncoming ER physician to follow-up on results of CT scan, second troponin, blood pressure and blood sugar improvements.  Anticipate discharge if no significant findings are present.  May need social work help to obtain medications.        Final Clinical Impression(s) / ED Diagnoses Final diagnoses:  Nausea vomiting and diarrhea  Primary hypertension  Hyperglycemia    Rx / DC Orders ED Discharge Orders     None         Gerik Coberly, Canary Brim, MD 06/08/21 6045    Gilda Crease, MD 06/08/21 [phone]

## 2021-06-09 DIAGNOSIS — I160 Hypertensive urgency: Secondary | ICD-10-CM | POA: Diagnosis not present

## 2021-06-09 LAB — GLUCOSE, CAPILLARY
Glucose-Capillary: 217 mg/dL — ABNORMAL HIGH (ref 70–99)
Glucose-Capillary: 242 mg/dL — ABNORMAL HIGH (ref 70–99)
Glucose-Capillary: 186 mg/dL — ABNORMAL HIGH (ref 70–99)
Glucose-Capillary: 191 mg/dL — ABNORMAL HIGH (ref 70–99)

## 2021-06-09 MED ORDER — MELATONIN 3 MG PO TABS
3.0000 mg | ORAL_TABLET | Freq: Every day | ORAL | Status: DC
  Administered 2021-06-09 – 2021-06-12 (×4): 3 mg via ORAL
  Filled 2021-06-09 (×4): qty 1

## 2021-06-09 MED ORDER — ORAL CARE MOUTH RINSE
15.0000 mL | Freq: Two times a day (BID) | OROMUCOSAL | Status: DC
  Administered 2021-06-09 – 2021-06-13 (×8): 15 mL via OROMUCOSAL

## 2021-06-09 MED ORDER — POLYETHYLENE GLYCOL 3350 17 G PO PACK
17.0000 g | PACK | Freq: Every day | ORAL | Status: DC
  Filled 2021-06-09 (×4): qty 1

## 2021-06-09 MED ORDER — PANTOPRAZOLE SODIUM 40 MG PO TBEC
40.0000 mg | DELAYED_RELEASE_TABLET | Freq: Every day | ORAL | Status: DC
  Administered 2021-06-09 – 2021-06-13 (×5): 40 mg via ORAL
  Filled 2021-06-09 (×5): qty 1

## 2021-06-09 MED ORDER — HYDROCHLOROTHIAZIDE 25 MG PO TABS
25.0000 mg | ORAL_TABLET | Freq: Every day | ORAL | Status: DC
  Administered 2021-06-09: 25 mg via ORAL
  Filled 2021-06-09: qty 1

## 2021-06-09 MED ORDER — INSULIN GLARGINE-YFGN 100 UNIT/ML ~~LOC~~ SOLN
20.0000 [IU] | Freq: Every day | SUBCUTANEOUS | Status: DC
  Administered 2021-06-09: 20 [IU] via SUBCUTANEOUS
  Filled 2021-06-09 (×2): qty 0.2

## 2021-06-09 MED ORDER — LISINOPRIL 20 MG PO TABS
40.0000 mg | ORAL_TABLET | Freq: Every day | ORAL | Status: DC
  Administered 2021-06-09 – 2021-06-10 (×2): 40 mg via ORAL
  Filled 2021-06-09 (×2): qty 2

## 2021-06-09 NOTE — Progress Notes (Addendum)
?PROGRESS NOTE ? ? ? ?Hannah Dougherty  UUV:[id number] DOB: 08/15/1970 DOA: 06/08/2021 ?PCP: [name], NP  ? ?  ?Brief Narrative:  ?Hannah Dougherty is a 51 y.o. female with medical history significant of class II obesity, type II DM, hypertension, hyperlipidemia, right kidney mass, active tobacco use who came into the emergency department due to multiple episodes of nausea, emesis and diarrhea since yesterday associated with abdominal cramping. She had not been taking her regular medications due to lack of insurance coverage.  She was found to have elevated blood pressure 215/204 and she was admitted for hypertensive urgency. ? ?New events last 24 hours / Subjective: ?Patient sleeping this morning.  Denies any further vomiting diarrhea.  States that she now has insurance and can now afford her medications.  Has been off insulin as well. ? ?Assessment & Plan: ?  ?Principal Problem: ?  Hypertensive urgency ?Active Problems: ?  Hyperlipidemia associated with type 2 diabetes mellitus ([place]) ?  Right kidney mass ?  Type 2 diabetes mellitus with hyperglycemia ([facility]) ?  Class 2 obesity ?  Acute gastroenteritis ? ? ?Hypertensive urgency ?-Improved but still remains elevated ?-Continue amlodipine, Coreg, HCTZ, lisinopril.  Doses adjusted today  ? ?Uncontrolled type 2 diabetes with hyperglycemia ?-A1c 13.2 ?-Semglee, SSI  ? ?CKD stage IIIa ?-Stable ? ?Acute gastroenteritis ?-Advance diet, improved ? ?Right kidney mass ?-CT A/P: 2.9 x 2.1 x 2.4 cm enhancing lesion in the lower pole of the right kidney is compatible with a slow growing neoplasm, slightly enlarged compared to the prior study, likely a renal cell carcinoma. This remains encapsulated within Gerota's fascia and is separate from the right renal vein which is widely patent. No lymphadenopathy or definite signs of metastatic disease on today's examination. Nonemergent outpatient referral to Urology for further clinical management is  recommended. ? ?Hyperlipidemia ?-Pravachol ? ?GERD ?-PPI ? ?Tobacco abuse ?-Nicotine patch  ? ?Obesity ?Estimated body mass index is 35.08 kg/m? as calculated from the following: ?  Height as of this encounter: '5\' 5"'$  (1.651 m). ?  Weight as of this encounter: 95.6 kg. ? ? ? ?DVT prophylaxis:  ?SCDs Start: 06/08/21 1031 ? ?Code Status: Full ?Family Communication: None at bedside ?Disposition Plan:  ?Status is: Observation ?The patient will require care spanning > 2 midnights and should be moved to inpatient because: remains with elevated BP and BS.Need further medication  management. Advance diet today.  ? ?Antimicrobials:  ?Anti-infectives (From admission, onward)  ? ? None  ? ?  ? ? ? ?Objective: ?Vitals:  ? 06/09/21 0206 06/09/21 4742 06/09/21 0733 06/09/21 1106  ?BP: (!) 155/70 (!) 173/89 (!) 188/93 (!) 176/84  ?Pulse: 60 (!) 59 (!) 59 64  ?Resp: 14 16    ?Temp: 98.1 ?F (36.7 ?C) 98.6 ?F (37 ?C)    ?TempSrc: Oral Oral    ?SpO2: 97% 99%  99%  ?Weight:      ?Height:      ? ? ?Intake/Output Summary (Last 24 hours) at 06/09/2021 1203 ?Last data filed at 06/09/2021 0319 ?Gross per 24 hour  ?Intake 956.6 ml  ?Output --  ?Net 956.6 ml  ? ?Filed Weights  ? 06/08/21 0435 06/08/21 1356  ?Weight: 102 kg 95.6 kg  ? ? ?Examination:  ?General exam: Appears calm and comfortable  ?Respiratory system: Clear to auscultation. Respiratory effort normal. No respiratory distress. No conversational dyspnea.  ?Cardiovascular system: S1 & S2 heard, RRR. No murmurs. No pedal edema. ?Gastrointestinal system: Abdomen is nondistended, soft and nontender. Normal bowel  sounds heard. ?Central nervous system: Alert and oriented. No focal neurological deficits. Speech clear.  ?Extremities: Symmetric in appearance  ?Skin: No rashes, lesions or ulcers on exposed skin  ?Psychiatry: Judgement and insight appear normal. Mood & affect appropriate.  ? ?Data Reviewed: I have personally reviewed following labs and imaging studies ? ?CBC: ?Recent Labs  ?Lab  06/08/21 ?0446  ?WBC 11.4*  ?NEUTROABS 9.3*  ?HGB 13.9  ?HCT 42.9  ?MCV 81.9  ?PLT 252  ? ?Basic Metabolic Panel: ?Recent Labs  ?Lab 06/08/21 ?0446 06/08/21 ?0258  ?NA 131*  --   ?K 3.7  --   ?CL 92*  --   ?CO2 25  --   ?GLUCOSE 412*  --   ?BUN 17  --   ?CREATININE 1.14*  --   ?CALCIUM 9.2  --   ?MG  --  1.7  ? ?GFR: ?Estimated Creatinine Clearance: 65.2 mL/min (A) (by C-G formula based on SCr of 1.14 mg/dL (H)). ?Liver Function Tests: ?Recent Labs  ?Lab 06/08/21 ?0446  ?AST 18  ?ALT 18  ?ALKPHOS 96  ?BILITOT 0.6  ?PROT 8.3*  ?ALBUMIN 4.1  ? ?Recent Labs  ?Lab 06/08/21 ?0446  ?LIPASE 76*  ? ?No results for input(s): AMMONIA in the last 168 hours. ?Coagulation Profile: ?No results for input(s): INR, PROTIME in the last 168 hours. ?Cardiac Enzymes: ?No results for input(s): CKTOTAL, CKMB, CKMBINDEX, TROPONINI in the last 168 hours. ?BNP (last 3 results) ?No results for input(s): PROBNP in the last 8760 hours. ?HbA1C: ?Recent Labs  ?  06/08/21 ?0446  ?HGBA1C 13.2*  ? ?CBG: ?Recent Labs  ?Lab 06/08/21 ?1301 06/08/21 ?1652 06/08/21 ?2130 06/09/21 ?5277 06/09/21 ?1115  ?GLUCAP 178* 141* 198* 217* 242*  ? ?Lipid Profile: ?No results for input(s): CHOL, HDL, LDLCALC, TRIG, CHOLHDL, LDLDIRECT in the last 72 hours. ?Thyroid Function Tests: ?No results for input(s): TSH, T4TOTAL, FREET4, T3FREE, THYROIDAB in the last 72 hours. ?Anemia Panel: ?No results for input(s): VITAMINB12, FOLATE, FERRITIN, TIBC, IRON, RETICCTPCT in the last 72 hours. ?Sepsis Labs: ?No results for input(s): PROCALCITON, LATICACIDVEN in the last 168 hours. ? ?No results found for this or any previous visit (from the past 240 hour(s)).  ? ? ?Radiology Studies: ?CT HEAD WO CONTRAST (5MM) ? ?Result Date: 06/08/2021 ?CLINICAL DATA:  51 year old female with history of new onset of worsening headache. Vomiting. EXAM: CT HEAD WITHOUT CONTRAST TECHNIQUE: Contiguous axial images were obtained from the base of the skull through the vertex without intravenous  contrast. RADIATION DOSE REDUCTION: This exam was performed according to the departmental dose-optimization program which includes automated exposure control, adjustment of the mA and/or kV according to patient size and/or use of iterative reconstruction technique. COMPARISON:  Head CT 02/18/2020. FINDINGS: Brain: No evidence of acute infarction, hemorrhage, hydrocephalus, extra-axial collection or mass lesion/mass effect. Vascular: No hyperdense vessel or unexpected calcification. Skull: Normal. Negative for fracture or focal lesion. Sinuses/Orbits: No acute finding. Other: None. IMPRESSION: 1. No acute intracranial abnormalities. The appearance of the brain is normal. Electronically Signed   By: Vinnie Langton M.D.   On: 06/08/2021 07:38  ? ?CT ABDOMEN PELVIS W CONTRAST ? ?Result Date: 06/08/2021 ?CLINICAL DATA:  51 year old female with history of acute onset of nonlocalized abdominal pain. EXAM: CT ABDOMEN AND PELVIS WITH CONTRAST TECHNIQUE: Multidetector CT imaging of the abdomen and pelvis was performed using the standard protocol following bolus administration of intravenous contrast. RADIATION DOSE REDUCTION: This exam was performed according to the departmental dose-optimization program which includes automated exposure control, adjustment  of the mA and/or kV according to patient size and/or use of iterative reconstruction technique. CONTRAST:  18m OMNIPAQUE IOHEXOL 300 MG/ML  SOLN COMPARISON:  CT the abdomen and pelvis 09/10/2020. FINDINGS: Lower chest: Unremarkable. Hepatobiliary: No suspicious cystic or solid hepatic lesions. No intra or extrahepatic biliary ductal dilatation. Gallbladder is normal in appearance. Pancreas: No pancreatic mass. No pancreatic ductal dilatation. No pancreatic or peripancreatic fluid collections or inflammatory changes. Spleen: Unremarkable. Adrenals/Urinary Tract: In the lower pole of the right kidney (axial image 46 of series 2 and coronal image 76 of series 4) there is a  partially exophytic 2.9 x 2.1 x 2.4 cm heterogeneously enhancing lesion, highly suspicious for renal cell carcinoma. This is encapsulated within Gerota's fascia and is well separated from the right renal vein which is widely patent. Left kid

## 2021-06-09 NOTE — Progress Notes (Signed)
Inpatient Diabetes Program Recommendations ? ?AACE/ADA: New Consensus Statement on Inpatient Glycemic Control (2015) ? ?Target Ranges:  Prepandial:   less than 140 mg/dL ?     Peak postprandial:   less than 180 mg/dL (1-2 hours) ?     Critically ill patients:  140 - 180 mg/dL  ? ?Lab Results  ?Component Value Date  ? GLUCAP 242 (H) 06/09/2021  ? HGBA1C 13.2 (H) 06/08/2021  ? ? ?Review of Glycemic Control ? ?Diabetes history: DM 2 ?Outpatient Diabetes medications: Trulicity 1.5 mg weekly, Amaryl 4 mg Daily ?Current orders for Inpatient glycemic control:  ?Metformin 1000 mg bid ?Amaryl 4 mg Daily ?Novolog 0-20 units tid ? ?Inpatient Diabetes Program Recommendations:   ? ?Spoke with pt at bedside regarding A1c of 13.2% and glucose control at home. Pt reports having a lapse in insurance and was unable to get her medications. He car was also taken from her which had a lot of her DM supplies in it. She does not have transportation to appts as she lives in [place] with her daughter (who also does not have a car). Pt reports having side effects from metformin. At baseline while on medication, pt reports having an A1c around a 7%. ? ?Pt sees Rowan Blase as PCP and last saw her 2 months ago. Her next appt is next month and she sees her every 3 months. ? ?Pt reports having [employer] insurance currently and is unsure how it covers medication costs. ? ?D/c needs: ?Glucose meter kit order #[id number] ?Pt reports side effects to metformin and says she does not take it ?Amaryl 4 mg Daily ? ?Thanks, ? ?Tama Headings RN, MSN, BC-ADM ?Inpatient Diabetes Coordinator ?Team Pager [phone] (8a-5p) ? ? ?

## 2021-06-10 ENCOUNTER — Observation Stay (HOSPITAL_COMMUNITY): Payer: 59

## 2021-06-10 DIAGNOSIS — I160 Hypertensive urgency: Secondary | ICD-10-CM | POA: Diagnosis not present

## 2021-06-10 LAB — GLUCOSE, CAPILLARY
Glucose-Capillary: 211 mg/dL — ABNORMAL HIGH (ref 70–99)
Glucose-Capillary: 173 mg/dL — ABNORMAL HIGH (ref 70–99)
Glucose-Capillary: 103 mg/dL — ABNORMAL HIGH (ref 70–99)
Glucose-Capillary: 248 mg/dL — ABNORMAL HIGH (ref 70–99)
Glucose-Capillary: 76 mg/dL (ref 70–99)
Glucose-Capillary: 141 mg/dL — ABNORMAL HIGH (ref 70–99)
Glucose-Capillary: 137 mg/dL — ABNORMAL HIGH (ref 70–99)
Glucose-Capillary: 150 mg/dL — ABNORMAL HIGH (ref 70–99)

## 2021-06-10 LAB — CBC
HCT: 41.8 % (ref 36.0–46.0)
Hemoglobin: 13.6 g/dL (ref 12.0–15.0)
MCH: 27.1 pg (ref 26.0–34.0)
MCHC: 32.5 g/dL (ref 30.0–36.0)
MCV: 83.4 fL (ref 80.0–100.0)
Platelets: 211 10*3/uL (ref 150–400)
RBC: 5.01 MIL/uL (ref 3.87–5.11)
RDW: 14 % (ref 11.5–15.5)
WBC: 6.5 10*3/uL (ref 4.0–10.5)
nRBC: 0 % (ref 0.0–0.2)

## 2021-06-10 LAB — BASIC METABOLIC PANEL
Anion gap: 8 (ref 5–15)
Anion gap: 6 (ref 5–15)
BUN: 22 mg/dL — ABNORMAL HIGH (ref 6–20)
BUN: 23 mg/dL — ABNORMAL HIGH (ref 6–20)
CO2: 26 mmol/L (ref 22–32)
CO2: 24 mmol/L (ref 22–32)
Calcium: 8.8 mg/dL — ABNORMAL LOW (ref 8.9–10.3)
Calcium: 8.1 mg/dL — ABNORMAL LOW (ref 8.9–10.3)
Chloride: 91 mmol/L — ABNORMAL LOW (ref 98–111)
Chloride: 99 mmol/L (ref 98–111)
Creatinine, Ser: 1.06 mg/dL — ABNORMAL HIGH (ref 0.44–1.00)
Creatinine, Ser: 1.4 mg/dL — ABNORMAL HIGH (ref 0.44–1.00)
GFR, Estimated: >60 mL/min (ref >60)
GFR, Estimated: 45 mL/min — ABNORMAL LOW (ref >60)
Glucose, Bld: 194 mg/dL — ABNORMAL HIGH (ref 70–99)
Glucose, Bld: 149 mg/dL — ABNORMAL HIGH (ref 70–99)
Potassium: 3.4 mmol/L — ABNORMAL LOW (ref 3.5–5.1)
Potassium: 3.7 mmol/L (ref 3.5–5.1)
Sodium: 125 mmol/L — ABNORMAL LOW (ref 135–145)
Sodium: 129 mmol/L — ABNORMAL LOW (ref 135–145)

## 2021-06-10 LAB — TSH: TSH: 0.926 u[IU]/mL (ref 0.350–4.500)

## 2021-06-10 LAB — MAGNESIUM: Magnesium: 1.8 mg/dL (ref 1.7–2.4)

## 2021-06-10 LAB — HIV ANTIBODY (ROUTINE TESTING W REFLEX): HIV Screen 4th Generation wRfx: NONREACTIVE

## 2021-06-10 LAB — TROPONIN I (HIGH SENSITIVITY): Troponin I (High Sensitivity): 7 ng/L (ref <18)

## 2021-06-10 IMAGING — MR MR HEAD W/O CM
10 series · 48 of 48 positions shown · non-contrast
Comparison: Comparison made with prior head CT from earlier the
same day.

CLINICAL DATA: Initial evaluation for acute TIA.

EXAM:
MRI HEAD WITHOUT CONTRAST
TECHNIQUE: Multiplanar, multiecho pulse sequences of the brain and surrounding
structures were obtained without intravenous contrast.

[Series 5: DWI · axial · 3.0mm · 1.36mm/px · z∈[-70,+70]mm · 9 of 96 slices shown (1 of 2)]
[im 1/96]
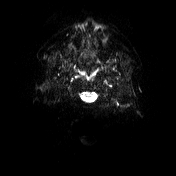
[im 12/96]
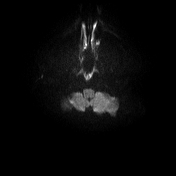
[im 24/96]
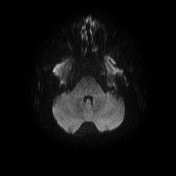
[im 36/96]
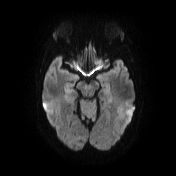
[im 48/96]
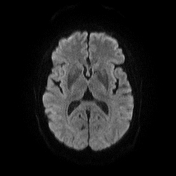
[im 60/96]
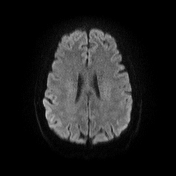
[im 72/96]
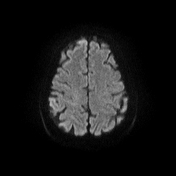
[im 84/96]
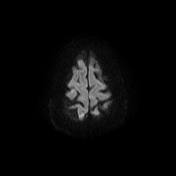
[im 96/96]
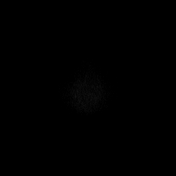

[Series 6: DWI · axial · 3.0mm · 1.36mm/px · z∈[-70,+70]mm · 5 of 47 slices shown (2 of 2)]
[im 1/47]
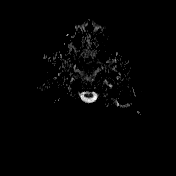
[im 12/47]
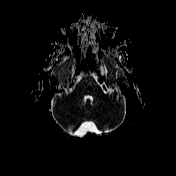
[im 24/47]
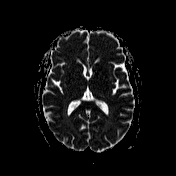
[im 35/47]
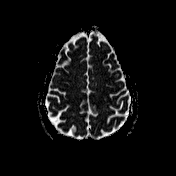
[im 47/47]
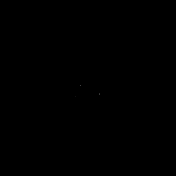

[Series 7: T1 · sagittal · 5.0mm · 0.75mm/px · 2 of 24 slices shown (1 of 2)]
[im 1/24]
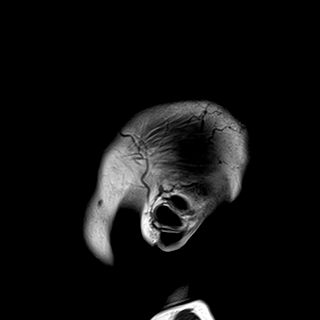
[im 24/24]
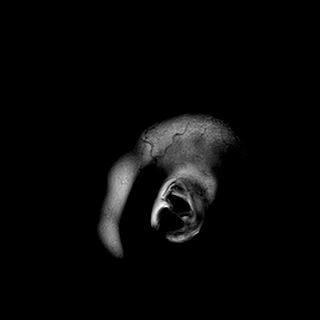

[Series 8: T2 · axial · 5.0mm · 0.62mm/px · z∈[-79,+82]mm · 2 of 26 slices shown (1 of 2)]
[im 1/26]
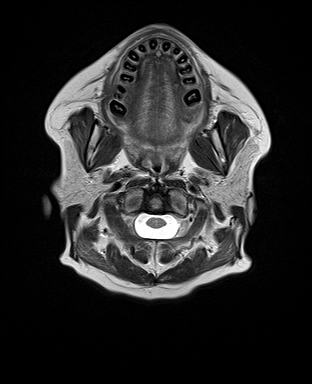
[im 26/26]
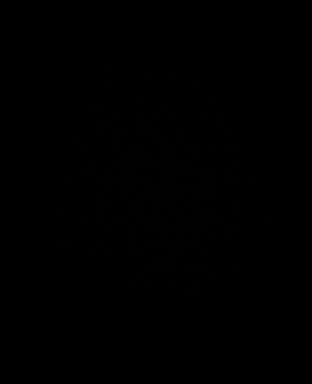

[Series 9: swi_images · axial · 3.0mm · 0.75mm/px · z∈[-80,+83]mm · 5 of 56 slices shown]
[im 1/56]
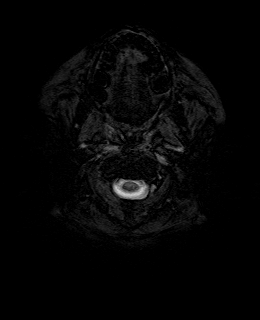
[im 14/56]
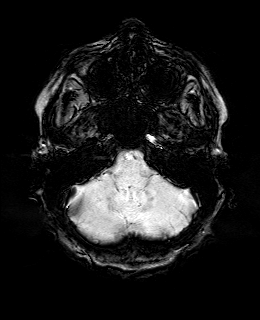
[im 28/56]
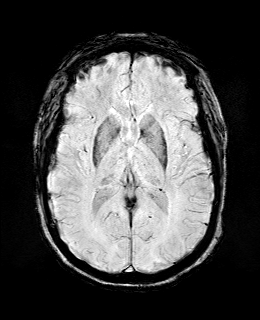
[im 42/56]
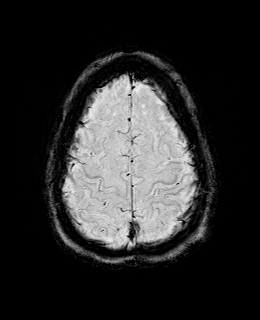
[im 56/56]
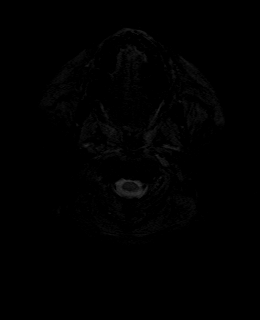

[Series 11: FLAIR · axial · 3.0mm · 0.75mm/px · z∈[-74,+77]mm · 4 of 52 slices shown]
[im 1/52]
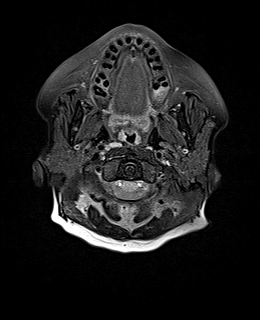
[im 18/52]
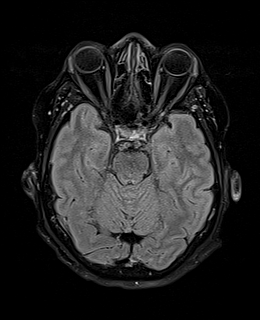
[im 35/52]
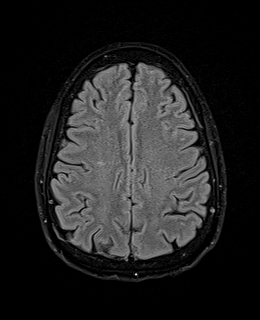
[im 52/52]
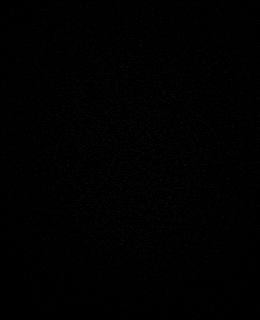

[Series 12: T1 · axial · 1.0mm · 0.94mm/px · z∈[-69,+72]mm · 12 of 144 slices shown (2 of 2)]
[im 1/144]
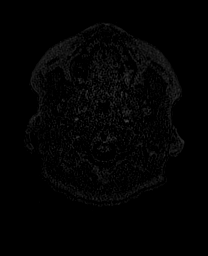
[im 14/144]
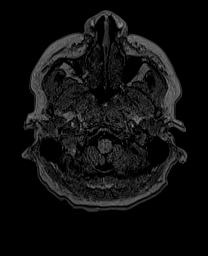
[im 27/144]
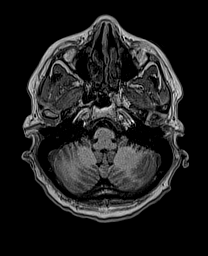
[im 40/144]
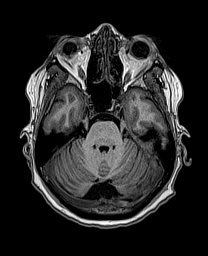
[im 53/144]
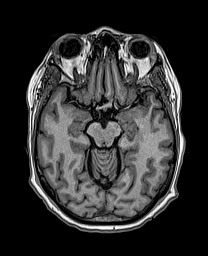
[im 66/144]
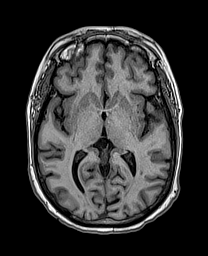
[im 79/144]
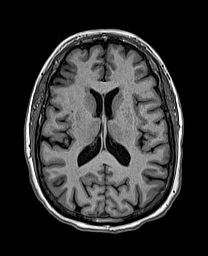
[im 92/144]
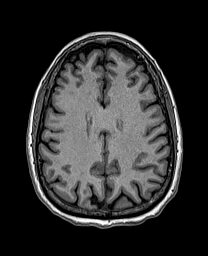
[im 105/144]
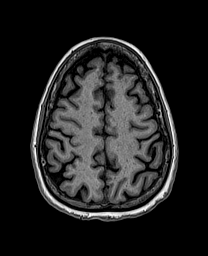
[im 118/144]
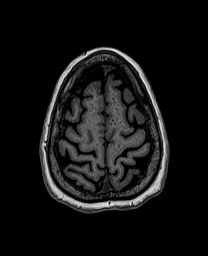
[im 131/144]
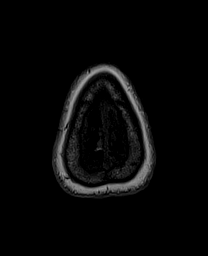
[im 144/144]
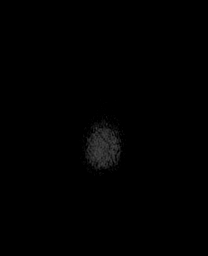

[Series 13: cor dwi_tracew · coronal · 5.0mm · 1.53mm/px · 5 of 56 slices shown]
[im 1/56]
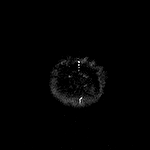
[im 14/56]
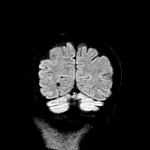
[im 28/56]
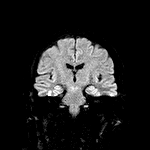
[im 42/56]
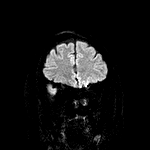
[im 56/56]
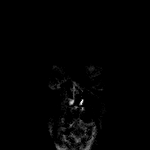

[Series 14: cor dwi_adc · coronal · 5.0mm · 1.53mm/px · 2 of 28 slices shown]
[im 1/28]
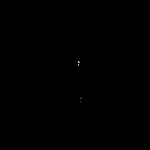
[im 28/28]
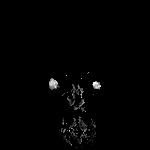

[Series 15: T2 · coronal · 5.0mm · 0.57mm/px · 2 of 28 slices shown (2 of 2)]
[im 1/28]
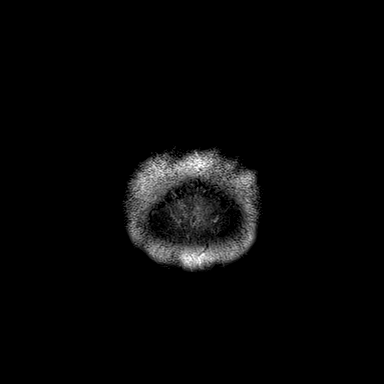
[im 28/28]
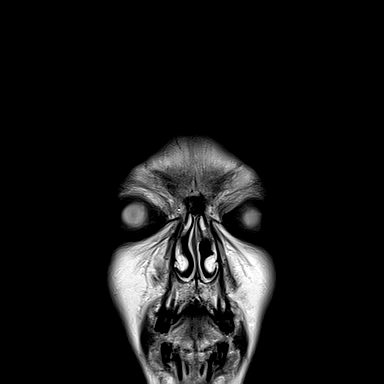

[48 of 48 positions shown; findings below may reference images not displayed]

FINDINGS: Brain: Cerebral volume within normal limits. Scattered patchy
subcentimeter T2/FLAIR hyperintensities noted involving the deep and
subcortical white matter both cerebral hemispheres, nonspecific, but
overall mild in nature. Small foci of encephalomalacia noted at the
occipital poles bilaterally, likely related to prior ischemic
infarcts (series 11, image 23). Associated chronic hemosiderin
staining present at these locations.

No abnormal foci of restricted diffusion to suggest acute or
subacute ischemia. Gray-white matter differentiation otherwise
maintained. No other areas of chronic cortical infarction. No other
acute or chronic intracranial blood products.

No mass lesion, midline shift or mass effect. No hydrocephalus or
extra-axial fluid collection. Pituitary gland suprasellar region
within normal limits. Midline structures intact.

Vascular: Major intracranial vascular flow voids are maintained.

Skull and upper cervical spine: Craniocervical junction within
normal limits. Bone marrow signal intensity within normal limits. No
scalp soft tissue abnormality.

Sinuses/Orbits: Globes and orbital soft tissues demonstrate no acute
finding. Mild scattered mucosal thickening noted throughout the
paranasal sinuses without evidence for acute sinusitis. No mastoid
effusion.

Other: None.
IMPRESSION: 1. No acute intracranial infarct or other abnormality.
2. Small foci of cortical encephalomalacia involving the occipital
lobes bilaterally, likely related to prior ischemic infarcts.
3. Mild cerebral white matter changes, nonspecific, but favored to
be related to chronic microvascular ischemic disease given the
patient history of diabetes and hypertension.

## 2021-06-10 IMAGING — CT CT HEAD W/O CM
3 series · 14 of 46 positions shown, 16 images · non-contrast
Comparison: [DATE] and [DATE]

CLINICAL DATA: Left-sided weakness and facial numbness.



[Series 2: head wo · axial · 0.47mm/px · z∈[-96,+24]mm · 8 of 29 slices shown, 10 images]
[im 3/29  brain]
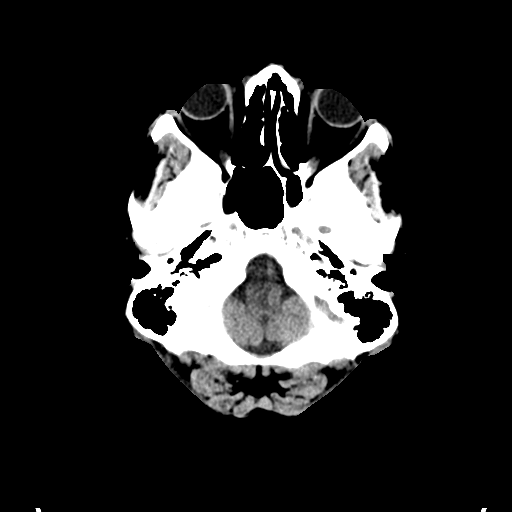
[im 3/29  bone]
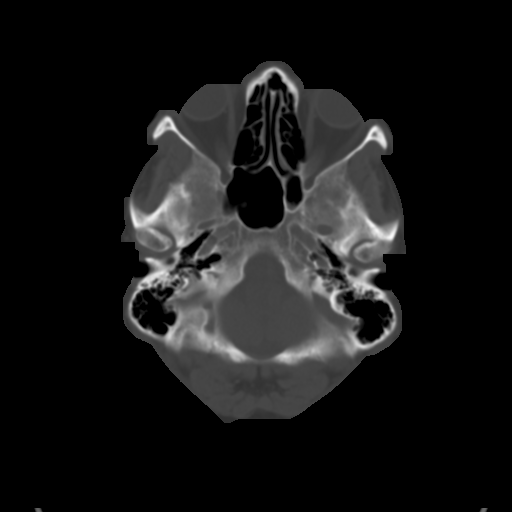
[im 7/29  brain]
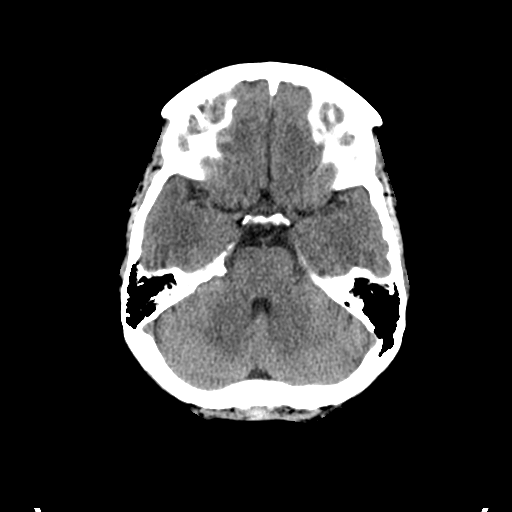
[im 10/29  brain]
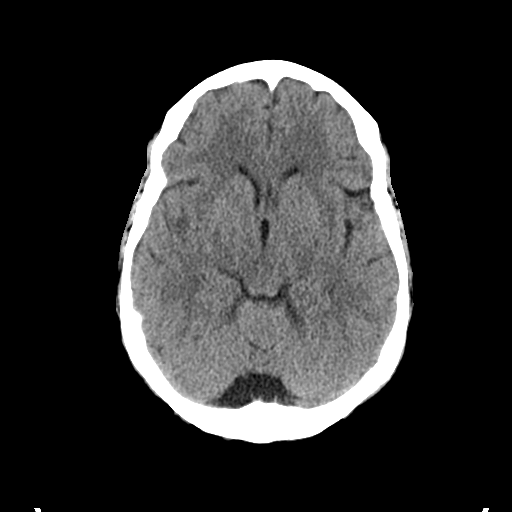
[im 13/29  brain]
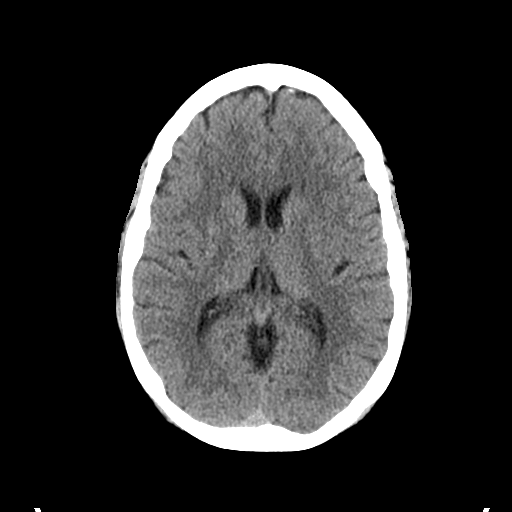
[im 17/29  brain]
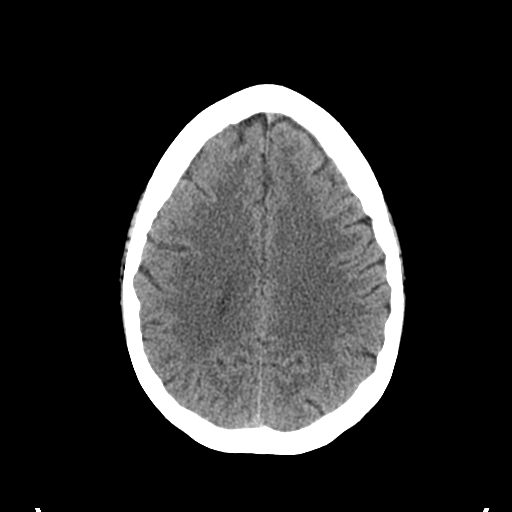
[im 17/29  bone]
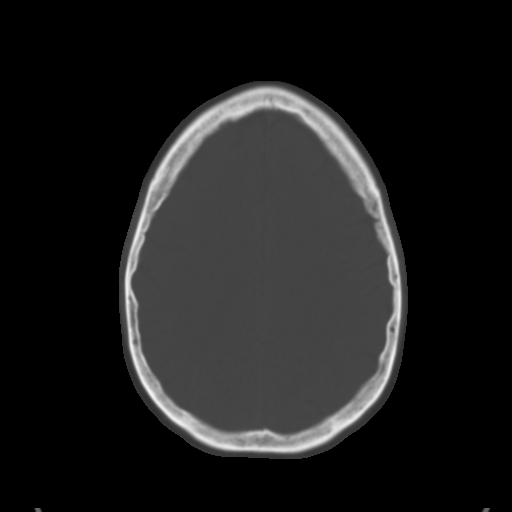
[im 20/29  brain]
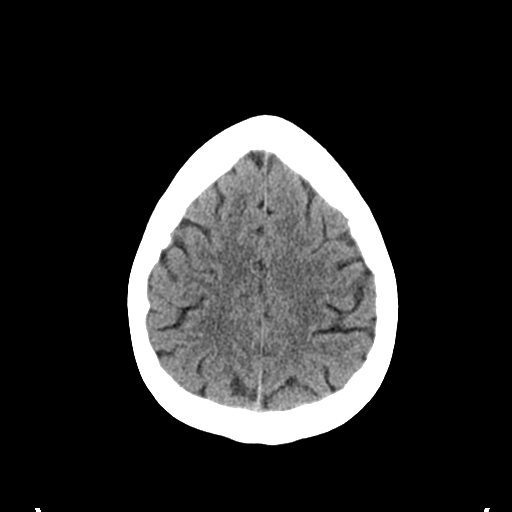
[im 23/29  brain]
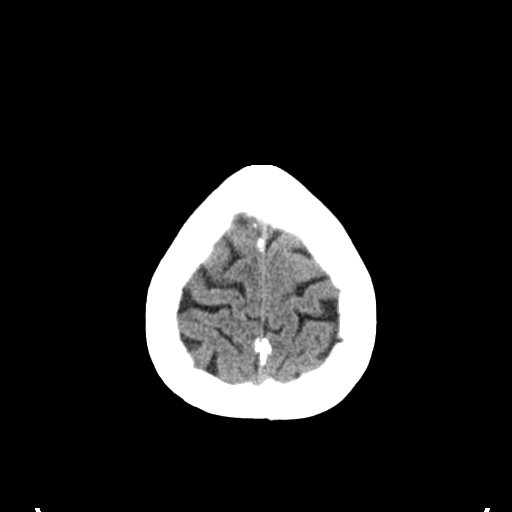
[im 27/29  brain]
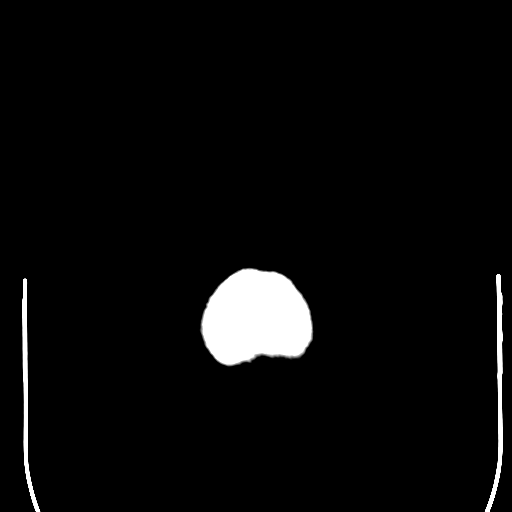

[Series 4: coronal soft tissue · coronal · 0.28mm/px · 3 of 76 slices shown]
[im 26/76  brain]
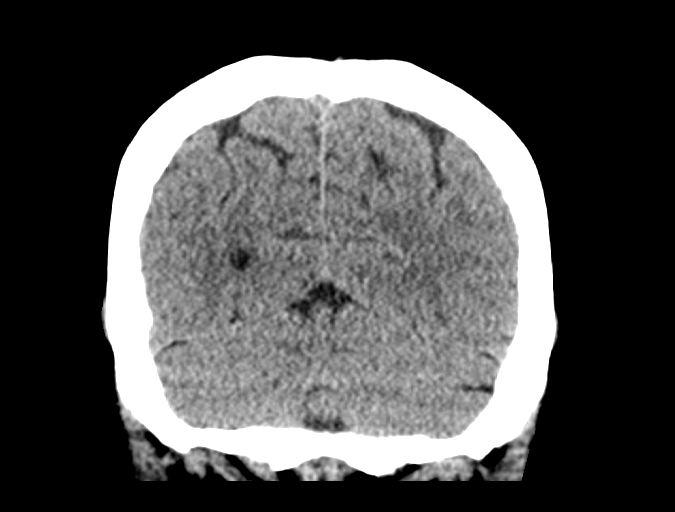
[im 34/76  brain]
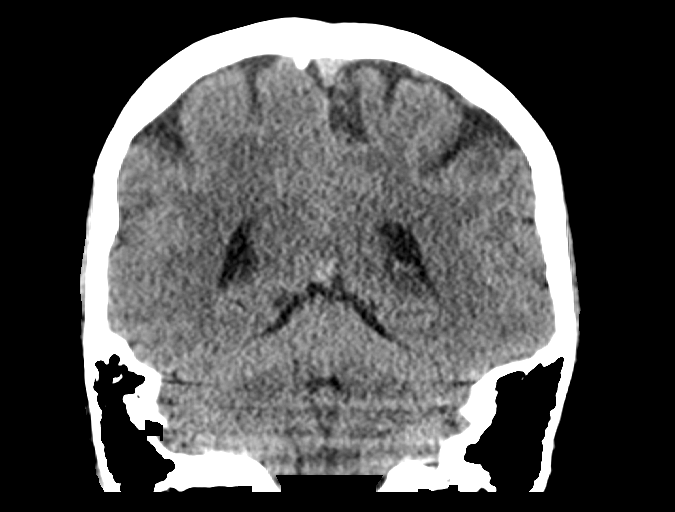
[im 42/76  brain]
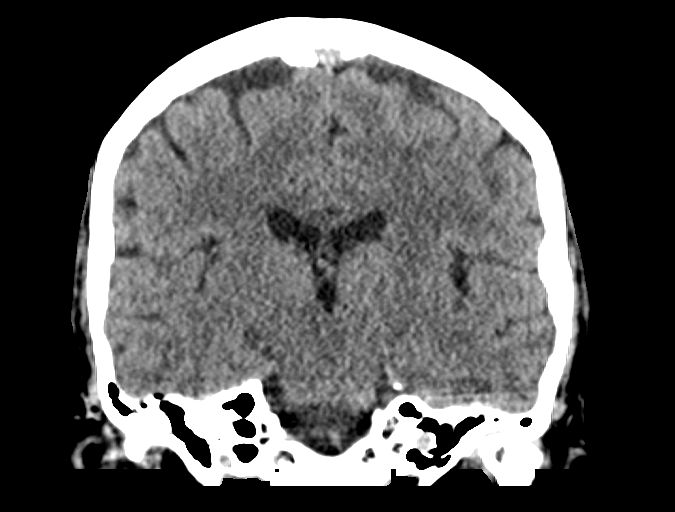

[Series 5: sagittal soft tissue · sagittal · 0.28mm/px · 3 of 63 slices shown]
[im 21/63  brain]
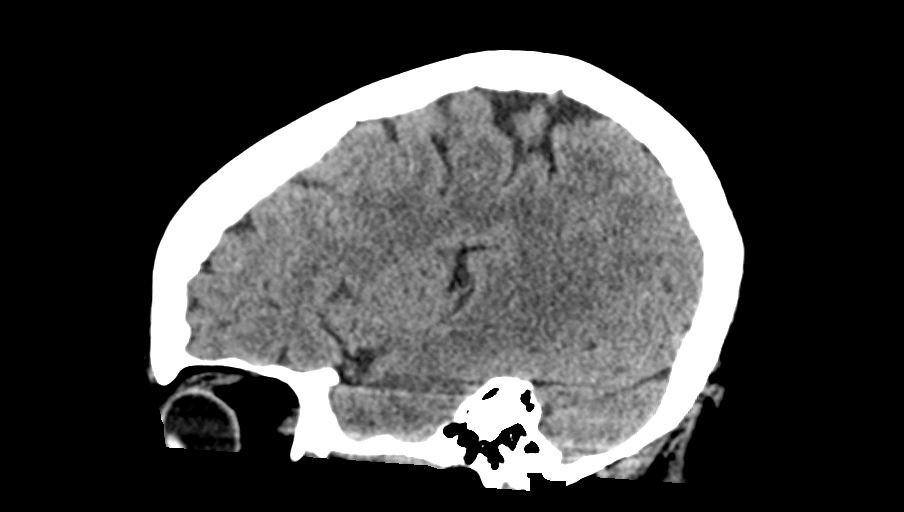
[im 32/63  brain]
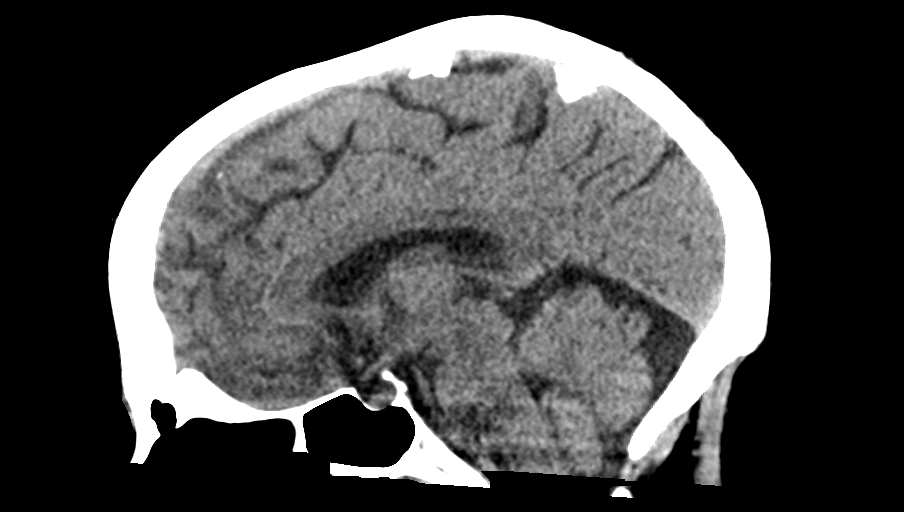
[im 42/63  brain]
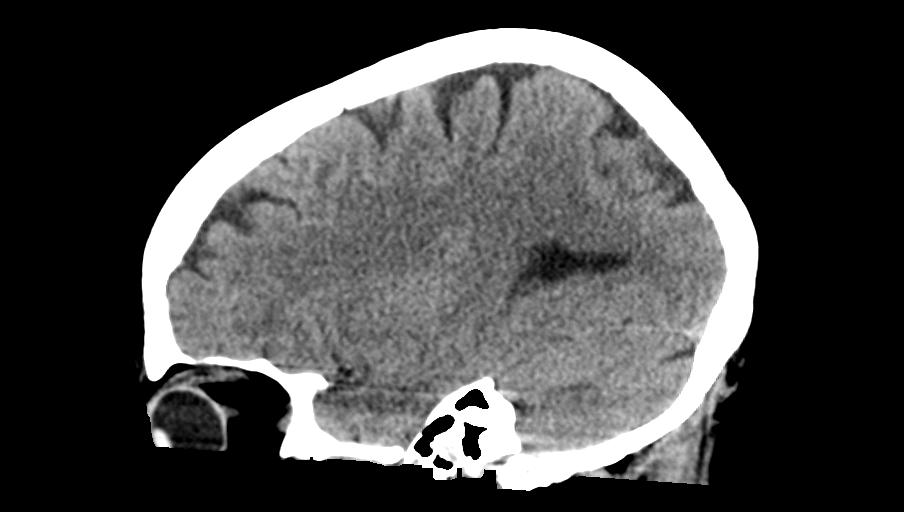

[14 of 46 positions shown; findings below may reference images not displayed]

FINDINGS: Brain: No evidence of acute infarction, hemorrhage, hydrocephalus,
extra-axial collection or mass lesion/mass effect.

A very small, stable, chronic appearing area of left occipital lobe
cortical and white matter low attenuation is seen (axial CT image
15, CT series 2).

Vascular: No hyperdense vessel or unexpected calcification.

Skull: Normal. Negative for fracture or focal lesion.

Sinuses/Orbits: No acute finding.

Other: None.
IMPRESSION: No acute intracranial abnormality.

## 2021-06-10 MED ORDER — HYDROCHLOROTHIAZIDE 25 MG PO TABS: 25.0000 mg | ORAL_TABLET | Freq: Every day | ORAL | Status: DC

## 2021-06-10 MED ORDER — SODIUM CHLORIDE 0.9 % IV BOLUS
1000.0000 mL | Freq: Once | INTRAVENOUS | Status: AC
  Administered 2021-06-10: 1000 mL via INTRAVENOUS

## 2021-06-10 MED ORDER — HYDROCHLOROTHIAZIDE 25 MG PO TABS: 50.0000 mg | ORAL_TABLET | Freq: Every day | ORAL | Status: DC

## 2021-06-10 MED ORDER — LISINOPRIL 20 MG PO TABS
20.0000 mg | ORAL_TABLET | Freq: Every day | ORAL | Status: DC
  Administered 2021-06-11 – 2021-06-13 (×3): 20 mg via ORAL
  Filled 2021-06-10 (×3): qty 1

## 2021-06-10 MED ORDER — SODIUM CHLORIDE 0.9 % IV SOLN: INTRAVENOUS | Status: DC

## 2021-06-10 MED ORDER — DEXTROSE 50 % IV SOLN
1.0000 | Freq: Once | INTRAVENOUS | Status: AC
  Administered 2021-06-10: 50 mL via INTRAVENOUS

## 2021-06-10 MED ORDER — DEXTROSE 50 % IV SOLN
INTRAVENOUS | Status: AC
  Filled 2021-06-10: qty 50

## 2021-06-10 MED ORDER — POTASSIUM CHLORIDE CRYS ER 20 MEQ PO TBCR
40.0000 meq | EXTENDED_RELEASE_TABLET | Freq: Once | ORAL | Status: AC
  Administered 2021-06-10: 40 meq via ORAL
  Filled 2021-06-10: qty 2

## 2021-06-10 MED ORDER — INSULIN ASPART 100 UNIT/ML IJ SOLN
5.0000 [IU] | Freq: Three times a day (TID) | INTRAMUSCULAR | Status: DC
Start: 2021-06-10 — End: 2021-06-10
  Administered 2021-06-10 (×2): 5 [IU] via SUBCUTANEOUS

## 2021-06-10 MED ORDER — AMLODIPINE BESYLATE 5 MG PO TABS
5.0000 mg | ORAL_TABLET | Freq: Every day | ORAL | Status: DC
  Administered 2021-06-11 – 2021-06-13 (×3): 5 mg via ORAL
  Filled 2021-06-10 (×3): qty 1

## 2021-06-10 MED ORDER — LOPERAMIDE HCL 2 MG PO CAPS
2.0000 mg | ORAL_CAPSULE | Freq: Once | ORAL | Status: AC
  Administered 2021-06-10: 2 mg via ORAL
  Filled 2021-06-10: qty 1

## 2021-06-10 MED ORDER — SPIRONOLACTONE 12.5 MG HALF TABLET
12.5000 mg | ORAL_TABLET | Freq: Every day | ORAL | Status: DC
  Administered 2021-06-10 – 2021-06-13 (×4): 12.5 mg via ORAL
  Filled 2021-06-10 (×4): qty 1

## 2021-06-10 MED ORDER — INSULIN ASPART 100 UNIT/ML IJ SOLN
5.0000 [IU] | Freq: Three times a day (TID) | INTRAMUSCULAR | Status: DC
  Administered 2021-06-12 – 2021-06-13 (×3): 5 [IU] via SUBCUTANEOUS

## 2021-06-10 MED ORDER — CARVEDILOL 6.25 MG PO TABS
6.2500 mg | ORAL_TABLET | Freq: Two times a day (BID) | ORAL | Status: DC
Start: 2021-06-11 — End: 2021-06-13
  Administered 2021-06-11 – 2021-06-13 (×5): 6.25 mg via ORAL
  Filled 2021-06-10 (×5): qty 1

## 2021-06-10 MED ORDER — INSULIN ASPART 100 UNIT/ML IJ SOLN
0.0000 [IU] | Freq: Three times a day (TID) | INTRAMUSCULAR | Status: DC
  Administered 2021-06-10: 1 [IU] via SUBCUTANEOUS
  Administered 2021-06-11 (×2): 3 [IU] via SUBCUTANEOUS
  Administered 2021-06-12: 1 [IU] via SUBCUTANEOUS
  Administered 2021-06-12: 7 [IU] via SUBCUTANEOUS
  Administered 2021-06-12: 1 [IU] via SUBCUTANEOUS
  Administered 2021-06-13: 2 [IU] via SUBCUTANEOUS
  Administered 2021-06-13: 3 [IU] via SUBCUTANEOUS

## 2021-06-10 NOTE — Progress Notes (Signed)
?PROGRESS NOTE ? ? ? ?Hannah Dougherty  ALP:[id number] DOB: 1970/07/25 DOA: 06/08/2021 ?PCP: [name], NP  ? ?  ?Brief Narrative:  ?Hannah Dougherty is a 51 y.o. female with medical history significant of class II obesity, type II DM, hypertension, hyperlipidemia, right kidney mass, active tobacco use who came into the emergency department due to multiple episodes of nausea, emesis and diarrhea since yesterday associated with abdominal cramping. She had not been taking her regular medications due to lack of insurance coverage.  She was found to have elevated blood pressure 215/204 and she was admitted for hypertensive urgency. ? ?New events last 24 hours / Subjective: ?Patient sleeping this morning.  Easily arousable. Continues to have diminished oral intake, feeling sick to her stomach this morning. ? ?Assessment & Plan: ?  ?Principal Problem: ?  Hypertensive urgency ?Active Problems: ?  Hyperlipidemia associated with type 2 diabetes mellitus ([place]) ?  Right kidney mass ?  Type 2 diabetes mellitus with hyperglycemia ([facility]) ?  Class 2 obesity ?  Acute gastroenteritis ? ? ?Hypertensive urgency ?-Improved but still remains elevated 177/90  ?-Continue amlodipine, Coreg, lisinopril, spironolactone. Hold HCTZ due to hyponatremia ? ?Hyponatremia ?-Hold HCTZ ?-May need to start IVF  ? ?Acute gastroenteritis ?-Initially improved and advanced diet, but today not feeling well. May need to start IVF this afternoon if continued nausea, poor PO  ? ?Hypokalemia ?-Replace  ? ?Uncontrolled type 2 diabetes with hyperglycemia ?-A1c 13.2 ?-Semglee, SSI  ? ?CKD stage IIIa ?-Stable ? ?Right kidney mass ?-CT A/P: 2.9 x 2.1 x 2.4 cm enhancing lesion in the lower pole of the right kidney is compatible with a slow growing neoplasm, slightly enlarged compared to the prior study, likely a renal cell carcinoma. This remains encapsulated within Gerota's fascia and is separate from the right renal vein which is widely patent. No lymphadenopathy  or definite signs of metastatic disease on today's examination. Nonemergent outpatient referral to Urology for further clinical management is recommended. ? ?Hyperlipidemia ?-Pravachol ? ?GERD ?-PPI ? ?Tobacco abuse ?-Nicotine patch  ? ?Obesity ?Estimated body mass index is 35.08 kg/m? as calculated from the following: ?  Height as of this encounter: '5\' 5"'$  (1.651 m). ?  Weight as of this encounter: 95.6 kg. ? ? ? ?DVT prophylaxis:  ?SCDs Start: 06/08/21 1031 ? ?Code Status: Full ?Family Communication: None at bedside ?Disposition Plan:  ?Status is: Observation ?The patient will require care spanning > 2 midnights and should be moved to inpatient because: remains with elevated BP and BS. Need further medication  management. Now hyponatremic and decreased PO intake  ? ?Antimicrobials:  ?Anti-infectives (From admission, onward)  ? ? None  ? ?  ? ? ? ?Objective: ?Vitals:  ? 06/09/21 1842 06/09/21 2029 06/10/21 3532 06/10/21 0759  ?BP: 136/85 (!) 158/85 (!) 177/90 (!) 155/93  ?Pulse: 76 69 63 69  ?Resp:  18 16   ?Temp:  98.1 ?F (36.7 ?C) 98.7 ?F (37.1 ?C)   ?TempSrc:  Oral Oral   ?SpO2:  93% 98%   ?Weight:      ?Height:      ? ? ?Intake/Output Summary (Last 24 hours) at 06/10/2021 1017 ?Last data filed at 06/09/2021 1423 ?Gross per 24 hour  ?Intake 360 ml  ?Output --  ?Net 360 ml  ? ? ?Filed Weights  ? 06/08/21 0435 06/08/21 1356  ?Weight: 102 kg 95.6 kg  ? ? ?Examination:  ?General exam: Appears calm  ?Respiratory system: Clear to auscultation. Respiratory effort normal. No respiratory distress.  No conversational dyspnea.  ?Cardiovascular system: S1 & S2 heard, RRR. No murmurs. No pedal edema. ?Gastrointestinal system: Abdomen is nondistended, soft and nontender ?Central nervous system: Alert and oriented. No focal neurological deficits. Speech clear.  ?Extremities: Symmetric in appearance  ?Skin: No rashes, lesions or ulcers on exposed skin  ?Psychiatry: Judgement and insight appear normal. Mood & affect appropriate.   ? ?Data Reviewed: I have personally reviewed following labs and imaging studies ? ?CBC: ?Recent Labs  ?Lab 06/08/21 ?0446  ?WBC 11.4*  ?NEUTROABS 9.3*  ?HGB 13.9  ?HCT 42.9  ?MCV 81.9  ?PLT 252  ? ? ?Basic Metabolic Panel: ?Recent Labs  ?Lab 06/08/21 ?0446 06/08/21 ?0640 06/10/21 ?0332  ?NA 131*  --  125*  ?K 3.7  --  3.4*  ?CL 92*  --  91*  ?CO2 25  --  26  ?GLUCOSE 412*  --  194*  ?BUN 17  --  22*  ?CREATININE 1.14*  --  1.06*  ?CALCIUM 9.2  --  8.8*  ?MG  --  1.7  --   ? ? ?GFR: ?Estimated Creatinine Clearance: 70.2 mL/min (A) (by C-G formula based on SCr of 1.06 mg/dL (H)). ?Liver Function Tests: ?Recent Labs  ?Lab 06/08/21 ?0446  ?AST 18  ?ALT 18  ?ALKPHOS 96  ?BILITOT 0.6  ?PROT 8.3*  ?ALBUMIN 4.1  ? ? ?Recent Labs  ?Lab 06/08/21 ?0446  ?LIPASE 76*  ? ? ?No results for input(s): AMMONIA in the last 168 hours. ?Coagulation Profile: ?No results for input(s): INR, PROTIME in the last 168 hours. ?Cardiac Enzymes: ?No results for input(s): CKTOTAL, CKMB, CKMBINDEX, TROPONINI in the last 168 hours. ?BNP (last 3 results) ?No results for input(s): PROBNP in the last 8760 hours. ?HbA1C: ?Recent Labs  ?  06/08/21 ?0446  ?HGBA1C 13.2*  ? ? ?CBG: ?Recent Labs  ?Lab 06/09/21 ?0737 06/09/21 ?1115 06/09/21 ?1644 06/09/21 ?2024 06/10/21 ?0734  ?GLUCAP 217* 242* 186* 191* 211*  ? ? ?Lipid Profile: ?No results for input(s): CHOL, HDL, LDLCALC, TRIG, CHOLHDL, LDLDIRECT in the last 72 hours. ?Thyroid Function Tests: ?No results for input(s): TSH, T4TOTAL, FREET4, T3FREE, THYROIDAB in the last 72 hours. ?Anemia Panel: ?No results for input(s): VITAMINB12, FOLATE, FERRITIN, TIBC, IRON, RETICCTPCT in the last 72 hours. ?Sepsis Labs: ?No results for input(s): PROCALCITON, LATICACIDVEN in the last 168 hours. ? ?No results found for this or any previous visit (from the past 240 hour(s)).  ? ? ?Radiology Studies: ?No results found. ? ? ? ?Scheduled Meds: ? amLODipine  10 mg Oral Daily  ? carvedilol  12.5 mg Oral BID WC  ? insulin  aspart  0-20 Units Subcutaneous TID WC  ? insulin aspart  5 Units Subcutaneous TID WC  ? insulin glargine-yfgn  20 Units Subcutaneous QHS  ? lisinopril  40 mg Oral Daily  ? mouth rinse  15 mL Mouth Rinse BID  ? melatonin  3 mg Oral QHS  ? nicotine  14 mg Transdermal Daily  ? pantoprazole  40 mg Oral Daily  ? polyethylene glycol  17 g Oral Daily  ? pravastatin  40 mg Oral Daily  ? spironolactone  12.5 mg Oral Daily  ? ?Continuous Infusions: ? ? LOS: 0 days  ? ? ? ?Dessa Phi, DO ?[facility] ?06/10/2021, 10:17 AM  ? ?Available via Epic secure chat 7am-7pm ?After these hours, please refer to coverage provider listed on [email or web address] ? ?

## 2021-06-10 NOTE — Progress Notes (Signed)
Patient had emesis occurrence after eating some fresh fruit and cereal with milk.  Advised to try plain foods (e.g. bread, rice, potatoes) and avoid dairy when she orders lunch.  Denies nausea, pain at this time.  MD aware. ? ?Angie Fava, RN  ?

## 2021-06-10 NOTE — Progress Notes (Addendum)
?  PROGRESS NOTE ? ?I was called to bedside by nurse around 2:15 PM.  Patient was up to the sink getting washed up when she became diaphoretic and clammy and dizzy.  She returned back to bed.  Her blood pressure was found to be low 77/59 and manual confirmed 72/44.  Patient was examined by myself, bedside RN, charge nurse.  She denies any chest pain or shortness of breath, has had about 5 episodes of diarrhea today.  Has not really eaten much.  Due to her GI upset, she did not receive her morning medicines until noon.  She received Norvasc 10 mg, lisinopril 40 mg, Aldactone 12.5 mg at 12:00 PM, Coreg 12.5 mg at 8:20 AM.  Blood glucose was checked at 2:15 PM which was 103.  On telemetry, she is in normal sinus rhythm. ? ?Give IV fluid 1 L bolus now, then continue maintenance at 75 mL/hr  ?Repeat blood pressure after fluid bolus given ?We will decrease dose of Norvasc and lisinopril for tomorrow morning ? ?Dessa Phi, DO ?[facility] ?06/10/2021, 2:24 PM ? ? ?Addendum:  ? ?Re-checked on patient. After her IVF bolus, her BP remains at 86/52, brady with HR in the 40s. She had low BS of 76 which improved to 248 after D50. On exam, she admits to fatigue. No chest pain, SOB, abdominal pain. No change in her symptoms since my last exam an hour ago. Wanted to eat some lunch (baked potato). Family/friend at bedside updated.  ? ?Give another IV fluid 1 L bolus then repeat BP.  ?Decrease coreg dose and SSI dose going forward.  ?Stat CBC, BMP, Mg, troponin, TSH  ?Remain on tele.  ?Asked RN to alert rapid response RN to keep close eye. Low threshold to move to stepdown unit.  ? ?Dessa Phi, DO ?[facility] ?06/10/2021, 3:42 PM  ? ? ?Addendum: ? ?Around 4:30pm, I checked in with RN who reported improvement in BP to 107/72 and patient overall feeling better.  ? ?Around 6:30pm, I was notified by RN and rapid response RN that patient had complained of left hand cramping and left facial numbness. On rapid response RN  evaluation, patient without acute neuro deficits. I spoke with patient and she did not have noticeable slurring of speech. BP during event was 92/73. BS 137. I alerted my colleague in-hospital to check in on patient on my behalf.  ? ?Dessa Phi, DO ?[facility] ?06/10/2021, 7:56 PM  ? ?Available via Epic secure chat 7am-7pm ?After these hours, please refer to coverage provider listed on [email or web address] ? ? ?

## 2021-06-10 NOTE — Progress Notes (Addendum)
CBG 76 at 1501.  MD notified, D50 IV push ordered and administered. ? ?CBG 248 when rechecked at 1525. ? ?Angie Fava, RN  ?

## 2021-06-10 NOTE — Significant Event (Signed)
Rapid Response Event Note  ? ?Reason for Call :  ?New left sided weakness, facial numbness ? ?Initial Focused Assessment:  ?Called for left sided weakness and facial numbness. On arrival patient laying in bed awake and not appeared to be in distress. CBG 137. Patient alert and oriented x4. Per bedside RN, patient had slurred speech, left arm weakness and left facial numbness prior to rapid response arrival. During assessment speech clearer than before per bedside RN. No facial droop noted on exam. Patient states they are able to feel in all extremities now on assessment. Grip equal and strong bilaterally. Patient scored 0 on NIH.  ? ?Per bedside RN, patient had just gotten up to bedside to use bathroom and had bowel movement. Symptoms started after patient got back into bed. BP on arrival was 92/73, HR 50, RR 13, SpO2 100% RA.  ? ?Patient did have prior orthostatic event earlier this afternoon with movement. Discussed symptoms with Maylene Roes MD, patient spoke with Dr Maylene Roes and MD also agreed that speech sounds as it did earlier in assessment now. ? ? ?Interventions:  ?Q4 neuro checks placed by Maylene Roes MD. Continue fluids to help with BP ? ?Plan of Care:  ?Keep on floor at this time, monitor BP and neuro status per new orders. Call Rapid Response RN for any new changes [phone] ? ? ?Event Summary:  ? ?MD Notified: Maylene Roes MD- by bedside RN ?Call Time: [id number] ?Arrival Time: 3 ?End Time: 1950 ? ?Josph Macho, RN ?

## 2021-06-10 NOTE — Progress Notes (Addendum)
Patient called RN c/o feeling "cold and clammy."  RN noticed that patient was diaphoretic.  Patient reported giving herself a washup at the sink when she started feeling clammy and lightheaded.   ? ?MD and charge RN notified, at bedside.  CBG 103. ? ?1 L NS bolus begun per MD order.  Patient remains AAOx4.  Instructed to remain in bed for the time being. ? ? 06/10/21 1415  ?Vitals  ?BP (!) 72/44  ?BP Location Left Arm  ?BP Method Manual  ?Pulse Rate 65  ?Oxygen Therapy  ?SpO2 99 %  ?O2 Device Room Air  ? ?Angie Fava, RN  ?

## 2021-06-10 NOTE — Progress Notes (Addendum)
? ? ?  Patient: Hannah Dougherty MRN: [id number]   DOA: 06/08/2021 ? ?OVERNIGHT EVENT ?Evaluated patient at bedside around 6:45pm after I was notified by daytime attending of a rapid response. ? ?Patient is a 51 year old female who presented on 3/16 with nausea, vomiting diarrhea and found to have hypertensive urgency.   ? ?Patient has been experiencing hypotension down to 70 over 50s earlier in the afternoon after receiving Norvasc, lisinopril, Aldactone and Coreg. ?She has been receiving fluid bolus and continuous maintenance infusion throughout today with improvement in her blood pressure. ?However, around 630 patient reports that she was returning bed from bedside commode when she suddenly felt left hand spasm and also felt her left face spasm.  She denies any numbness or tingling.  She called her RN to bedside who did notice some left-sided facial droop around her mouth and slurred speech. ?Symptoms lasted for a few minutes and resolved.  BP at that time was around 92/73.  Blood glucose of 137. ? ?On my evaluation at bedside, her symptoms of completely resolved and had no neurological deficits found on exam.  She reports continued symptoms of diarrhea up to 7 episodes today although states this is improved from prior. ?In reviewing her lab work today, she has hyponatremia at 129 down from 131 on admission and worsening creatinine at 1.40 from a prior of 1.06.   ?Potassium and magnesium is within normal limits. ? ?Her symptoms of left hand and left facial spasm could potentially be secondary to hyponatremia but given hypotension throughout today.  Will obtain stat CT head to rule out stroke. Will follow results of CT head.  ? ?Addendum  ?CT head negative for acute findings. Will complete workup with MRI brain-result pending at my departure. Will have on-call APP follow up on results.  ?

## 2021-06-11 DIAGNOSIS — E669 Obesity, unspecified: Secondary | ICD-10-CM | POA: Diagnosis present

## 2021-06-11 DIAGNOSIS — F17210 Nicotine dependence, cigarettes, uncomplicated: Secondary | ICD-10-CM | POA: Diagnosis present

## 2021-06-11 DIAGNOSIS — C641 Malignant neoplasm of right kidney, except renal pelvis: Secondary | ICD-10-CM | POA: Diagnosis present

## 2021-06-11 DIAGNOSIS — Z7984 Long term (current) use of oral hypoglycemic drugs: Secondary | ICD-10-CM | POA: Diagnosis not present

## 2021-06-11 DIAGNOSIS — K529 Noninfective gastroenteritis and colitis, unspecified: Secondary | ICD-10-CM

## 2021-06-11 DIAGNOSIS — N2889 Other specified disorders of kidney and ureter: Secondary | ICD-10-CM | POA: Diagnosis present

## 2021-06-11 DIAGNOSIS — Z597 Insufficient social insurance and welfare support: Secondary | ICD-10-CM | POA: Diagnosis not present

## 2021-06-11 DIAGNOSIS — I10 Essential (primary) hypertension: Secondary | ICD-10-CM

## 2021-06-11 DIAGNOSIS — I959 Hypotension, unspecified: Secondary | ICD-10-CM | POA: Diagnosis not present

## 2021-06-11 DIAGNOSIS — E1122 Type 2 diabetes mellitus with diabetic chronic kidney disease: Secondary | ICD-10-CM | POA: Diagnosis present

## 2021-06-11 DIAGNOSIS — Z79899 Other long term (current) drug therapy: Secondary | ICD-10-CM | POA: Diagnosis not present

## 2021-06-11 DIAGNOSIS — E785 Hyperlipidemia, unspecified: Secondary | ICD-10-CM | POA: Diagnosis present

## 2021-06-11 DIAGNOSIS — E1169 Type 2 diabetes mellitus with other specified complication: Secondary | ICD-10-CM | POA: Diagnosis not present

## 2021-06-11 DIAGNOSIS — E1165 Type 2 diabetes mellitus with hyperglycemia: Secondary | ICD-10-CM | POA: Diagnosis present

## 2021-06-11 DIAGNOSIS — R112 Nausea with vomiting, unspecified: Secondary | ICD-10-CM | POA: Diagnosis not present

## 2021-06-11 DIAGNOSIS — R197 Diarrhea, unspecified: Secondary | ICD-10-CM

## 2021-06-11 DIAGNOSIS — R4781 Slurred speech: Secondary | ICD-10-CM | POA: Diagnosis not present

## 2021-06-11 DIAGNOSIS — E871 Hypo-osmolality and hyponatremia: Secondary | ICD-10-CM | POA: Diagnosis not present

## 2021-06-11 DIAGNOSIS — K219 Gastro-esophageal reflux disease without esophagitis: Secondary | ICD-10-CM | POA: Diagnosis present

## 2021-06-11 DIAGNOSIS — Z833 Family history of diabetes mellitus: Secondary | ICD-10-CM | POA: Diagnosis not present

## 2021-06-11 DIAGNOSIS — I129 Hypertensive chronic kidney disease with stage 1 through stage 4 chronic kidney disease, or unspecified chronic kidney disease: Secondary | ICD-10-CM | POA: Diagnosis present

## 2021-06-11 DIAGNOSIS — Z6835 Body mass index (BMI) 35.0-35.9, adult: Secondary | ICD-10-CM | POA: Diagnosis not present

## 2021-06-11 DIAGNOSIS — Z8249 Family history of ischemic heart disease and other diseases of the circulatory system: Secondary | ICD-10-CM | POA: Diagnosis not present

## 2021-06-11 DIAGNOSIS — R29810 Facial weakness: Secondary | ICD-10-CM | POA: Diagnosis not present

## 2021-06-11 DIAGNOSIS — N1831 Chronic kidney disease, stage 3a: Secondary | ICD-10-CM | POA: Diagnosis present

## 2021-06-11 DIAGNOSIS — E876 Hypokalemia: Secondary | ICD-10-CM | POA: Diagnosis not present

## 2021-06-11 DIAGNOSIS — I160 Hypertensive urgency: Secondary | ICD-10-CM | POA: Diagnosis present

## 2021-06-11 DIAGNOSIS — K090 Developmental odontogenic cysts: Secondary | ICD-10-CM | POA: Diagnosis present

## 2021-06-11 LAB — GLUCOSE, CAPILLARY
Glucose-Capillary: 112 mg/dL — ABNORMAL HIGH (ref 70–99)
Glucose-Capillary: 233 mg/dL — ABNORMAL HIGH (ref 70–99)
Glucose-Capillary: 204 mg/dL — ABNORMAL HIGH (ref 70–99)
Glucose-Capillary: 139 mg/dL — ABNORMAL HIGH (ref 70–99)

## 2021-06-11 LAB — BASIC METABOLIC PANEL
Anion gap: 8 (ref 5–15)
BUN: 22 mg/dL — ABNORMAL HIGH (ref 6–20)
CO2: 20 mmol/L — ABNORMAL LOW (ref 22–32)
Calcium: 8 mg/dL — ABNORMAL LOW (ref 8.9–10.3)
Chloride: 102 mmol/L (ref 98–111)
Creatinine, Ser: 1.18 mg/dL — ABNORMAL HIGH (ref 0.44–1.00)
GFR, Estimated: 55 mL/min — ABNORMAL LOW (ref >60)
Glucose, Bld: 97 mg/dL (ref 70–99)
Potassium: 3 mmol/L — ABNORMAL LOW (ref 3.5–5.1)
Sodium: 130 mmol/L — ABNORMAL LOW (ref 135–145)

## 2021-06-11 LAB — CBC
HCT: 38.8 % (ref 36.0–46.0)
Hemoglobin: 12.8 g/dL (ref 12.0–15.0)
MCH: 27.1 pg (ref 26.0–34.0)
MCHC: 33 g/dL (ref 30.0–36.0)
MCV: 82.2 fL (ref 80.0–100.0)
Platelets: 194 10*3/uL (ref 150–400)
RBC: 4.72 MIL/uL (ref 3.87–5.11)
RDW: 13.9 % (ref 11.5–15.5)
WBC: 4.7 10*3/uL (ref 4.0–10.5)
nRBC: 0 % (ref 0.0–0.2)

## 2021-06-11 MED ORDER — POTASSIUM CHLORIDE CRYS ER 20 MEQ PO TBCR
40.0000 meq | EXTENDED_RELEASE_TABLET | ORAL | Status: AC
  Administered 2021-06-11: 40 meq via ORAL
  Filled 2021-06-11 (×2): qty 2

## 2021-06-11 MED ORDER — INSULIN GLARGINE-YFGN 100 UNIT/ML ~~LOC~~ SOLN
10.0000 [IU] | Freq: Every day | SUBCUTANEOUS | Status: DC
  Administered 2021-06-11: 10 [IU] via SUBCUTANEOUS
  Filled 2021-06-11 (×2): qty 0.1

## 2021-06-11 MED ORDER — POTASSIUM CHLORIDE CRYS ER 20 MEQ PO TBCR: 40.0000 meq | EXTENDED_RELEASE_TABLET | ORAL | Status: DC

## 2021-06-11 NOTE — Progress Notes (Signed)
Pt with girlfriend, tearful and decreased appetite. Encouraged to take PO to prevent hypoglycemic episode. Pt attempting to eat small bites of potatoes. Med given as ordered, will cont to monitor.  ?

## 2021-06-11 NOTE — Progress Notes (Signed)
Held oral K+, pt nauseated will attempt again. SRP, RN ?

## 2021-06-11 NOTE — Plan of Care (Signed)
  Problem: Education: Goal: Knowledge of General Education information will improve Description Including pain rating scale, medication(s)/side effects and non-pharmacologic comfort measures Outcome: Progressing   Problem: Health Behavior/Discharge Planning: Goal: Ability to manage health-related needs will improve Outcome: Progressing   

## 2021-06-11 NOTE — Progress Notes (Signed)
Pt c/o of nausea after am med and breakfast, oral Zofran gicvne as IV started to leak, Pt dry heave, gingerale given to pt. Instructed pt to take bland foods, potatoes and soups. Will continue to monitor. SRP, RN ?

## 2021-06-11 NOTE — Progress Notes (Signed)
[facility] ? ?PROGRESS NOTE ? ?Hannah Dougherty GEX:[id number] DOB: [birth year] DOA: 06/08/2021 ?PCP: [name], NP ? ? ?Brief HPI:   ?51 year old female with medical history of class II obesity, diabetes mellitus type 2, hypertension, hyperlipidemia, right kidney mass, active tobacco use came to ER with complaints of nausea, emesis and diarrhea along with abdominal cramping.  Patient has not been taking her medications due to lack of insurance coverage.  In the ED she was found to have blood pressure elevation and was noted with hypertensive urgency. ?Yesterday patient had episode of left hand spasm with left facial droop, slurred speech, symptom lasted for few minutes.  This happened after patient was returning to bed from bedside commode.  Blood pressure at that time was 92/73.  Patient has been having episodes of hypotension earlier yesterday and received multiple fluid boluses.  CT head was negative for stroke.  MRI brain also was negative. ? ? ?Subjective  ? ?Patient seen and examined, denies any complaints.  Blood pressure is stable today.  All neurological symptoms have resolved.  MRI brain was negative for stroke. ? ? Assessment/Plan:  ? ? ? ?Left-sided facial droop/slurred speech ?-Patient had the symptoms yesterday, and are completely resolved ?-Resolved, CT head negative, MRI brain was negative for stroke ?-Likely associated with hypotension ? ?Hypertensive urgency ?-Resolved ? ?Hypertension ?-Blood pressure is better controlled after cutting down medications ?-Amlodipine was changed to 5 mg daily, Coreg was changed from 12.5 p.o. twice daily to 6.25 twice daily ?-Continue lisinopril, Aldactone ? ?Diabetes mellitus type 2 ?-Continue sliding scale insulin with NovoLog ?-CBG well controlled ? ?Hyponatremia ?-HCTZ on hold ?-Sodium 130 ? ?Hypokalemia ?-Potassium is 3.0 ?-We will replace potassium and follow BMP in am ? ?Acute gastroenteritis ?-Resolved ? ?Diabetes mellitus type 2 ?-Hemoglobin A1c  is 13.2 ?-Continue Semglee, sliding scale insulin NovoLog ? ?Right kidney mass ?-CT A/P: 2.9 x 2.1 x 2.4 cm enhancing lesion in the lower pole of the right kidney is compatible with a slow growing neoplasm, slightly enlarged compared to the prior study, likely a renal cell carcinoma. This remains encapsulated within Gerota's fascia and is separate from the right renal vein which is widely patent. No lymphadenopathy or definite signs of metastatic disease on today's examination. Nonemergent outpatient referral to Urology for further clinical management is recommended. ? ?Hyperlipidemia ?-Continue Pravachol ? ?Tobacco abuse ?-Nicotine patch ? ?Obesity ?Estimated body mass index is 35.08 kg/m? as calculated from the following: ?  Height as of this encounter: '5\' 5"'$  (1.651 m). ?  Weight as of this encounter: 95.6 kg. ?  ? ?Medications ? ?  ? amLODipine  5 mg Oral Daily  ? carvedilol  6.25 mg Oral BID WC  ? insulin aspart  0-9 Units Subcutaneous TID WC  ? insulin aspart  5 Units Subcutaneous TID WC  ? insulin glargine-yfgn  10 Units Subcutaneous QHS  ? lisinopril  20 mg Oral Daily  ? mouth rinse  15 mL Mouth Rinse BID  ? melatonin  3 mg Oral QHS  ? nicotine  14 mg Transdermal Daily  ? pantoprazole  40 mg Oral Daily  ? polyethylene glycol  17 g Oral Daily  ? potassium chloride  40 mEq Oral Q4H  ? pravastatin  40 mg Oral Daily  ? spironolactone  12.5 mg Oral Daily  ? ? ? Data Reviewed:  ? ?CBG: ? ?Recent Labs  ?Lab 06/10/21 ?1819 06/10/21 ?2035 06/11/21 ?0102 06/11/21 ?1152 06/11/21 ?1654  ?GLUCAP 137* 150* 112* 233* 204*  ? ? ?  SpO2: 99 %  ? ? ?Vitals:  ? 06/10/21 2038 06/10/21 2225 06/11/21 0227 06/11/21 0606  ?BP: 1'06/60 99/69 90/66 '$ 138/74  ?Pulse: (!) 51 65 (!) 56 60  ?Resp: '16 18 16 16  '$ ?Temp: 97.8 ?F (36.6 ?C) 97.7 ?F (36.5 ?C) 98 ?F (36.7 ?C) 98.2 ?F (36.8 ?C)  ?TempSrc: Oral Oral Oral Oral  ?SpO2: 100% 100% 99% 99%  ?Weight:      ?Height:      ? ? ? ? ?Data Reviewed: ? ?Basic Metabolic Panel: ?Recent Labs  ?Lab  06/08/21 ?0446 06/08/21 ?6283 06/10/21 ?1517 06/10/21 ?1653 06/11/21 ?0350  ?NA 131*  --  125* 129* 130*  ?K 3.7  --  3.4* 3.7 3.0*  ?CL 92*  --  91* 99 102  ?CO2 25  --  26 24 20*  ?GLUCOSE 412*  --  194* 149* 97  ?BUN 17  --  22* 23* 22*  ?CREATININE 1.14*  --  1.06* 1.40* 1.18*  ?CALCIUM 9.2  --  8.8* 8.1* 8.0*  ?MG  --  1.7  --  1.8  --   ? ? ?CBC: ?Recent Labs  ?Lab 06/08/21 ?0446 06/10/21 ?1653 06/11/21 ?0350  ?WBC 11.4* 6.5 4.7  ?NEUTROABS 9.3*  --   --   ?HGB 13.9 13.6 12.8  ?HCT 42.9 41.8 38.8  ?MCV 81.9 83.4 82.2  ?PLT 252 211 194  ? ? ?LFT ?Recent Labs  ?Lab 06/08/21 ?0446  ?AST 18  ?ALT 18  ?ALKPHOS 96  ?BILITOT 0.6  ?PROT 8.3*  ?ALBUMIN 4.1  ? ?  ?Antibiotics: ?Anti-infectives (From admission, onward)  ? ? None  ? ?  ? ? ? ?DVT prophylaxis: SCDs ? ?Code Status: Full code ? ?Family Communication: No family at bedside ? ? ?CONSULTS  ? ? ?Objective  ? ? ?Physical Examination: ? ? ?General-appears in no acute distress ?Heart-S1-S2, regular, no murmur auscultated ?Lungs-clear to auscultation bilaterally, no wheezing or crackles auscultated ?Abdomen-soft, nontender, no organomegaly ?Extremities-no edema in the lower extremities ?Neuro-alert, oriented x3, no focal deficit noted ? ?Status is: Inpatient: Hypertensive urgency ? ? ? ?  ? ? ? ? ? ?Oswald Hillock ?  ?[facility] ?If 7PM-7AM, please contact night-coverage at [email or web address], ?Office  [phone] ? ? ?06/11/2021, 5:43 PM  LOS: 0 days  ? ? ? ? ? ? ? ? ? ? ?  ?

## 2021-06-12 ENCOUNTER — Inpatient Hospital Stay (HOSPITAL_COMMUNITY): Payer: 59

## 2021-06-12 DIAGNOSIS — E1165 Type 2 diabetes mellitus with hyperglycemia: Secondary | ICD-10-CM | POA: Diagnosis not present

## 2021-06-12 DIAGNOSIS — I160 Hypertensive urgency: Secondary | ICD-10-CM | POA: Diagnosis not present

## 2021-06-12 DIAGNOSIS — N2889 Other specified disorders of kidney and ureter: Secondary | ICD-10-CM | POA: Diagnosis not present

## 2021-06-12 LAB — BASIC METABOLIC PANEL
Anion gap: 7 (ref 5–15)
BUN: 11 mg/dL (ref 6–20)
CO2: 25 mmol/L (ref 22–32)
Calcium: 8.1 mg/dL — ABNORMAL LOW (ref 8.9–10.3)
Chloride: 99 mmol/L (ref 98–111)
Creatinine, Ser: 1.1 mg/dL — ABNORMAL HIGH (ref 0.44–1.00)
GFR, Estimated: >60 mL/min (ref >60)
Glucose, Bld: 129 mg/dL — ABNORMAL HIGH (ref 70–99)
Potassium: 3 mmol/L — ABNORMAL LOW (ref 3.5–5.1)
Sodium: 131 mmol/L — ABNORMAL LOW (ref 135–145)

## 2021-06-12 LAB — CBC
HCT: 38.5 % (ref 36.0–46.0)
Hemoglobin: 12.9 g/dL (ref 12.0–15.0)
MCH: 27.4 pg (ref 26.0–34.0)
MCHC: 33.5 g/dL (ref 30.0–36.0)
MCV: 81.9 fL (ref 80.0–100.0)
Platelets: 222 10*3/uL (ref 150–400)
RBC: 4.7 MIL/uL (ref 3.87–5.11)
RDW: 13.8 % (ref 11.5–15.5)
WBC: 5.1 10*3/uL (ref 4.0–10.5)
nRBC: 0 % (ref 0.0–0.2)

## 2021-06-12 LAB — GLUCOSE, CAPILLARY
Glucose-Capillary: 133 mg/dL — ABNORMAL HIGH (ref 70–99)
Glucose-Capillary: 317 mg/dL — ABNORMAL HIGH (ref 70–99)
Glucose-Capillary: 337 mg/dL — ABNORMAL HIGH (ref 70–99)
Glucose-Capillary: 127 mg/dL — ABNORMAL HIGH (ref 70–99)
Glucose-Capillary: 250 mg/dL — ABNORMAL HIGH (ref 70–99)

## 2021-06-12 IMAGING — MR MR MRA NECK WO/W CM
4 of 5 series · 31 of 48 positions shown · IV contrast (9 GADAVIST)
Comparison: Concurrently performed MRA head [DATE]. MRI brain
[DATE].

CLINICAL DATA: Provided history: Carotid artery stenosis screening,
risk factors. Transient ischemic attack.

EXAM:
MRA NECK WITHOUT AND WITH CONTRAST
TECHNIQUE: Multiplanar and multiecho pulse sequences of the neck were obtained
without and with intravenous contrast. Angiographic images of the
neck were obtained using MRA technique without and with intravenous
contrast.
CONTRAST:  10mL GADAVIST GADOBUTROL 1 MMOL/ML IV SOLN

[Series 3: tof_2d_tra include arch · axial · 3.5mm · 0.43mm/px · z∈[-218,-0]mm · 8 of 90 slices shown]
[im 1/90]
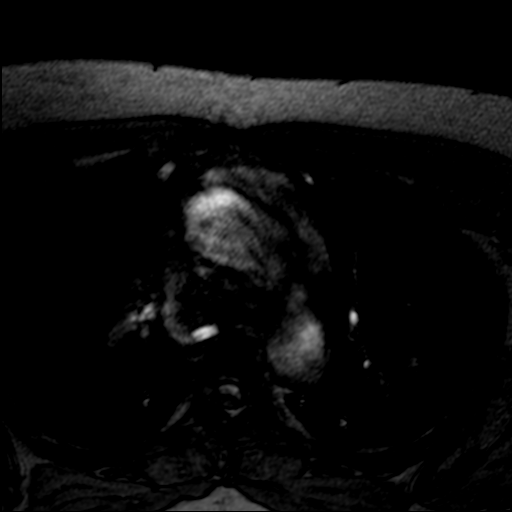
[im 13/90]
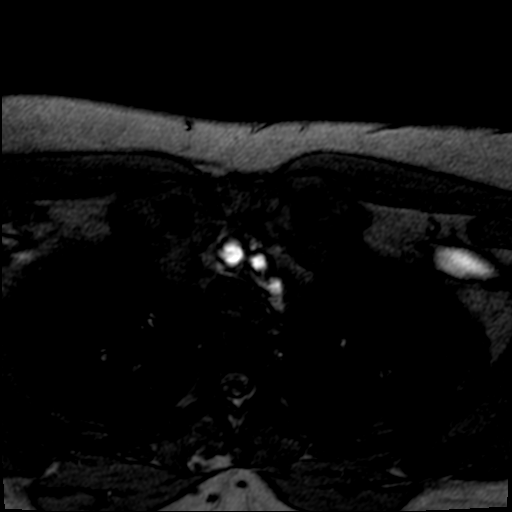
[im 26/90]
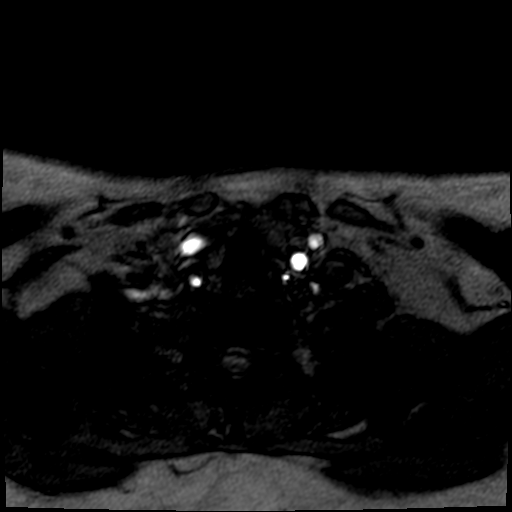
[im 39/90]
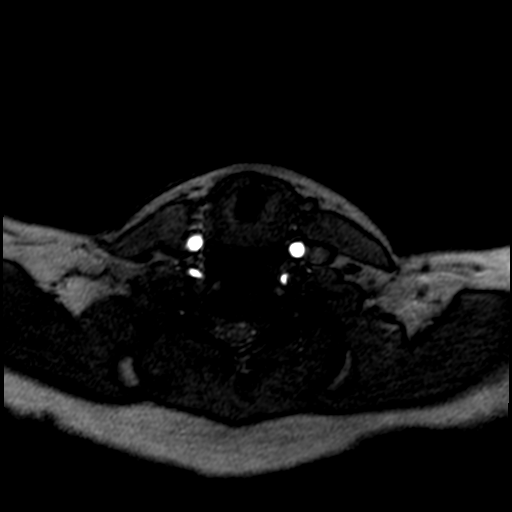
[im 51/90]
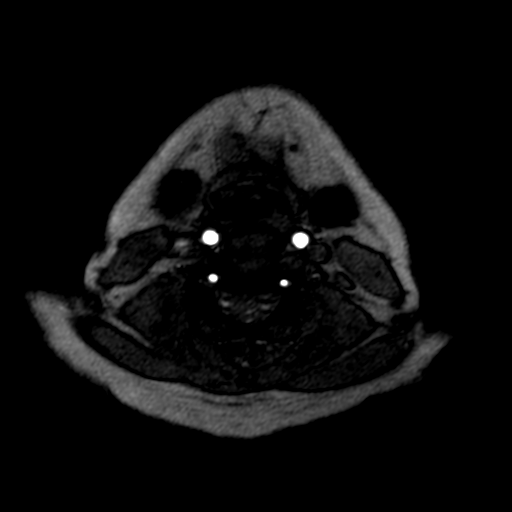
[im 64/90]
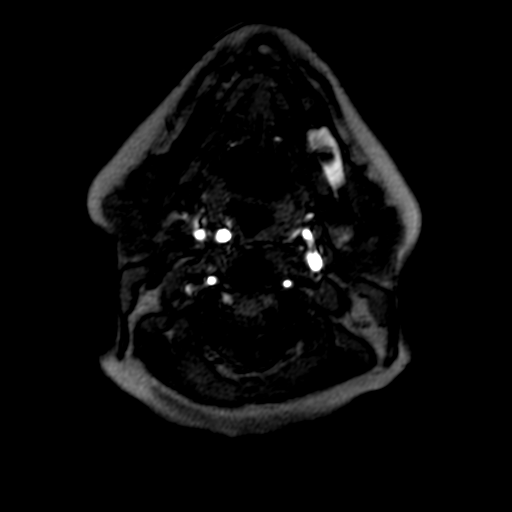
[im 77/90]
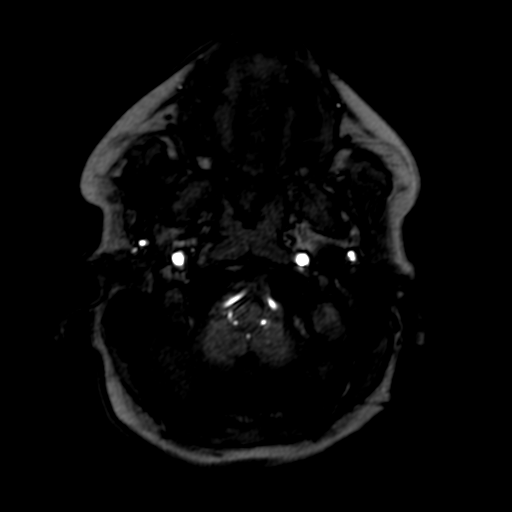
[im 90/90]
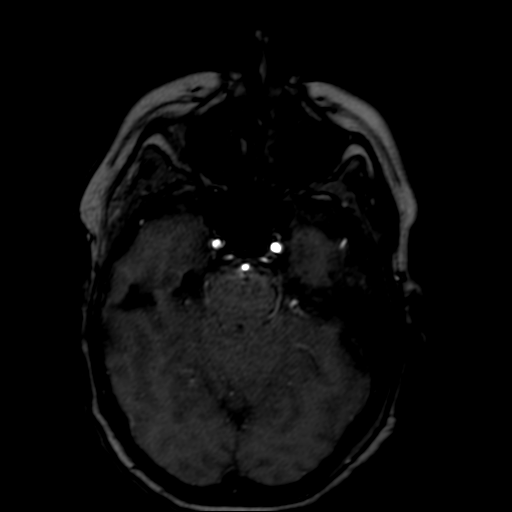

[Series 7: (id)_cor_pre · coronal · 0.8mm · 0.78mm/px · 8 of 96 slices shown]
[im 1/96]
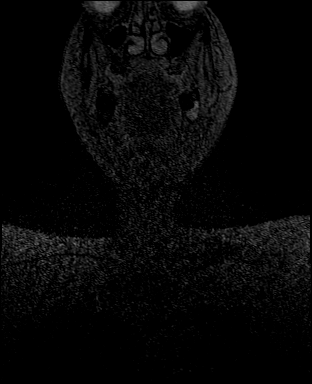
[im 11/96]
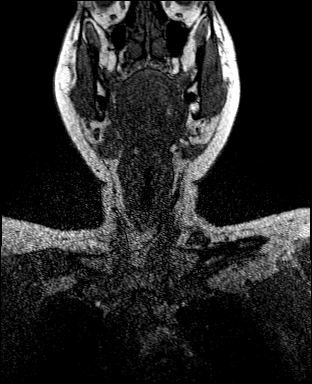
[im 32/96]
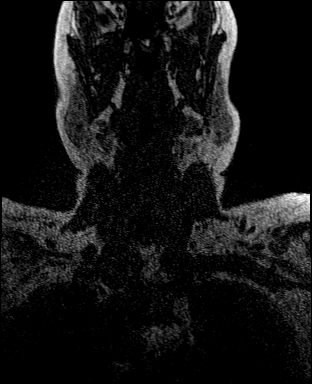
[im 43/96]
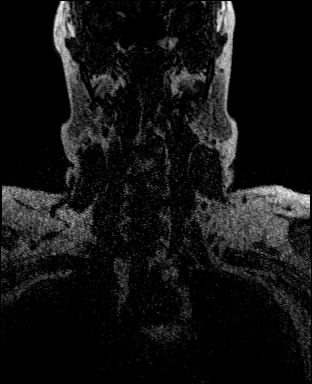
[im 53/96]
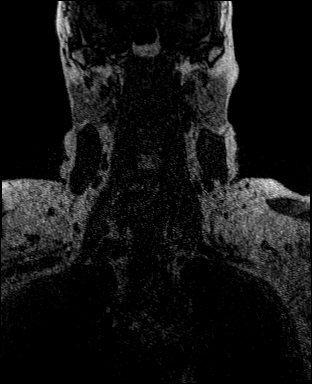
[im 64/96]
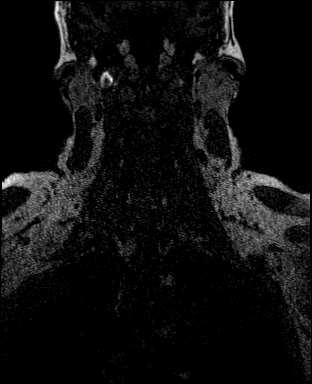
[im 85/96]
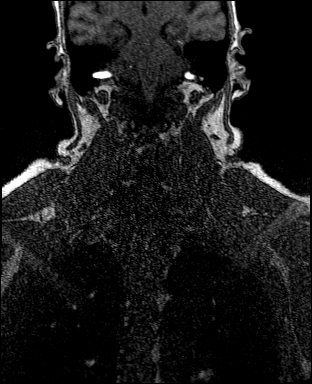
[im 96/96]
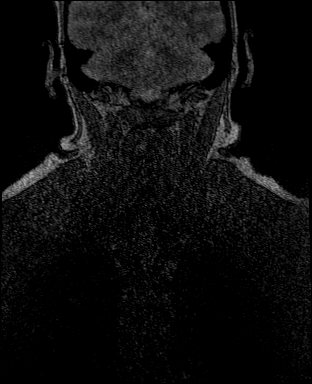

[Series 9: (id)_cor_post · coronal · 0.8mm · 0.78mm/px · 8 of 96 slices shown]
[im 1/96]
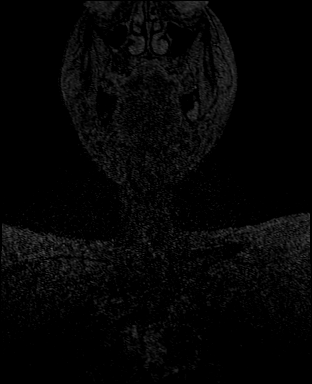
[im 11/96]
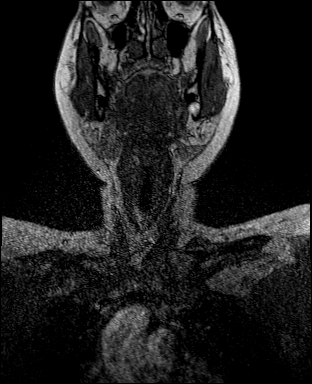
[im 32/96]
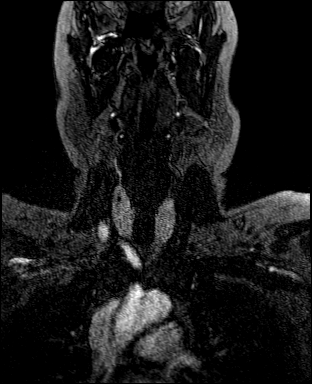
[im 43/96]
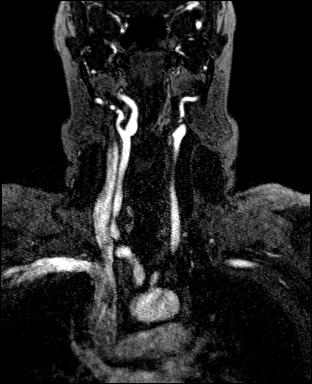
[im 53/96]
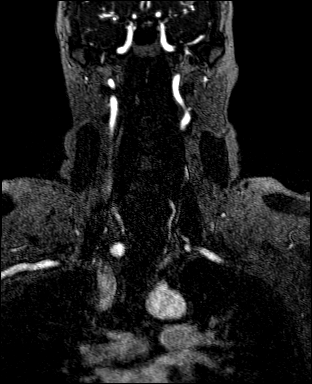
[im 64/96]
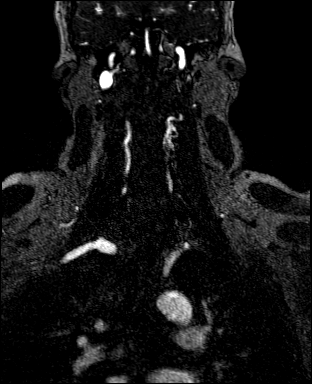
[im 85/96]
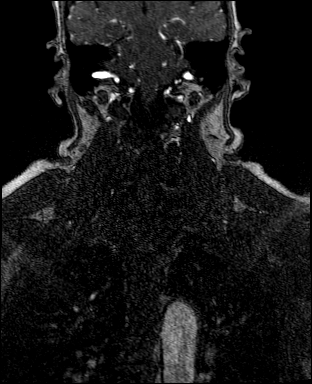
[im 96/96]
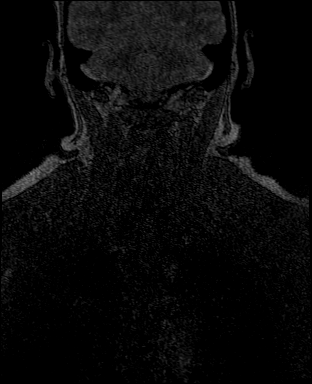

[Series 12: (id)_cor_post_venous · coronal · 0.8mm · 0.78mm/px · 7 of 96 slices shown]
[im 1/96]
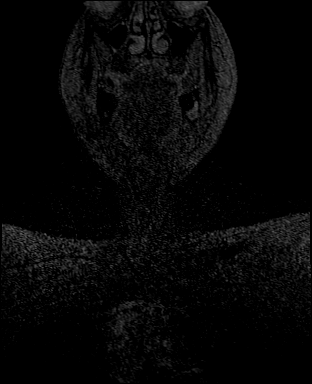
[im 11/96]
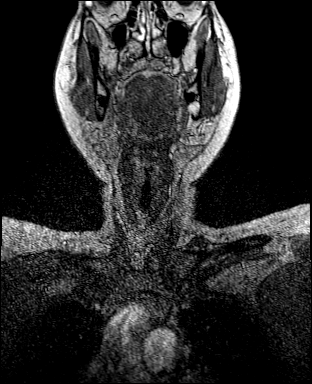
[im 32/96]
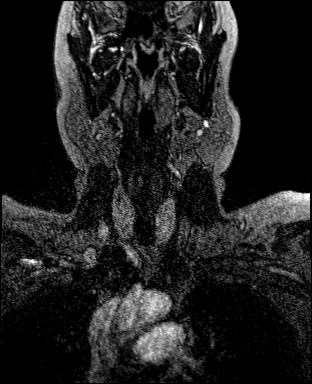
[im 43/96]
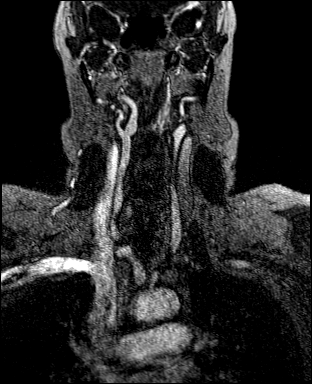
[im 53/96]
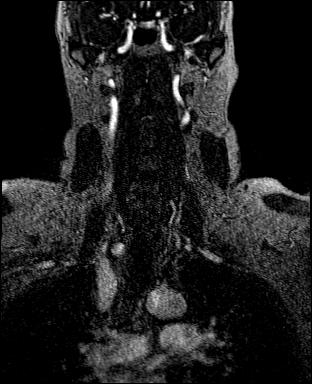
[im 64/96]
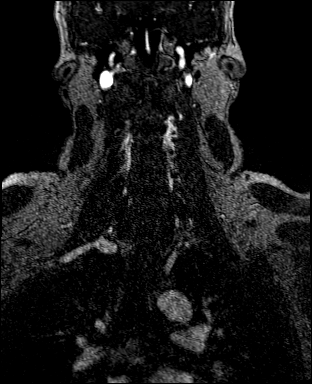
[im 85/96]
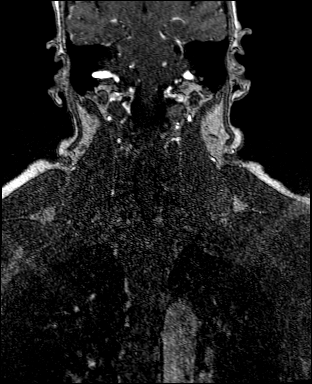

[31 of 48 positions shown; findings below may reference images not displayed]

FINDINGS: Standard aortic branching. The visualized aortic arch is normal in
caliber. No hemodynamically significant innominate or proximal
subclavian artery stenosis.

The common carotid and internal carotid arteries are patent within
the neck without appreciable stenosis. Partially retropharyngeal
course of the right cervical ICA.

The vertebral arteries are patent within the neck without
appreciable stenosis. The right vertebral artery is dominant.

2.6 x 1.0 cm T1 hyperintense, expansile osseous lesion within the
left mandible (for instance as seen on series 3, image 65) (series
9, image 6).
IMPRESSION: The common carotid, internal carotid and vertebral arteries are
patent within the neck without appreciable stenosis.

Incompletely assessed 2.6 x 1.0 cm expansile osseous lesion within
the left mandible. A maxillofacial CT is recommended for further
evaluation.

## 2021-06-12 IMAGING — MR MR MRA HEAD W/O CM
1 series · 22 of 48 positions shown · non-contrast
Comparison: Brain MRI [DATE]. Concurrently performed MRA neck
[DATE].

CLINICAL DATA: Provided history: Transient ischemic attack (TIA).

EXAM:
MRA HEAD WITHOUT CONTRAST
TECHNIQUE: Angiographic images of the Circle of Willis were acquired using MRA
technique without intravenous contrast.

[Series 8: TOF · axial · 0.6mm · 0.35mm/px · z∈[-49,+48]mm · 22 of 172 slices shown]
[im 1/172]
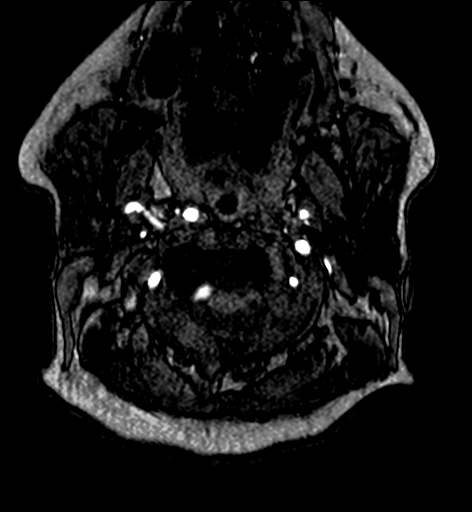
[im 4/172]
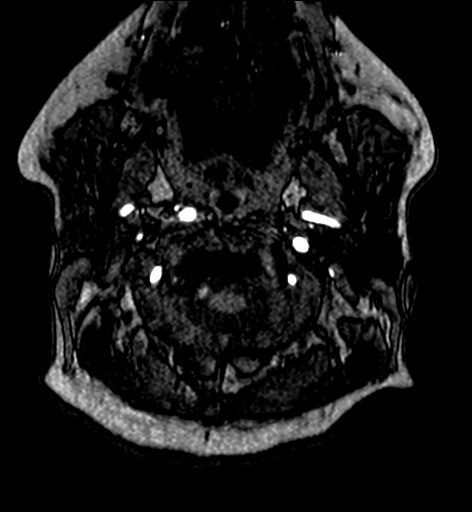
[im 8/172]
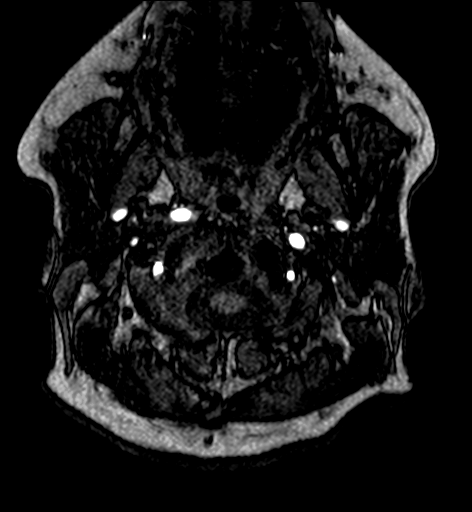
[im 11/172]
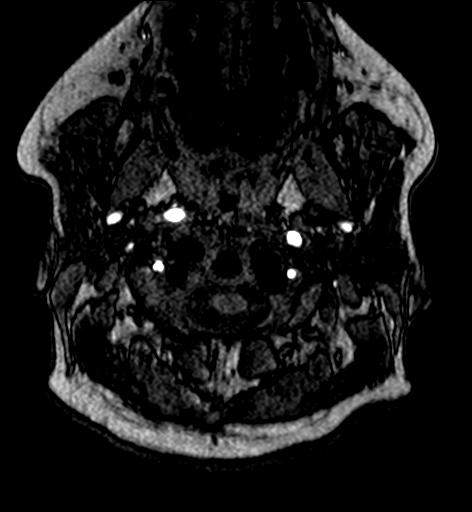
[im 15/172]
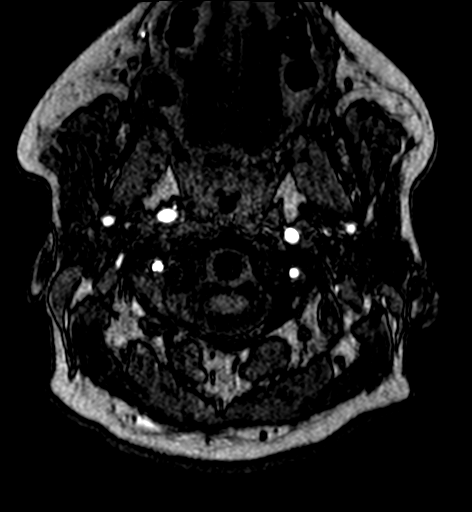
[im 19/172]
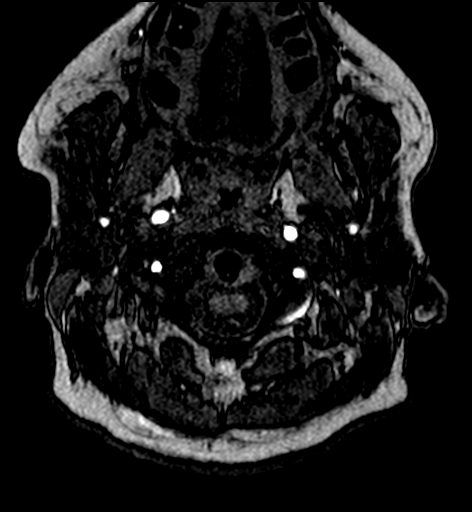
[im 22/172]
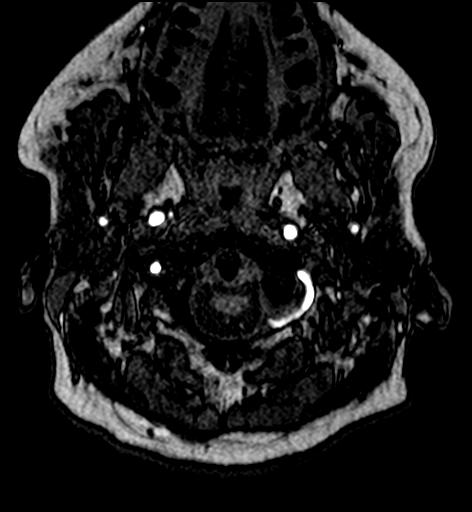
[im 26/172]
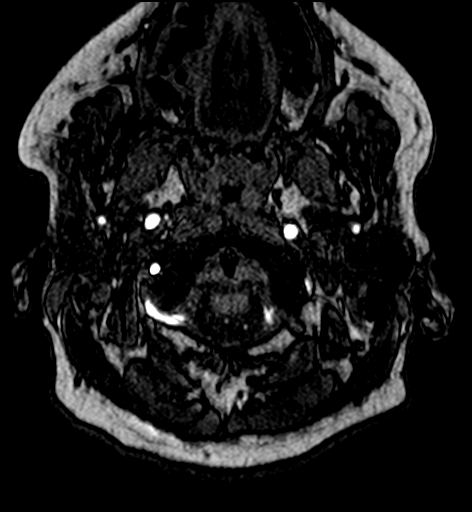
[im 30/172]
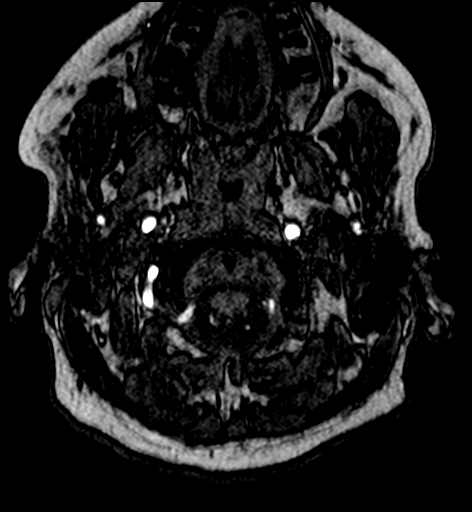
[im 33/172]
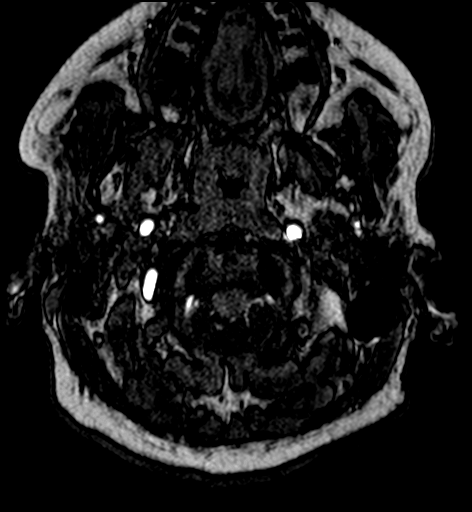
[im 37/172]
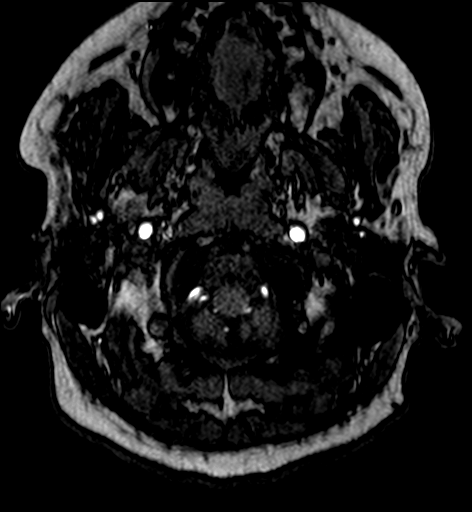
[im 41/172]
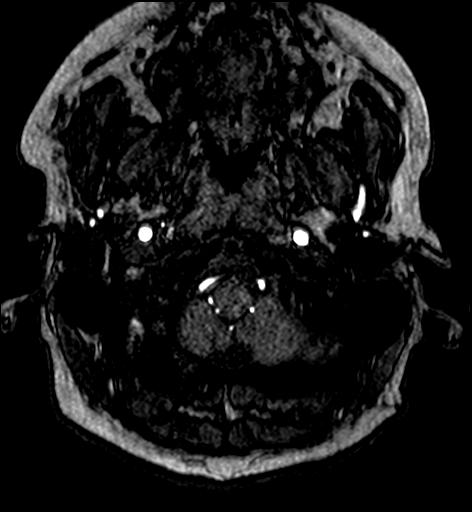
[im 44/172]
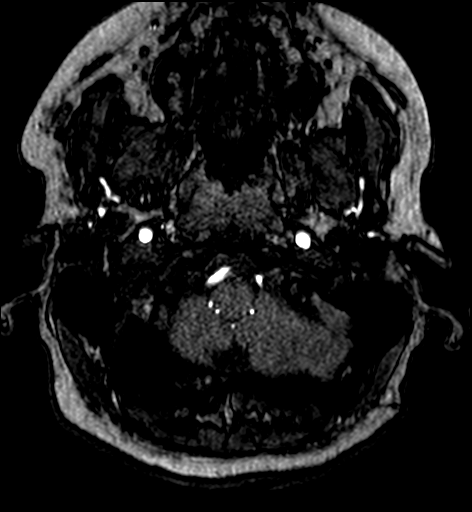
[im 48/172]
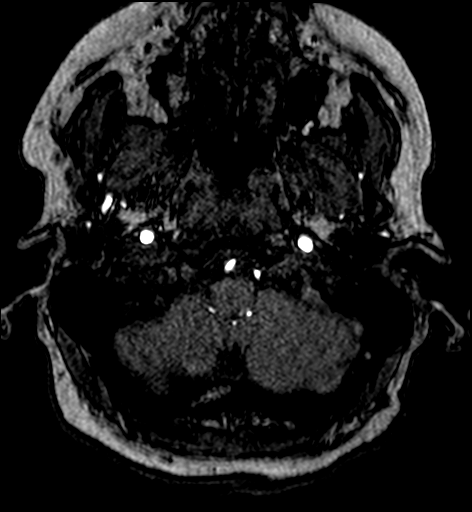
[im 55/172]
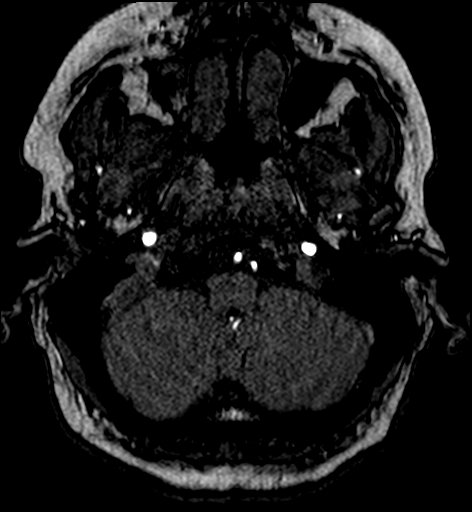
[im 77/172]
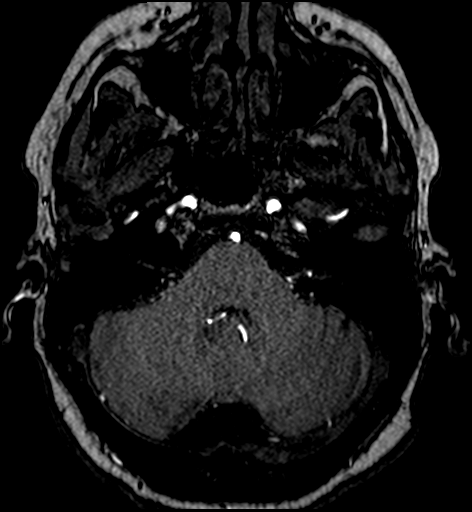
[im 88/172]
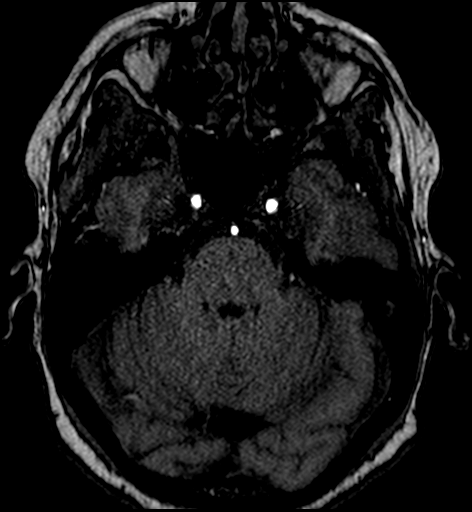
[im 99/172]
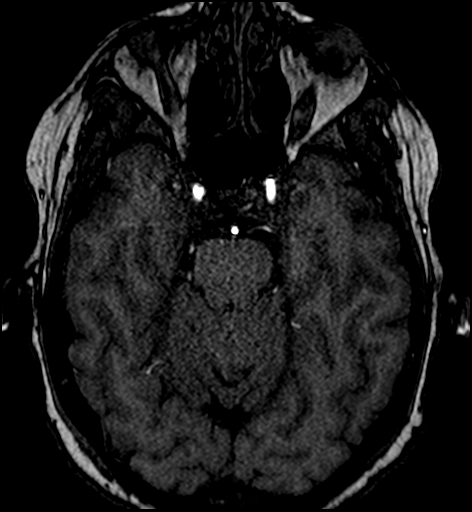
[im 121/172]
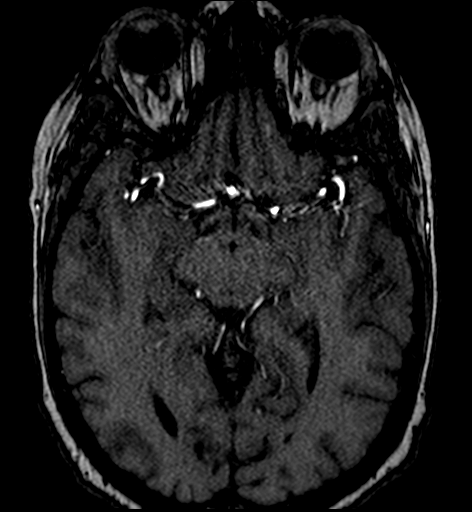
[im 142/172]
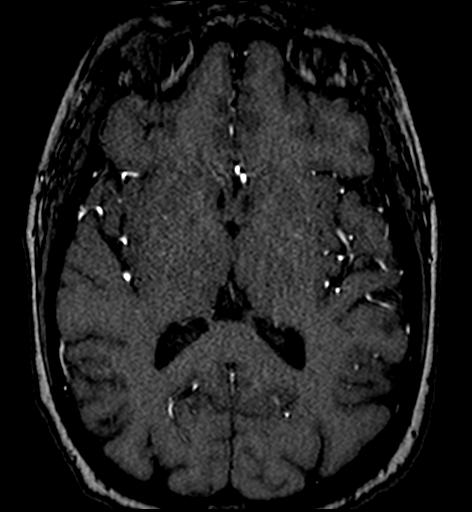
[im 146/172]
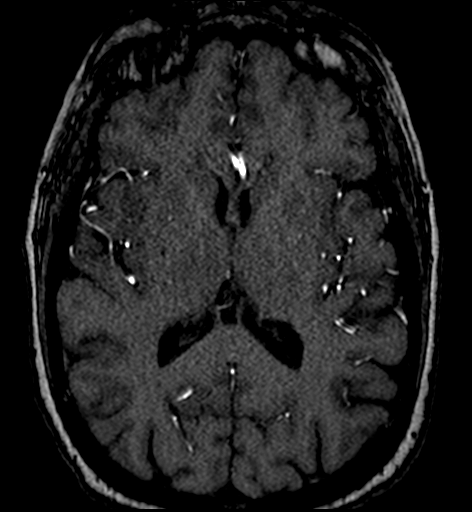
[im 164/172]
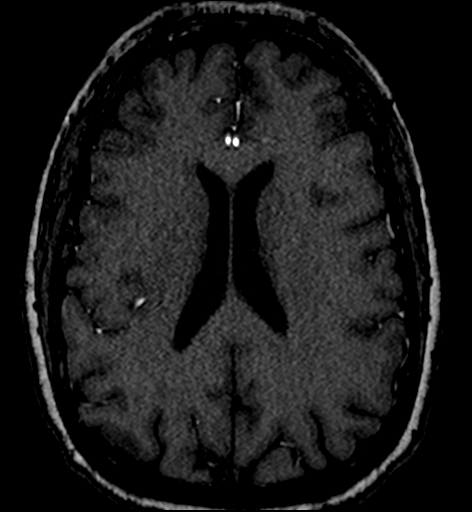

[22 of 48 positions shown; findings below may reference images not displayed]

FINDINGS: Anterior circulation:

The intracranial internal carotid arteries are patent. Suspected
fenestration within the supraclinoid left ICA (for instance as seen
on series 8, image 64). The M1 middle cerebral arteries are patent.
No M2 proximal branch occlusion or high-grade proximal stenosis is
identified. The anterior cerebral arteries are patent. No
intracranial aneurysm is identified.

Posterior circulation:

The intracranial vertebral arteries are patent. The right vertebral
artery is dominant. The basilar artery is patent. The posterior
cerebral arteries are patent. Mild stenosis within the left
posterior cerebral artery at the P1/P2 junction. Posterior
communicating arteries are present, bilaterally.

Anatomic variants: As described.
IMPRESSION: No intracranial large vessel occlusion or proximal high-grade
arterial stenosis.

Mild stenosis within the left posterior cerebral artery at the P1/P2
junction.

## 2021-06-12 MED ORDER — MAGNESIUM SULFATE 2 GM/50ML IV SOLN
2.0000 g | Freq: Once | INTRAVENOUS | Status: AC
  Administered 2021-06-12: 2 g via INTRAVENOUS
  Filled 2021-06-12: qty 50

## 2021-06-12 MED ORDER — INSULIN GLARGINE-YFGN 100 UNIT/ML ~~LOC~~ SOLN
20.0000 [IU] | Freq: Every day | SUBCUTANEOUS | Status: DC
  Administered 2021-06-12: 20 [IU] via SUBCUTANEOUS
  Filled 2021-06-12 (×2): qty 0.2

## 2021-06-12 MED ORDER — GADOBUTROL 1 MMOL/ML IV SOLN
10.0000 mL | Freq: Once | INTRAVENOUS | Status: AC | PRN
  Administered 2021-06-12: 10 mL via INTRAVENOUS

## 2021-06-12 MED ORDER — POTASSIUM CHLORIDE CRYS ER 20 MEQ PO TBCR
40.0000 meq | EXTENDED_RELEASE_TABLET | ORAL | Status: AC
  Administered 2021-06-12 (×2): 40 meq via ORAL
  Filled 2021-06-12 (×2): qty 2

## 2021-06-12 NOTE — Progress Notes (Signed)
[facility] ? ?PROGRESS NOTE ? ?Hannah Dougherty FAO:[id number] DOB: 06/03/1970 DOA: 06/08/2021 ?PCP: Hannah Perna, NP ? ? ?Brief HPI:   ?51 year old female with medical history of class II obesity, diabetes mellitus type 2, hypertension, hyperlipidemia, right kidney mass, active tobacco use came to ER with complaints of nausea, emesis and diarrhea along with abdominal cramping.  Patient has not been taking her medications due to lack of insurance coverage.  In the ED she was found to have blood pressure elevation and was noted with hypertensive urgency. ?Yesterday patient had episode of left hand spasm with left facial droop, slurred speech, symptom lasted for few minutes.  This happened after patient was returning to bed from bedside commode.  Blood pressure at that time was 92/73.  Patient has been having episodes of hypotension earlier yesterday and received multiple fluid boluses.  CT head was negative for stroke.  MRI brain also was negative for stroke. ? ? ?Subjective  ? ?Patient seen and examined, blood pressure is better controlled today. ? ? Assessment/Plan:  ? ? ? ?Left-sided facial droop/slurred speech ?-Patient had the symptoms yesterday, and are completely resolved ?-Resolved, CT head negative, MRI brain was negative for stroke ?-Likely associated with hypotension ?-Called and discussed with neurologist on-call Dr. Su Monks, she recommends getting MRA head and neck and if negative no further stroke work-up  ? ?Hypertensive urgency ?-Resolved ? ?Hypertension ?-Blood pressure is better controlled after cutting down medications ?-Amlodipine was changed to 5 mg daily, Coreg was changed from 12.5 p.o. twice daily to 6.25 twice daily ?-Continue lisinopril, Aldactone ? ?Diabetes mellitus type 2 ?-Continue sliding scale insulin with NovoLog ?-CBG well controlled ? ?Hyponatremia ?-HCTZ on hold ?-Sodium 130 ? ?Hypokalemia ?-Potassium is 3.0 ?-We will replace potassium and follow BMP in am ?-We will  also give 2 g of magnesium sulfate since magnesium was borderline on 06/10/2021 ? ?Acute gastroenteritis ?-Resolved ? ?Diabetes mellitus type 2 ?-Hemoglobin A1c is 13.2 ?-Continue Semglee, sliding scale insulin NovoLog ?-CBG has been elevated, will change Lantus to 20 units subcu daily ? ?Right kidney mass ?-CT A/P: 2.9 x 2.1 x 2.4 cm enhancing lesion in the lower pole of the right kidney is compatible with a slow growing neoplasm, slightly enlarged compared to the prior study, likely a renal cell carcinoma. This remains encapsulated within Gerota's fascia and is separate from the right renal vein which is widely patent. No lymphadenopathy or definite signs of metastatic disease on today's examination. Nonemergent outpatient referral to Urology for further clinical management is recommended.  Called and discussed with urologist, Dr. Abner Greenspan, he recommends that patient can be discharged with him as outpatient ? ?Hyperlipidemia ?-Continue Pravachol ? ?Tobacco abuse ?-Nicotine patch ? ?Obesity ?Estimated body mass index is 35.08 kg/m? as calculated from the following: ?  Height as of this encounter: '5\' 5"'$  (1.651 m). ?  Weight as of this encounter: 95.6 kg. ?  ? ?Medications ? ?  ? amLODipine  5 mg Oral Daily  ? carvedilol  6.25 mg Oral BID WC  ? insulin aspart  0-9 Units Subcutaneous TID WC  ? insulin aspart  5 Units Subcutaneous TID WC  ? insulin glargine-yfgn  10 Units Subcutaneous QHS  ? lisinopril  20 mg Oral Daily  ? mouth rinse  15 mL Mouth Rinse BID  ? melatonin  3 mg Oral QHS  ? nicotine  14 mg Transdermal Daily  ? pantoprazole  40 mg Oral Daily  ? polyethylene glycol  17 g Oral Daily  ?  pravastatin  40 mg Oral Daily  ? spironolactone  12.5 mg Oral Daily  ? ? ? Data Reviewed:  ? ?CBG: ? ?Recent Labs  ?Lab 06/11/21 ?1654 06/11/21 ?2031 06/12/21 ?0751 06/12/21 ?1159 06/12/21 ?1201  ?GLUCAP 204* 139* 133* 317* 337*  ? ? ?SpO2: 99 %  ? ? ?Vitals:  ? 06/11/21 0606 06/11/21 2030 06/11/21 2230 06/12/21 0451  ?BP: 138/74  (!) 194/96 (!) 157/71 127/72  ?Pulse: 60 69  63  ?Resp: '16 18  20  '$ ?Temp: 98.2 ?F (36.8 ?C) 99.1 ?F (37.3 ?C)  98 ?F (36.7 ?C)  ?TempSrc: Oral Oral  Oral  ?SpO2: 99% 98%  99%  ?Weight:      ?Height:      ? ? ? ? ?Data Reviewed: ? ?Basic Metabolic Panel: ?Recent Labs  ?Lab 06/08/21 ?0446 06/08/21 ?0640 06/10/21 ?0332 06/10/21 ?1653 06/11/21 ?0350 06/12/21 ?8242  ?NA 131*  --  125* 129* 130* 131*  ?K 3.7  --  3.4* 3.7 3.0* 3.0*  ?CL 92*  --  91* 99 102 99  ?CO2 25  --  26 24 20* 25  ?GLUCOSE 412*  --  194* 149* 97 129*  ?BUN 17  --  22* 23* 22* 11  ?CREATININE 1.14*  --  1.06* 1.40* 1.18* 1.10*  ?CALCIUM 9.2  --  8.8* 8.1* 8.0* 8.1*  ?MG  --  1.7  --  1.8  --   --   ? ? ?CBC: ?Recent Labs  ?Lab 06/08/21 ?0446 06/10/21 ?1653 06/11/21 ?0350 06/12/21 ?3536  ?WBC 11.4* 6.5 4.7 5.1  ?NEUTROABS 9.3*  --   --   --   ?HGB 13.9 13.6 12.8 12.9  ?HCT 42.9 41.8 38.8 38.5  ?MCV 81.9 83.4 82.2 81.9  ?PLT 252 211 194 222  ? ? ?LFT ?Recent Labs  ?Lab 06/08/21 ?0446  ?AST 18  ?ALT 18  ?ALKPHOS 96  ?BILITOT 0.6  ?PROT 8.3*  ?ALBUMIN 4.1  ? ?  ?Antibiotics: ?Anti-infectives (From admission, onward)  ? ? None  ? ?  ? ? ? ?DVT prophylaxis: SCDs ? ?Code Status: Full code ? ?Family Communication: No family at bedside ? ? ?CONSULTS  ? ? ?Objective  ? ? ?Physical Examination: ? ?General-appears in no acute distress ?Heart-S1-S2, regular, no murmur auscultated ?Lungs-clear to auscultation bilaterally, no wheezing or crackles auscultated ?Abdomen-soft, nontender, no organomegaly ?Extremities-no edema in the lower extremities ?Neuro-alert, oriented x3, no focal deficit noted ? ?Status is: Inpatient: Hypertensive urgency ? ? ? ?  ? ? ?Oswald Hillock ?  ?[facility] ?If 7PM-7AM, please contact night-coverage at [email or web address], ?Office  [phone] ? ? ?06/12/2021, 3:04 PM  LOS: 1 day  ? ? ? ? ? ? ? ? ? ? ?  ?

## 2021-06-12 NOTE — Progress Notes (Signed)
PT Cancellation Note ? ?Patient Details ?Name: Hannah Dougherty ?MRN: [id number] ?DOB: Jul 12, 1970 ? ? ?Cancelled Treatment:    Reason Eval/Treat Not Completed: PT screened, no needs identified, will sign off ?Tresa Endo PT ?Acute Rehabilitation Services ?Pager [phone] ?Office [phone] ? ? ? ?Dakwon Wenberg, Shella Maxim ?06/12/2021, 2:43 PM ?

## 2021-06-12 NOTE — TOC Progression Note (Signed)
Transition of Care (TOC) - Progression Note  ? ? ?Patient Details  ?Name: Hannah Dougherty ?MRN: [id number] ?Date of Birth: 03-Sep-1970 ? ?Transition of Care (TOC) CM/SW Contact  ?Purcell Mouton, RN ?Phone Number: ?06/12/2021, 9:56 AM ? ?Clinical Narrative:    ? ?Spoke with pt concerning PCP. Pt has a PCP, states that she has no transportation to see PCP. Encouraged pt to call the number on her insurance card to ask for a list of PCP's in her area. Telephone number for [facility] given to pt to call for an appointment.  ? ?  ?  ? ?Expected Discharge Plan and Services ?  ?  ?  ?  ?  ?                ?  ?  ?  ?  ?  ?  ?  ?  ?  ?  ? ? ?Social Determinants of Health (SDOH) Interventions ?  ? ?Readmission Risk Interventions ?   ? View : No data to display.  ?  ?  ?  ? ? ?

## 2021-06-12 NOTE — Plan of Care (Signed)

## 2021-06-13 ENCOUNTER — Inpatient Hospital Stay (HOSPITAL_COMMUNITY): Payer: 59

## 2021-06-13 DIAGNOSIS — E785 Hyperlipidemia, unspecified: Secondary | ICD-10-CM

## 2021-06-13 DIAGNOSIS — E119 Type 2 diabetes mellitus without complications: Secondary | ICD-10-CM

## 2021-06-13 DIAGNOSIS — Z794 Long term (current) use of insulin: Secondary | ICD-10-CM

## 2021-06-13 DIAGNOSIS — E1169 Type 2 diabetes mellitus with other specified complication: Secondary | ICD-10-CM

## 2021-06-13 LAB — BASIC METABOLIC PANEL
Anion gap: 5 (ref 5–15)
BUN: 13 mg/dL (ref 6–20)
CO2: 23 mmol/L (ref 22–32)
Calcium: 8 mg/dL — ABNORMAL LOW (ref 8.9–10.3)
Chloride: 103 mmol/L (ref 98–111)
Creatinine, Ser: 1.07 mg/dL — ABNORMAL HIGH (ref 0.44–1.00)
GFR, Estimated: >60 mL/min (ref >60)
Glucose, Bld: 196 mg/dL — ABNORMAL HIGH (ref 70–99)
Potassium: 4.1 mmol/L (ref 3.5–5.1)
Sodium: 131 mmol/L — ABNORMAL LOW (ref 135–145)

## 2021-06-13 LAB — GLUCOSE, CAPILLARY
Glucose-Capillary: 160 mg/dL — ABNORMAL HIGH (ref 70–99)
Glucose-Capillary: 206 mg/dL — ABNORMAL HIGH (ref 70–99)

## 2021-06-13 MED ORDER — METFORMIN HCL 1000 MG PO TABS: 1000.0000 mg | ORAL_TABLET | Freq: Two times a day (BID) | ORAL | 3 refills | Status: DC

## 2021-06-13 MED ORDER — PRAVASTATIN SODIUM 40 MG PO TABS: 40.0000 mg | ORAL_TABLET | Freq: Every day | ORAL | 1 refills | Status: DC

## 2021-06-13 MED ORDER — CARVEDILOL 6.25 MG PO TABS: 6.2500 mg | ORAL_TABLET | Freq: Two times a day (BID) | ORAL | 3 refills | Status: DC

## 2021-06-13 MED ORDER — AMLODIPINE BESYLATE 5 MG PO TABS: 5.0000 mg | ORAL_TABLET | Freq: Every day | ORAL | 3 refills | Status: DC

## 2021-06-13 MED ORDER — LISINOPRIL 20 MG PO TABS: 20.0000 mg | ORAL_TABLET | Freq: Every day | ORAL | 3 refills | Status: DC

## 2021-06-13 MED ORDER — IOHEXOL 300 MG/ML  SOLN
75.0000 mL | Freq: Once | INTRAMUSCULAR | Status: AC | PRN
  Administered 2021-06-13: 75 mL via INTRAVENOUS

## 2021-06-13 MED ORDER — INSULIN GLARGINE 100 UNIT/ML SOLOSTAR PEN: 20.0000 [IU] | PEN_INJECTOR | Freq: Every day | SUBCUTANEOUS | 3 refills | Status: DC

## 2021-06-13 MED ORDER — SODIUM CHLORIDE (PF) 0.9 % IJ SOLN
INTRAMUSCULAR | Status: AC
  Filled 2021-06-13: qty 50

## 2021-06-13 MED ORDER — HYDROCHLOROTHIAZIDE 12.5 MG PO TABS: 12.5000 mg | ORAL_TABLET | Freq: Every day | ORAL | 3 refills | Status: DC

## 2021-06-13 NOTE — Plan of Care (Signed)

## 2021-06-13 NOTE — Discharge Summary (Signed)
?Physician Discharge Summary ?  ?Patient: Hannah Dougherty MRN: [id number] DOB: 17-Jul-1970  ?Admit date:     06/08/2021  ?Discharge date: 06/13/21  ?Discharge Physician: Oswald Hillock  ? ?PCP: [name], NP  ? ?Recommendations at discharge:  ? ?Follow-up urology as outpatient ? ?Discharge Diagnoses: ?Principal Problem: ?  Hypertensive urgency ?Active Problems: ?  Hyperlipidemia associated with type 2 diabetes mellitus ([place]) ?  Right kidney mass ?  Type 2 diabetes mellitus with hyperglycemia (H[facility]) ?  Class 2 obesity ?  Acute gastroenteritis ? ?Resolved Problems: ?  * No resolved hospital problems. * ? ?Hospital Course: ? ?51 year old female with medical history of class II obesity, diabetes mellitus type 2, hypertension, hyperlipidemia, right kidney mass, active tobacco use came to ER with complaints of nausea, emesis and diarrhea along with abdominal cramping.  Patient has not been taking her medications due to lack of insurance coverage.  In the ED she was found to have blood pressure elevation and was noted with hypertensive urgency. ?In the hospital patient had episode of left hand spasm with left facial droop, slurred speech, symptom lasted for few minutes.  This happened after patient was returning to bed from bedside commode.  Blood pressure at that time was 92/73.  Patient has been having episodes of hypotension earlier yesterday and received multiple fluid boluses.  CT head was negative for stroke.  MRI brain also was negative for stroke. ? ?Assessment and Plan: ? ?Left-sided facial droop/slurred speech ?-Patient had the symptoms yesterday, and are completely resolved ?-Resolved, CT head negative, MRI brain was negative for stroke ?-Likely associated with hypotension ?-Called and discussed with neurologist Dr. Su Monks, who recommended getting MRA head and neck.  MRA head and neck was obtained which was negative for large vessel obstruction.  No further work-up recommended. ? ?Dental cyst/impacted  molar ?-Seen on MRI neck ?-CT maxillofacial with contrast obtained which shows benign dentigerous cyst of the posterior left mandible body corresponds to neck MRI finding and is associated with impacted left mandibular wisdom tooth.  Recommended patient to follow-up with dentist as outpatient. ? ? ?Hypertensive urgency ?-Resolved ?  ?Hypertension ?-Blood pressure is better controlled after cutting down medications ?-Amlodipine was changed to 5 mg daily, Coreg was changed from 12.5 p.o. twice daily to 6.25 twice daily ?-Continue lisinopril, hydrochlorothiazide 12.5 mg daily. ?  ?Diabetes mellitus type 2 ?-Continue sliding scale insulin with NovoLog ?-CBG well controlled ?-We will discharge on Lantus 20 units subcu daily, metformin 1000 mg p.o. twice daily ?  ?Hyponatremia ?-Improved, sodium is 131 ?  ?Hypokalemia ?-Resolved ?  ?Acute gastroenteritis ?-Resolved ?  ?  ?Right kidney mass ?-CT A/P: 2.9 x 2.1 x 2.4 cm enhancing lesion in the lower pole of the right kidney is compatible with a slow growing neoplasm, slightly enlarged compared to the prior study, likely a renal cell carcinoma. This remains encapsulated within Gerota's fascia and is separate from the right renal vein which is widely patent. No lymphadenopathy or definite signs of metastatic disease on today's examination. Nonemergent outpatient referral to Urology for further clinical management is recommended.  Called and discussed with urologist, Dr. Abner Greenspan, he recommends that patient can be discharged with him as outpatient ?  ?Hyperlipidemia ?-Continue Pravachol ?  ?Tobacco abuse ?-Nicotine patch ?  ?Obesity ?Estimated body mass index is 35.08 kg/m? as calculated from the following: ?  Height as of this encounter: '5\' 5"'$  (1.651 m). ?  Weight as of this encounter: 95.6 kg. ?  ? ? ?  ? ? ?  Consultants:  ?Procedures performed:  ?Disposition: Home ?Diet recommendation:  ?Discharge Diet Orders (From admission, onward)  ? ?  Start     Ordered  ? 06/13/21 0000  Diet  - low sodium heart healthy       ? 06/13/21 1408  ? ?  ?  ? ?  ? ?Carb modified diet ?DISCHARGE MEDICATION: ?Allergies as of 06/13/2021   ?No Known Allergies ?  ? ?  ?Medication List  ?  ? ?STOP taking these medications   ? ?glimepiride 4 MG tablet ?Commonly known as: AMARYL ?  ?Trulicity 1.5 WU/8.8BV Sopn ?Generic drug: Dulaglutide ?  ? ?  ? ?TAKE these medications   ? ?amLODipine 5 MG tablet ?Commonly known as: NORVASC ?Take 1 tablet (5 mg total) by mouth daily. ?Start taking on: June 14, 2021 ?What changed:  ?medication strength ?how much to take ?  ?carvedilol 6.25 MG tablet ?Commonly known as: COREG ?Take 1 tablet (6.25 mg total) by mouth 2 (two) times daily with a meal. ?What changed:  ?medication strength ?how much to take ?  ?hydrochlorothiazide 12.5 MG tablet ?Commonly known as: HYDRODIURIL ?Take 1 tablet (12.5 mg total) by mouth daily. ?What changed:  ?medication strength ?how much to take ?  ?insulin glargine 100 UNIT/ML Solostar Pen ?Commonly known as: LANTUS ?Inject 20 Units into the skin at bedtime. ?  ?lisinopril 20 MG tablet ?Commonly known as: ZESTRIL ?Take 1 tablet (20 mg total) by mouth daily. ?Start taking on: June 14, 2021 ?What changed: how much to take ?  ?metFORMIN 1000 MG tablet ?Commonly known as: GLUCOPHAGE ?Take 1 tablet (1,000 mg total) by mouth 2 (two) times daily with a meal. ?  ?nicotine 21 mg/24hr patch ?Commonly known as: Nicoderm CQ ?Place 1 patch (21 mg total) onto the skin daily. ?What changed: Another medication with the same name was removed. Continue taking this medication, and follow the directions you see here. ?  ?pravastatin 40 MG tablet ?Commonly known as: PRAVACHOL ?Take 1 tablet (40 mg total) by mouth daily. ?  ?ReliOn Confirm/micro Test test strip ?Generic drug: glucose blood ?Use as instructed ?  ?Dana ?1 each by Does not apply route 3 (three) times daily. ?  ? ?  ? ? Follow-up Information   ? ? [place] Follow  up.   ?Why: Please call to make an appointment. ?Contact information: ?[address] ?[place] [phone] ?[phone] ? ?  ?  ? ? [facility]. Schedule an appointment as soon as possible for a visit.   ?Contact information: ?[address] ?[place] ?[phone] ? ?  ?  ? ?  ?  ? ?  ? ?Discharge Exam: ?Filed Weights  ? 06/08/21 0435 06/08/21 1356  ?Weight: 102 kg 95.6 kg  ? ?General-appears in no acute distress ?Heart-S1-S2, regular, no murmur auscultated ?Lungs-clear to auscultation bilaterally, no wheezing or crackles auscultated ?Abdomen-soft, nontender, no organomegaly ?Extremities-no edema in the lower extremities ?Neuro-alert, oriented x3, no focal deficit noted ? ?Condition at discharge: good ? ?The results of significant diagnostics from this hospitalization (including imaging, microbiology, ancillary and laboratory) are listed below for reference.  ? ?Imaging Studies: ?CT HEAD WO CONTRAST (5MM) ? ?Result Date: 06/10/2021 ?CLINICAL DATA:  Left-sided weakness and facial numbness. EXAM: CT HEAD WITHOUT CONTRAST TECHNIQUE: Contiguous axial images were obtained from the base of the skull through the vertex without intravenous contrast. RADIATION DOSE REDUCTION:  This exam was performed according to the departmental dose-optimization program which includes automated exposure control, adjustment of the mA and/or kV according to patient size and/or use of iterative reconstruction technique. COMPARISON:  June 08, 2021 and February 18, 2020 FINDINGS: Brain: No evidence of acute infarction, hemorrhage, hydrocephalus, extra-axial collection or mass lesion/mass effect. A very small, stable, chronic appearing area of left occipital lobe cortical and white matter low attenuation is seen (axial CT image 15, CT series 2). Vascular: No hyperdense vessel or unexpected calcification. Skull: Normal. Negative for fracture or focal lesion.  Sinuses/Orbits: No acute finding. Other: None. IMPRESSION: No acute intracranial abnormality. Electronically Signed   By: Virgina Norfolk M.D.   On: 06/10/2021 19:28  ? ?CT HEAD WO CONTRAST (5MM) ? ?Result Date:

## 2021-06-16 ENCOUNTER — Telehealth: Payer: Self-pay

## 2021-06-16 NOTE — Telephone Encounter (Signed)
Transition Care Management Unsuccessful Follow-up Telephone Call ? ?Date of discharge and from where:  06/13/2021, [facility]  ? ?Attempts:  2nd Attempt ? ?Reason for unsuccessful TCM follow-up call:  Unable to reach patient # [phone]. Called twice and the recording stated that the call cannot be completed at this time ? ? ? ?

## 2021-06-17 ENCOUNTER — Telehealth: Payer: Self-pay

## 2021-06-17 NOTE — Telephone Encounter (Signed)
Transition Care Management Unsuccessful Follow-up Telephone Call ? ?Date of discharge and from where:  06/13/2021, [facility] ? ?Attempts:  2nd Attempt ? ?Reason for unsuccessful TCM follow-up call:  Unable to reach patient   Called twice and the recording stated that the call cannot be completed at this time  ? ? ? ?

## 2021-06-18 ENCOUNTER — Telehealth: Payer: Self-pay

## 2021-06-18 NOTE — Telephone Encounter (Signed)
Transition Care Management Unsuccessful Follow-up Telephone Call ? ?Date of discharge and from where:  06/13/2021, [facility]  ? ?Attempts:  3rd Attempt ? ?Reason for unsuccessful TCM follow-up call:  Unable to reach patient # [phone]- the recording stated that the call cannot be completed at this time  ? ? ?Letter sent to patient requesting she contact [facility] to schedule a hospital follow up appointment as we have not been able to reach her.  ? ? ?

## 2021-09-19 ENCOUNTER — Telehealth (INDEPENDENT_AMBULATORY_CARE_PROVIDER_SITE_OTHER): Payer: Self-pay

## 2021-09-19 NOTE — Telephone Encounter (Signed)
Copied from [place] [id number]. Topic: General - Other >> Sep 19, 2021  2:26 PM [place] B wrote: Reason for [id number] wanted to let Dr Oletta Lamas know she will be making an appt to see the urorologist, behind [facility]. She isnt sure of the name of it. She thinks it [facility]

## 2021-10-08 ENCOUNTER — Inpatient Hospital Stay (INDEPENDENT_AMBULATORY_CARE_PROVIDER_SITE_OTHER): Payer: 59 | Admitting: Primary Care

## 2021-11-27 ENCOUNTER — Other Ambulatory Visit: Payer: Self-pay

## 2021-11-27 ENCOUNTER — Emergency Department (HOSPITAL_COMMUNITY)
Admission: EM | Admit: 2021-11-27 | Discharge: 2021-11-28 | Discharge status: Against Medical Advice | Payer: 59 | Attending: Student | Admitting: Student

## 2021-11-27 ENCOUNTER — Encounter (HOSPITAL_COMMUNITY): Payer: Self-pay | Admitting: Emergency Medicine

## 2021-11-27 DIAGNOSIS — Z5321 Procedure and treatment not carried out due to patient leaving prior to being seen by health care provider: Secondary | ICD-10-CM | POA: Insufficient documentation

## 2021-11-27 DIAGNOSIS — K625 Hemorrhage of anus and rectum: Secondary | ICD-10-CM | POA: Insufficient documentation

## 2021-11-27 LAB — COMPREHENSIVE METABOLIC PANEL
ALT: 12 U/L (ref 0–44)
AST: 13 U/L — ABNORMAL LOW (ref 15–41)
Albumin: 3.4 g/dL — ABNORMAL LOW (ref 3.5–5.0)
Alkaline Phosphatase: 77 U/L (ref 38–126)
Anion gap: 8 (ref 5–15)
BUN: 19 mg/dL (ref 6–20)
CO2: 25 mmol/L (ref 22–32)
Calcium: 8.7 mg/dL — ABNORMAL LOW (ref 8.9–10.3)
Chloride: 103 mmol/L (ref 98–111)
Creatinine, Ser: 1.36 mg/dL — ABNORMAL HIGH (ref 0.44–1.00)
GFR, Estimated: 47 mL/min — ABNORMAL LOW (ref >60)
Glucose, Bld: 198 mg/dL — ABNORMAL HIGH (ref 70–99)
Potassium: 3.6 mmol/L (ref 3.5–5.1)
Sodium: 136 mmol/L (ref 135–145)
Total Bilirubin: 0.5 mg/dL (ref 0.3–1.2)
Total Protein: 6.6 g/dL (ref 6.5–8.1)

## 2021-11-27 LAB — CBC WITH DIFFERENTIAL/PLATELET
Abs Immature Granulocytes: 0.01 10*3/uL (ref 0.00–0.07)
Basophils Absolute: 0 10*3/uL (ref 0.0–0.1)
Basophils Relative: 0 %
Eosinophils Absolute: 0.2 10*3/uL (ref 0.0–0.5)
Eosinophils Relative: 3 %
HCT: 35 % — ABNORMAL LOW (ref 36.0–46.0)
Hemoglobin: 11.2 g/dL — ABNORMAL LOW (ref 12.0–15.0)
Immature Granulocytes: 0 %
Lymphocytes Relative: 39 %
Lymphs Abs: 3 10*3/uL (ref 0.7–4.0)
MCH: 27.5 pg (ref 26.0–34.0)
MCHC: 32 g/dL (ref 30.0–36.0)
MCV: 85.8 fL (ref 80.0–100.0)
Monocytes Absolute: 0.6 10*3/uL (ref 0.1–1.0)
Monocytes Relative: 8 %
Neutro Abs: 3.9 10*3/uL (ref 1.7–7.7)
Neutrophils Relative %: 50 %
Platelets: 241 10*3/uL (ref 150–400)
RBC: 4.08 MIL/uL (ref 3.87–5.11)
RDW: 14.6 % (ref 11.5–15.5)
WBC: 7.8 10*3/uL (ref 4.0–10.5)
nRBC: 0 % (ref 0.0–0.2)

## 2021-11-27 NOTE — ED Triage Notes (Signed)
Pt reported to ED with c/o blood in stools x2 weeks. Pt states that today she "noticed more than she expected". Denies any pain, shortness of breath, dizziness or nausea/vomiting at this time.

## 2021-11-27 NOTE — ED Notes (Signed)
Pt decided to leave 

## 2021-11-27 NOTE — ED Provider Triage Note (Signed)
Emergency Medicine Provider Triage Evaluation Note  Hannah Dougherty , a 51 y.o. female  was evaluated in triage.  Pt complains of rectal bleeding intermittently for 2 weeks.  States sometimes it is dark, other times it is bright red.  Denies any pain, nausea, vomiting, use of blood thinners.  Denies any known history of colon cancer, unsure last colonoscopy..  Review of Systems  Per HPI  Physical Exam  BP (!) 169/86 (BP Location: Right Arm)   Pulse 66   Temp 98.8 F (37.1 C) (Oral)   Resp 19   LMP 10/28/2016 (Within Weeks)   SpO2 97%  Gen:   Awake, no distress   Resp:  Normal effort  MSK:   Moves extremities without difficulty  Other:  Abdomen is soft nontender  Medical Decision Making  Medically screening exam initiated at 7:37 PM.  Appropriate orders placed.  Hannah Dougherty was informed that the remainder of the evaluation will be completed by another provider, this initial triage assessment does not replace that evaluation, and the importance of remaining in the ED until their evaluation is complete.     Sherrill Raring, PA-C 11/27/21 1938

## 2021-11-29 ENCOUNTER — Other Ambulatory Visit: Payer: Self-pay

## 2021-11-29 ENCOUNTER — Encounter (HOSPITAL_COMMUNITY): Payer: Self-pay

## 2021-11-29 ENCOUNTER — Inpatient Hospital Stay (HOSPITAL_COMMUNITY)
Admission: EM | Admit: 2021-11-29 | Discharge: 2021-12-05 | DRG: 378 | Disposition: A | Discharge status: Home / Self Care | Payer: Self-pay | Attending: Internal Medicine | Admitting: Internal Medicine

## 2021-11-29 DIAGNOSIS — F17210 Nicotine dependence, cigarettes, uncomplicated: Secondary | ICD-10-CM | POA: Diagnosis present

## 2021-11-29 DIAGNOSIS — E782 Mixed hyperlipidemia: Secondary | ICD-10-CM

## 2021-11-29 DIAGNOSIS — Z597 Insufficient social insurance and welfare support: Secondary | ICD-10-CM

## 2021-11-29 DIAGNOSIS — K5730 Diverticulosis of large intestine without perforation or abscess without bleeding: Secondary | ICD-10-CM | POA: Diagnosis present

## 2021-11-29 DIAGNOSIS — K921 Melena: Principal | ICD-10-CM | POA: Diagnosis present

## 2021-11-29 DIAGNOSIS — Z841 Family history of disorders of kidney and ureter: Secondary | ICD-10-CM

## 2021-11-29 DIAGNOSIS — E669 Obesity, unspecified: Secondary | ICD-10-CM | POA: Diagnosis present

## 2021-11-29 DIAGNOSIS — N2889 Other specified disorders of kidney and ureter: Secondary | ICD-10-CM | POA: Diagnosis present

## 2021-11-29 DIAGNOSIS — Z79899 Other long term (current) drug therapy: Secondary | ICD-10-CM

## 2021-11-29 DIAGNOSIS — Z833 Family history of diabetes mellitus: Secondary | ICD-10-CM

## 2021-11-29 DIAGNOSIS — Z5989 Other problems related to housing and economic circumstances: Secondary | ICD-10-CM

## 2021-11-29 DIAGNOSIS — I701 Atherosclerosis of renal artery: Secondary | ICD-10-CM | POA: Diagnosis present

## 2021-11-29 DIAGNOSIS — E119 Type 2 diabetes mellitus without complications: Secondary | ICD-10-CM | POA: Diagnosis present

## 2021-11-29 DIAGNOSIS — Z8249 Family history of ischemic heart disease and other diseases of the circulatory system: Secondary | ICD-10-CM

## 2021-11-29 DIAGNOSIS — Z6834 Body mass index (BMI) 34.0-34.9, adult: Secondary | ICD-10-CM

## 2021-11-29 DIAGNOSIS — K922 Gastrointestinal hemorrhage, unspecified: Principal | ICD-10-CM

## 2021-11-29 DIAGNOSIS — I10 Essential (primary) hypertension: Secondary | ICD-10-CM | POA: Diagnosis present

## 2021-11-29 DIAGNOSIS — Z794 Long term (current) use of insulin: Secondary | ICD-10-CM

## 2021-11-29 DIAGNOSIS — I160 Hypertensive urgency: Secondary | ICD-10-CM | POA: Diagnosis present

## 2021-11-29 DIAGNOSIS — E871 Hypo-osmolality and hyponatremia: Secondary | ICD-10-CM | POA: Diagnosis not present

## 2021-11-29 DIAGNOSIS — K635 Polyp of colon: Secondary | ICD-10-CM | POA: Diagnosis present

## 2021-11-29 DIAGNOSIS — Z7984 Long term (current) use of oral hypoglycemic drugs: Secondary | ICD-10-CM

## 2021-11-29 DIAGNOSIS — E876 Hypokalemia: Secondary | ICD-10-CM | POA: Diagnosis not present

## 2021-11-29 LAB — COMPREHENSIVE METABOLIC PANEL
ALT: 12 U/L (ref 0–44)
AST: 14 U/L — ABNORMAL LOW (ref 15–41)
Albumin: 3.5 g/dL (ref 3.5–5.0)
Alkaline Phosphatase: 82 U/L (ref 38–126)
Anion gap: 6 (ref 5–15)
BUN: 18 mg/dL (ref 6–20)
CO2: 28 mmol/L (ref 22–32)
Calcium: 8.9 mg/dL (ref 8.9–10.3)
Chloride: 105 mmol/L (ref 98–111)
Creatinine, Ser: 1.11 mg/dL — ABNORMAL HIGH (ref 0.44–1.00)
GFR, Estimated: 59 mL/min — ABNORMAL LOW (ref >60)
Glucose, Bld: 281 mg/dL — ABNORMAL HIGH (ref 70–99)
Potassium: 3.8 mmol/L (ref 3.5–5.1)
Sodium: 139 mmol/L (ref 135–145)
Total Bilirubin: 0.5 mg/dL (ref 0.3–1.2)
Total Protein: 7 g/dL (ref 6.5–8.1)

## 2021-11-29 LAB — CBC
HCT: 34.8 % — ABNORMAL LOW (ref 36.0–46.0)
Hemoglobin: 11.2 g/dL — ABNORMAL LOW (ref 12.0–15.0)
MCH: 27.9 pg (ref 26.0–34.0)
MCHC: 32.2 g/dL (ref 30.0–36.0)
MCV: 86.8 fL (ref 80.0–100.0)
Platelets: 239 10*3/uL (ref 150–400)
RBC: 4.01 MIL/uL (ref 3.87–5.11)
RDW: 14.5 % (ref 11.5–15.5)
WBC: 7.1 10*3/uL (ref 4.0–10.5)
nRBC: 0 % (ref 0.0–0.2)

## 2021-11-29 LAB — TYPE AND SCREEN
ABO/RH(D): O POS
Antibody Screen: NEGATIVE

## 2021-11-29 NOTE — ED Provider Triage Note (Signed)
Emergency Medicine Provider Triage Evaluation Note  Hannah Dougherty , a 51 y.o. female  was evaluated in triage.  Pt complains of melena for 2 weeks.  Not on blood thinners.  Never had a colonoscopy.  No history of hemorrhoids.  Review of Systems  Positive: Melena Negative: Rectal pain, hemorrhoids, blood thinners, dizziness, fatigue, shortness of breath  Physical Exam  BP (!) 212/106   Pulse 68   Temp 99 F (37.2 C) (Oral)   Resp 17   Ht '5\' 5"'$  (1.651 m)   Wt 93 kg   LMP 10/28/2016 (Within Weeks)   SpO2 98%   BMI 34.11 kg/m  Gen:   Awake, no distress   Resp:  Normal effort  MSK:   Moves extremities without difficulty  Other:    Medical Decision Making  Medically screening exam initiated at 9:58 PM.  Appropriate orders placed.  Hannah Dougherty was informed that the remainder of the evaluation will be completed by another provider, this initial triage assessment does not replace that evaluation, and the importance of remaining in the ED until their evaluation is complete.    Hypertensive in triage.  She reports that she did not take her medication for her known hypertension today.  Labs ordered   Darliss Ridgel 11/29/21 2159

## 2021-11-29 NOTE — ED Triage Notes (Signed)
Ambulatory to ED with c/o bloody stools x 2 weeks. States it's a "small to medium amount" and "dark" both in the toilet and after she wipes. Denies shortness of breath, path, dizziness, or fatigue.

## 2021-11-30 ENCOUNTER — Observation Stay (HOSPITAL_COMMUNITY): Payer: Self-pay

## 2021-11-30 ENCOUNTER — Inpatient Hospital Stay (HOSPITAL_COMMUNITY): Payer: Self-pay

## 2021-11-30 ENCOUNTER — Encounter (HOSPITAL_COMMUNITY): Payer: Self-pay | Admitting: Internal Medicine

## 2021-11-30 DIAGNOSIS — I160 Hypertensive urgency: Secondary | ICD-10-CM

## 2021-11-30 DIAGNOSIS — K921 Melena: Secondary | ICD-10-CM

## 2021-11-30 DIAGNOSIS — Z794 Long term (current) use of insulin: Secondary | ICD-10-CM

## 2021-11-30 DIAGNOSIS — N2889 Other specified disorders of kidney and ureter: Secondary | ICD-10-CM

## 2021-11-30 DIAGNOSIS — E119 Type 2 diabetes mellitus without complications: Secondary | ICD-10-CM

## 2021-11-30 DIAGNOSIS — K922 Gastrointestinal hemorrhage, unspecified: Secondary | ICD-10-CM

## 2021-11-30 DIAGNOSIS — Z5989 Other problems related to housing and economic circumstances: Secondary | ICD-10-CM

## 2021-11-30 DIAGNOSIS — I10 Essential (primary) hypertension: Secondary | ICD-10-CM

## 2021-11-30 LAB — CBG MONITORING, ED
Glucose-Capillary: 295 mg/dL — ABNORMAL HIGH (ref 70–99)
Glucose-Capillary: 168 mg/dL — ABNORMAL HIGH (ref 70–99)

## 2021-11-30 LAB — CBC
HCT: 33 % — ABNORMAL LOW (ref 36.0–46.0)
Hemoglobin: 10.8 g/dL — ABNORMAL LOW (ref 12.0–15.0)
MCH: 28 pg (ref 26.0–34.0)
MCHC: 32.7 g/dL (ref 30.0–36.0)
MCV: 85.5 fL (ref 80.0–100.0)
Platelets: 212 10*3/uL (ref 150–400)
RBC: 3.86 MIL/uL — ABNORMAL LOW (ref 3.87–5.11)
RDW: 14.4 % (ref 11.5–15.5)
WBC: 7.3 10*3/uL (ref 4.0–10.5)
nRBC: 0 % (ref 0.0–0.2)

## 2021-11-30 LAB — GLUCOSE, CAPILLARY
Glucose-Capillary: 220 mg/dL — ABNORMAL HIGH (ref 70–99)
Glucose-Capillary: 282 mg/dL — ABNORMAL HIGH (ref 70–99)
Glucose-Capillary: 299 mg/dL — ABNORMAL HIGH (ref 70–99)

## 2021-11-30 LAB — BASIC METABOLIC PANEL
Anion gap: 5 (ref 5–15)
BUN: 18 mg/dL (ref 6–20)
CO2: 26 mmol/L (ref 22–32)
Calcium: 8.4 mg/dL — ABNORMAL LOW (ref 8.9–10.3)
Chloride: 103 mmol/L (ref 98–111)
Creatinine, Ser: 0.99 mg/dL (ref 0.44–1.00)
GFR, Estimated: >60 mL/min (ref >60)
Glucose, Bld: 277 mg/dL — ABNORMAL HIGH (ref 70–99)
Potassium: 4 mmol/L (ref 3.5–5.1)
Sodium: 134 mmol/L — ABNORMAL LOW (ref 135–145)

## 2021-11-30 LAB — ABO/RH: ABO/RH(D): O POS

## 2021-11-30 LAB — OCCULT BLOOD X 1 CARD TO LAB, STOOL: Fecal Occult Bld: POSITIVE — AB

## 2021-11-30 LAB — MRSA NEXT GEN BY PCR, NASAL: MRSA by PCR Next Gen: NOT DETECTED

## 2021-11-30 MED ORDER — IOHEXOL 350 MG/ML SOLN
100.0000 mL | Freq: Once | INTRAVENOUS | Status: AC | PRN
  Administered 2021-11-30: 100 mL via INTRAVENOUS

## 2021-11-30 MED ORDER — HYDRALAZINE HCL 25 MG PO TABS
25.0000 mg | ORAL_TABLET | ORAL | Status: DC | PRN
  Administered 2021-11-30: 25 mg via ORAL
  Filled 2021-11-30: qty 1

## 2021-11-30 MED ORDER — ONDANSETRON HCL 4 MG PO TABS: 4.0000 mg | ORAL_TABLET | Freq: Four times a day (QID) | ORAL | Status: DC | PRN

## 2021-11-30 MED ORDER — NICARDIPINE HCL IN NACL 20-0.86 MG/200ML-% IV SOLN
3.0000 mg/h | INTRAVENOUS | Status: DC
  Administered 2021-11-30: 5 mg/h via INTRAVENOUS
  Administered 2021-11-30: 7.5 mg/h via INTRAVENOUS
  Administered 2021-11-30 (×2): 12.5 mg/h via INTRAVENOUS
  Administered 2021-12-01: 7.5 mg/h via INTRAVENOUS
  Administered 2021-12-01: 10 mg/h via INTRAVENOUS
  Administered 2021-12-01: 12.5 mg/h via INTRAVENOUS
  Administered 2021-12-01: 10 mg/h via INTRAVENOUS
  Administered 2021-12-01: 7.5 mg/h via INTRAVENOUS
  Administered 2021-12-01: 10 mg/h via INTRAVENOUS
  Administered 2021-12-01: 7.5 mg/h via INTRAVENOUS
  Administered 2021-12-01: 10 mg/h via INTRAVENOUS
  Administered 2021-12-01: 12.5 mg/h via INTRAVENOUS
  Administered 2021-12-01 – 2021-12-02 (×2): 15 mg/h via INTRAVENOUS
  Administered 2021-12-02: 5 mg/h via INTRAVENOUS
  Administered 2021-12-02: 15 mg/h via INTRAVENOUS
  Administered 2021-12-02: 5 mg/h via INTRAVENOUS
  Administered 2021-12-02: 1 mg/h via INTRAVENOUS
  Filled 2021-11-30 (×28): qty 200

## 2021-11-30 MED ORDER — AMLODIPINE BESYLATE 5 MG PO TABS
5.0000 mg | ORAL_TABLET | Freq: Every day | ORAL | Status: DC
  Administered 2021-11-30: 5 mg via ORAL
  Filled 2021-11-30: qty 1

## 2021-11-30 MED ORDER — ONDANSETRON HCL 4 MG/2ML IJ SOLN
4.0000 mg | Freq: Four times a day (QID) | INTRAMUSCULAR | Status: DC | PRN
  Administered 2021-11-30 – 2021-12-03 (×6): 4 mg via INTRAVENOUS
  Filled 2021-11-30 (×6): qty 2

## 2021-11-30 MED ORDER — ACETAMINOPHEN 325 MG PO TABS
650.0000 mg | ORAL_TABLET | Freq: Four times a day (QID) | ORAL | Status: DC | PRN
  Administered 2021-11-30: 650 mg via ORAL
  Filled 2021-11-30: qty 2

## 2021-11-30 MED ORDER — LABETALOL HCL 5 MG/ML IV SOLN
10.0000 mg | INTRAVENOUS | Status: DC | PRN
  Filled 2021-11-30: qty 4

## 2021-11-30 MED ORDER — CHLORHEXIDINE GLUCONATE CLOTH 2 % EX PADS
6.0000 | MEDICATED_PAD | Freq: Every day | CUTANEOUS | Status: DC
  Administered 2021-12-01 – 2021-12-03 (×3): 6 via TOPICAL

## 2021-11-30 MED ORDER — AMLODIPINE BESYLATE 5 MG PO TABS
5.0000 mg | ORAL_TABLET | Freq: Once | ORAL | Status: AC
  Administered 2021-11-30: 5 mg via ORAL
  Filled 2021-11-30: qty 1

## 2021-11-30 MED ORDER — HYDRALAZINE HCL 20 MG/ML IJ SOLN
20.0000 mg | INTRAMUSCULAR | Status: DC | PRN
  Administered 2021-11-30: 20 mg via INTRAVENOUS
  Filled 2021-11-30: qty 1

## 2021-11-30 MED ORDER — INSULIN ASPART 100 UNIT/ML IJ SOLN
0.0000 [IU] | INTRAMUSCULAR | Status: DC
  Administered 2021-11-30: 5 [IU] via SUBCUTANEOUS
  Administered 2021-11-30: 3 [IU] via SUBCUTANEOUS
  Administered 2021-11-30: 2 [IU] via SUBCUTANEOUS
  Administered 2021-11-30 (×2): 5 [IU] via SUBCUTANEOUS
  Administered 2021-12-01: 7 [IU] via SUBCUTANEOUS
  Administered 2021-12-01: 5 [IU] via SUBCUTANEOUS
  Filled 2021-11-30: qty 0.09

## 2021-11-30 MED ORDER — METHOCARBAMOL 1000 MG/10ML IJ SOLN: 500.0000 mg | Freq: Four times a day (QID) | INTRAVENOUS | Status: DC | PRN

## 2021-11-30 MED ORDER — CARVEDILOL 6.25 MG PO TABS
6.2500 mg | ORAL_TABLET | Freq: Two times a day (BID) | ORAL | Status: DC
  Administered 2021-11-30: 6.25 mg via ORAL
  Filled 2021-11-30: qty 2

## 2021-11-30 MED ORDER — ACETAMINOPHEN 650 MG RE SUPP: 650.0000 mg | Freq: Four times a day (QID) | RECTAL | Status: DC | PRN

## 2021-11-30 MED ORDER — ORAL CARE MOUTH RINSE: 15.0000 mL | OROMUCOSAL | Status: DC | PRN

## 2021-11-30 MED ORDER — AMLODIPINE BESYLATE 10 MG PO TABS
10.0000 mg | ORAL_TABLET | Freq: Every day | ORAL | Status: DC
  Administered 2021-12-01 – 2021-12-04 (×4): 10 mg via ORAL
  Filled 2021-11-30 (×5): qty 1

## 2021-11-30 MED ORDER — HYDRALAZINE HCL 20 MG/ML IJ SOLN
10.0000 mg | Freq: Once | INTRAMUSCULAR | Status: AC
  Administered 2021-11-30: 10 mg via INTRAVENOUS
  Filled 2021-11-30: qty 1

## 2021-11-30 NOTE — ED Provider Notes (Signed)
[facility] DEPT Provider Note: Hannah Spurling, MD, FACEP  CSN: [id number] MRN: [id number] ARRIVAL: 11/29/21 at 2146 ROOM: [place]  Rectal Bleeding   HISTORY OF PRESENT ILLNESS  11/30/21 2:41 AM Hannah Dougherty is a 51 y.o. female about 2 weeks of rectal bleeding.  She describes the bleeding as a small to medium amount of dark blood.  She sees it in the toilet bowl both on and mixed in with the feces, and on the toilet tissue when she wipes.  She is not having any weakness, lightheadedness, shortness of breath or abdominal pain.    Past Medical History:  Diagnosis Date   Diabetes mellitus without complication ([place])    Hypertension     Past Surgical History:  Procedure Laterality Date   TUBAL LIGATION     VOCAL CORD LATERALIZATION, ENDOSCOPIC APPROACH W/ MLB      Family History  Problem Relation Age of Onset   Hypertension Mother    Diabetes Mother    Kidney disease Mother    Diabetes Father    Hypertension Father    Hypertension Maternal Grandmother    Diabetes Maternal Grandmother    Breast cancer Maternal Grandmother     Social History   Tobacco Use   Smoking status: Some Days    Packs/day: 0.50    Types: Cigarettes   Smokeless tobacco: Never  Substance Use Topics   Alcohol use: Yes   Drug use: Yes    Types: Marijuana    Prior to Admission medications   Medication Sig Start Date End Date Taking? Authorizing Provider  amLODipine (NORVASC) 5 MG tablet Take 1 tablet (5 mg total) by mouth daily. 06/14/21   Oswald Hillock, MD  carvedilol (COREG) 6.25 MG tablet Take 1 tablet (6.25 mg total) by mouth 2 (two) times daily with a meal. 06/13/21   Oswald Hillock, MD  glucose blood (RELION CONFIRM/MICRO TEST) test strip Use as instructed 12/11/18   Asencion Noble, MD  hydrochlorothiazide (HYDRODIURIL) 12.5 MG tablet Take 1 tablet (12.5 mg total) by mouth daily. 06/13/21   Oswald Hillock, MD  insulin glargine (LANTUS) 100 UNIT/ML Solostar Pen Inject 20  Units into the skin at bedtime. 06/13/21   Oswald Hillock, MD  lisinopril (ZESTRIL) 20 MG tablet Take 1 tablet (20 mg total) by mouth daily. 06/14/21   Oswald Hillock, MD  metFORMIN (GLUCOPHAGE) 1000 MG tablet Take 1 tablet (1,000 mg total) by mouth 2 (two) times daily with a meal. 06/13/21   Oswald Hillock, MD  nicotine (NICODERM CQ) 21 mg/24hr patch Place 1 patch (21 mg total) onto the skin daily. Patient not taking: Reported on 06/08/2021 01/07/21   [name], NP  pravastatin (PRAVACHOL) 40 MG tablet Take 1 tablet (40 mg total) by mouth daily. 06/13/21   Oswald Hillock, MD  ReliOn Lancet Devices 30G MISC 1 each by Does not apply route 3 (three) times daily. 12/11/18   Asencion Noble, MD    Allergies Patient has no known allergies.   REVIEW OF SYSTEMS  Negative except as noted here or in the History of Present Illness.   PHYSICAL EXAMINATION  Initial Vital Signs Blood pressure (!) 231/98, pulse 60, temperature 98 F (36.7 C), temperature source Oral, resp. rate 17, height '5\' 5"'$  (1.651 m), weight 93 kg, last menstrual period 10/28/2016, SpO2 98 %.  Examination General: Well-developed, well-nourished female in no acute distress; appearance consistent with age of record HENT: normocephalic; atraumatic  Eyes: No scleral icterus Neck: supple Heart: regular rate and rhythm Lungs: clear to auscultation bilaterally Abdomen: soft; nondistended; nontender; bowel sounds present Rectal: Normal sphincter tone (see anoscopy note below) Extremities: No deformity; full range of motion; pulses normal Neurologic: Awake, alert and oriented; motor function intact in all extremities and symmetric; no facial droop Skin: Warm and dry Psychiatric: Normal mood and affect   RESULTS  Summary of this visit's results, reviewed and interpreted by myself:   EKG Interpretation  Date/Time:    Ventricular Rate:    PR Interval:    QRS Duration:   QT Interval:    QTC Calculation:   R Axis:     Text  Interpretation:         Laboratory Studies: Results for orders placed or performed during the hospital encounter of 11/29/21 (from the past 24 hour(s))  Type and screen [place]     Status: None   Collection Time: 11/29/21  9:15 PM  Result Value Ref Range   ABO/RH(D) O POS    Antibody Screen NEG    Sample Expiration      12/02/2021,2359 Performed at [facility], [place] [address] [place], [place] [address]   Comprehensive metabolic panel     Status: Abnormal   Collection Time: 11/29/21 10:18 PM  Result Value Ref Range   Sodium 139 135 - 145 mmol/L   Potassium 3.8 3.5 - 5.1 mmol/L   Chloride 105 98 - 111 mmol/L   CO2 28 22 - 32 mmol/L   Glucose, Bld 281 (H) 70 - 99 mg/dL   BUN 18 6 - 20 mg/dL   Creatinine, Ser 1.11 (H) 0.44 - 1.00 mg/dL   Calcium 8.9 8.9 - 10.3 mg/dL   Total Protein 7.0 6.5 - 8.1 g/dL   Albumin 3.5 3.5 - 5.0 g/dL   AST 14 (L) 15 - 41 U/L   ALT 12 0 - 44 U/L   Alkaline Phosphatase 82 38 - 126 U/L   Total Bilirubin 0.5 0.3 - 1.2 mg/dL   GFR, Estimated 59 (L) >60 mL/min   Anion gap 6 5 - 15  CBC     Status: Abnormal   Collection Time: 11/29/21 10:18 PM  Result Value Ref Range   WBC 7.1 4.0 - 10.5 K/uL   RBC 4.01 3.87 - 5.11 MIL/uL   Hemoglobin 11.2 (L) 12.0 - 15.0 g/dL   HCT 34.8 (L) 36.0 - 46.0 %   MCV 86.8 80.0 - 100.0 fL   MCH 27.9 26.0 - 34.0 pg   MCHC 32.2 30.0 - 36.0 g/dL   RDW 14.5 11.5 - 15.5 %   Platelets 239 150 - 400 K/uL   nRBC 0.0 0.0 - 0.2 %   Imaging Studies: No results found.  ED COURSE and MDM  Nursing notes, initial and subsequent vitals signs, including pulse oximetry, reviewed and interpreted by myself.  Vitals:   11/29/21 2151 11/29/21 2153 11/30/21 0121  BP:  (!) 212/106 (!) 231/98  Pulse: 68  60  Resp: '17 17 17  '$ Temp: 99 F (37.2 C)  98 F (36.7 C)  TempSrc: Oral  Oral  SpO2: 98%  98%  Weight: 93 kg    Height: '5\' 5"'$  (1.651 m)     Medications - No data to display  3:55  AM After Alcario Drought to admit to the hospitalist service.  I was unable to determine the source of her bleeding on anoscopy.  Dr. Alcario Drought has ordered a CT  angio GI bleed protocol.  PROCEDURES  Anoscopy  Date/Time: 11/30/2021 3:03 AM  Performed by: Trulee Hamstra, MD Authorized by: Lilleigh Hechavarria, MD   Consent:    Consent obtained:  Verbal   Consent given by:  Patient   Risks, benefits, and alternatives were discussed: yes     Risks discussed:  Pain Universal protocol:    Procedure explained and questions answered to patient or proxy's satisfaction: yes     Required blood products, implants, devices, and special equipment available: yes     Immediately prior to procedure, a time out was called: yes     Patient identity confirmed:  Verbally with patient Indications:    Indications: rectal bleeding   Procedure details:    Internal hemorrhoids: no     Inflammation: no     Anal fissures: no     Anal fistulae: no     Anal stricture: no     Abscess: no     Tearing: no     Blood in rectal vault: yes   Post-procedure details:    Procedure completion:  Tolerated well, no immediate complications  ED DIAGNOSES     ICD-10-CM   1. Lower GI bleed  K92.2          Asuka Dusseau, MD 11/30/21 [phone]

## 2021-11-30 NOTE — Progress Notes (Signed)
RN notified of need for patient to be NPO after midnight for renal artery duplex in the morning.

## 2021-11-30 NOTE — Assessment & Plan Note (Addendum)
-   Per patient, appears that her insurance company, [employer] has gone bankrupt.  She has been unable to follow-up with outpatient providers or afford medications -Discussed with patient to talk with social work about Medicaid eligibility and other options

## 2021-11-30 NOTE — Progress Notes (Signed)
Pt arrived to floor approximately 1200, diaphoretic, nauseous, vomiting clear liquid emesis, no signs of blood.    Pt BP 232/110, MD notified of BP and symptoms. Labetalol held due to bradycardia.  PO and IV hydralazine given per MD.    1330 - BP 226/93, HR 57; MD notified.  Pt transferred to SDU in case pt required cardene gtt.  Report given to [facility].

## 2021-11-30 NOTE — Assessment & Plan Note (Addendum)
-   present dating back to CT A/P on 02/16/20; truly unclear how much she's pursued treating this; presumption certainly is RCC - mass measured 2.4 x 1.7 x 2.6 in 02/2020 and now measures 2.4 x 3.1 cm on current CTA from 11/30/21 - case discussed with Dr. Abner Greenspan on admission by admitting provider; patient trying to being arranged in the outpatient clinic

## 2021-11-30 NOTE — Social Work (Signed)
CSW emailed Shanon Rosser about getting Pt medicaid.

## 2021-11-30 NOTE — Assessment & Plan Note (Addendum)
-   Last A1c 13.2% on 06/08/2021 - Repeat A1c - Continue SSI and CBG monitoring

## 2021-11-30 NOTE — Progress Notes (Signed)
  Request seen for angiography for active GI bleed.  CTA images reviewed by Dr. Kathlene Cote. Chart reviewed . Hgb 7.3   He does NOT feel CTA is positive at the level of the rectum, there is questionable enhancement but not grossly positive extravasation.   No indication for angiography/embo at this time, Consider Colonoscopy.  Chrystopher Stangl S Angle Karel PA-C 11/30/2021 8:46 AM

## 2021-11-30 NOTE — H&P (View-Only) (Signed)
Reason for Consult: Bright red blood per rectum abnormal CT Referring Physician: Hospital team  Hannah Dougherty is an 51 y.o. female.  HPI: Patient seen and examined in her hospital computer chart reviewed and unfortunately her heart pill taking on an empty stomach just gave her some nausea vomiting and we discussed proceeding with a colon prep but she just does not feel up to it today and she has had bright red blood for a week or 2 has no problems with her bowels and her family history is negative from a GI standpoint and we also discussed her kidney finding and she has no other complaints  Past Medical History:  Diagnosis Date   Diabetes mellitus without complication ([place])    Hypertension     Past Surgical History:  Procedure Laterality Date   TUBAL LIGATION     VOCAL CORD LATERALIZATION, ENDOSCOPIC APPROACH W/ MLB      Family History  Problem Relation Age of Onset   Hypertension Mother    Diabetes Mother    Kidney disease Mother    Diabetes Father    Hypertension Father    Hypertension Maternal Grandmother    Diabetes Maternal Grandmother    Breast cancer Maternal Grandmother     Social History:  reports that she has been smoking cigarettes. She has been smoking an average of .5 packs per day. She has never used smokeless tobacco. She reports current alcohol use. She reports current drug use. Drug: Marijuana.  Allergies: No Known Allergies  Medications: I have reviewed the patient's current medications.  Results for orders placed or performed during the hospital encounter of 11/29/21 (from the past 48 hour(s))  Type and screen [place]     Status: None   Collection Time: 11/29/21  9:15 PM  Result Value Ref Range   ABO/RH(D) O POS    Antibody Screen NEG    Sample Expiration      12/02/2021,2359 Performed at [facility], [place] [address], [place], [place] [address]   Comprehensive metabolic panel     Status: Abnormal   Collection  Time: 11/29/21 10:18 PM  Result Value Ref Range   Sodium 139 135 - 145 mmol/L   Potassium 3.8 3.5 - 5.1 mmol/L   Chloride 105 98 - 111 mmol/L   CO2 28 22 - 32 mmol/L   Glucose, Bld 281 (H) 70 - 99 mg/dL    Comment: Glucose reference range applies only to samples taken after fasting for at least 8 hours.   BUN 18 6 - 20 mg/dL   Creatinine, Ser 1.11 (H) 0.44 - 1.00 mg/dL   Calcium 8.9 8.9 - 10.3 mg/dL   Total Protein 7.0 6.5 - 8.1 g/dL   Albumin 3.5 3.5 - 5.0 g/dL   AST 14 (L) 15 - 41 U/L   ALT 12 0 - 44 U/L   Alkaline Phosphatase 82 38 - 126 U/L   Total Bilirubin 0.5 0.3 - 1.2 mg/dL   GFR, Estimated 59 (L) >60 mL/min    Comment: (NOTE) Calculated using the CKD-EPI Creatinine Equation (2021)    Anion gap 6 5 - 15    Comment: Performed at [facility], [place] [address], [place], [place] [address]  CBC     Status: Abnormal   Collection Time: 11/29/21 10:18 PM  Result Value Ref Range   WBC 7.1 4.0 - 10.5 K/uL   RBC 4.01 3.87 - 5.11 MIL/uL   Hemoglobin 11.2 (L) 12.0 - 15.0  g/dL   HCT 34.8 (L) 36.0 - 46.0 %   MCV 86.8 80.0 - 100.0 fL   MCH 27.9 26.0 - 34.0 pg   MCHC 32.2 30.0 - 36.0 g/dL   RDW 14.5 11.5 - 15.5 %   Platelets 239 150 - 400 K/uL   nRBC 0.0 0.0 - 0.2 %    Comment: Performed at [facility], [place] [address], [place], [place] [address]  CBG monitoring, ED     Status: Abnormal   Collection Time: 11/30/21  4:27 AM  Result Value Ref Range   Glucose-Capillary 295 (H) 70 - 99 mg/dL    Comment: Glucose reference range applies only to samples taken after fasting for at least 8 hours.  ABO/Rh     Status: None   Collection Time: 11/30/21  4:50 AM  Result Value Ref Range   ABO/RH(D)      O POS Performed at [facility], [place] [address], [place], [place] [address]   CBC     Status: Abnormal   Collection Time: 11/30/21  4:50 AM  Result Value Ref Range   WBC 7.3 4.0 - 10.5 K/uL   RBC 3.86 (L) 3.87 - 5.11 MIL/uL    Hemoglobin 10.8 (L) 12.0 - 15.0 g/dL   HCT 33.0 (L) 36.0 - 46.0 %   MCV 85.5 80.0 - 100.0 fL   MCH 28.0 26.0 - 34.0 pg   MCHC 32.7 30.0 - 36.0 g/dL   RDW 14.4 11.5 - 15.5 %   Platelets 212 150 - 400 K/uL   nRBC 0.0 0.0 - 0.2 %    Comment: Performed at [facility], [place] [address] [place], [place] [address]  Basic metabolic panel     Status: Abnormal   Collection Time: 11/30/21  4:50 AM  Result Value Ref Range   Sodium 134 (L) 135 - 145 mmol/L   Potassium 4.0 3.5 - 5.1 mmol/L   Chloride 103 98 - 111 mmol/L   CO2 26 22 - 32 mmol/L   Glucose, Bld 277 (H) 70 - 99 mg/dL    Comment: Glucose reference range applies only to samples taken after fasting for at least 8 hours.   BUN 18 6 - 20 mg/dL   Creatinine, Ser 0.99 0.44 - 1.00 mg/dL   Calcium 8.4 (L) 8.9 - 10.3 mg/dL   GFR, Estimated >60 >60 mL/min    Comment: (NOTE) Calculated using the CKD-EPI Creatinine Equation (2021)    Anion gap 5 5 - 15    Comment: Performed at [facility], [place] [address] [place], [place] [address]  CBG monitoring, ED     Status: Abnormal   Collection Time: 11/30/21  9:22 AM  Result Value Ref Range   Glucose-Capillary 168 (H) 70 - 99 mg/dL    Comment: Glucose reference range applies only to samples taken after fasting for at least 8 hours.    CT ANGIO GI BLEED  Result Date: 11/30/2021 CLINICAL DATA:  Dark red blood per rectum for 2 weeks. Follow-up renal mass. EXAM: CTA ABDOMEN AND PELVIS WITHOUT AND WITH CONTRAST TECHNIQUE: Multidetector CT imaging of the abdomen and pelvis was performed using the standard protocol during bolus administration of intravenous contrast. Multiplanar reconstructed images and MIPs were obtained and reviewed to evaluate the vascular anatomy. RADIATION DOSE REDUCTION: This exam was performed according to the departmental dose-optimization program which includes automated exposure control, adjustment of the mA and/or kV according to patient size  and/or use  of iterative reconstruction technique. CONTRAST:  145m OMNIPAQUE IOHEXOL 350 MG/ML SOLN COMPARISON:  06/08/2021. FINDINGS: VASCULAR Aorta: Aortic atherosclerosis. Normal caliber aorta without aneurysm, dissection, vasculitis or significant stenosis. Celiac: Patent without evidence of aneurysm, dissection, vasculitis or significant stenosis. SMA: Patent without evidence of aneurysm, dissection, vasculitis or significant stenosis. Renals: Atherosclerotic calcification of the renal ostia bilaterally with moderate stenosis on the right. No significant stenosis on the left. Both renal arteries are patent without evidence of aneurysm, dissection, vasculitis, or fibromuscular dysplasia. IMA: Patent without evidence of aneurysm, dissection, vasculitis or significant stenosis. Inflow: Patent without evidence of aneurysm, dissection, vasculitis or significant stenosis. Proximal Outflow: Bilateral common femoral and visualized portions of the superficial and profunda femoral arteries are patent without evidence of aneurysm, dissection, vasculitis or significant stenosis. Veins: No obvious venous abnormality within the limitations of this arterial phase study. Review of the MIP images confirms the above findings. NON-VASCULAR Lower chest: A 7 mm nodular density is present in the right middle lobe, axial image 15. Mild atelectasis is noted in the medial aspect of the right lower lobe. Hepatobiliary: No focal liver abnormality is seen. No gallstones, gallbladder wall thickening, or biliary dilatation. Pancreas: Unremarkable. No pancreatic ductal dilatation or surrounding inflammatory changes. Spleen: Normal in size without focal abnormality. Adrenals/Urinary Tract: The adrenal glands are within normal limits. The kidneys enhance symmetrically. There is redemonstration of a hypodense lesion in the lower pole the right kidney measuring 2.4 x 3.1 cm. Heterogeneous hypervascular enhancement is noted on arterial phase with  progressive enhancement on corticomedullary phase and limited washout. No renal calculus or hydronephrosis. Bladder is unremarkable. Stomach/Bowel: Stomach is within normal limits. Appendix is not well seen. There is mild rectal wall thickening with fat stranding. There is a focus of contrast enhancement/blush at the rectum on both arterial and venous phases from a distal branch of the IMA. No free air or pneumatosis. Lymphatic: No abdominal or pelvic lymphadenopathy. Reproductive: The uterus and right adnexa are within normal limits. There is a cystic structure in the left adnexa measuring 2.8 cm. Other: No abdominopelvic ascites. Musculoskeletal: Degenerative changes are present in the thoracolumbar spine. No suspicious or acute osseous abnormality. IMPRESSION: VASCULAR 1. Focal contrast blush in the rectum on arterial and venous phases suggesting active hemorrhage from a distal branch of the IMA. 2. Aortic atherosclerosis with no evidence of aneurysm or dissection. NON-VASCULAR 1. Rectal wall thickening with mild surrounding fat stranding, possible infectious or inflammatory proctitis. 2. Heterogeneously enhancing right renal mass measuring 3.1 x 2.4 cm, concerning for renal cell carcinoma. Urology follow-up is recommended. Electronically Signed   By: LBrett FairyM.D.   On: 11/30/2021 05:20    Review of Systems negative except above Blood pressure (!) 219/98, pulse 68, temperature 98.2 F (36.8 C), temperature source Oral, resp. rate 18, height '5\' 5"'$  (1.651 m), weight 93 kg, last menstrual period 10/28/2016, SpO2 100 %. Physical Exam vital signs stable afebrile no acute distress abdomen is soft nontender Labs and CT reviewed  Assessment/Plan: Bright red blood per rectum abnormal CT kidney mass as well Plan: The risk benefits methods of colonoscopy was discussed and I wanted to prep her today and proceed tomorrow unfortunately with her current bout of nausea vomiting as above we will have the rounding  team check her out tomorrow and probably proceed on Tuesday with further work-up and plans pending those findings and please keep her on clear liquids today and tomorrow in anticipation of that procedure which was discussed with the patient  [place] E 11/30/2021, 11:25 AM

## 2021-11-30 NOTE — ED Notes (Signed)
CBG 163 

## 2021-11-30 NOTE — H&P (Signed)
History and Physical    Patient: Hannah Dougherty DOB: 11/11/1970 DOA: 11/29/2021 DOS: the patient was seen and examined on 11/30/2021 PCP: [name], NP  Patient coming from: Home  Chief Complaint:  Chief Complaint  Patient presents with   Rectal Bleeding   HPI: Hannah Dougherty is a 51 y.o. female with medical history significant of DM2, HTN.  Pt also has enlarging renal mass of R kidney on CT scan in March that is worrisome for renal cell carcinoma.  Pt had been following with [facility].  Unfortunately, though no fault of her own, the patients insurance company: [employer] suddenly declared bankruptcy this summer and although she tried to see [facility] as outpt, they no longer took her (now [place] insurance and wasn't able to be seen.  Today pt presents to ED with c/o 2 weeks of rectal bleeding.  She describes the bleeding as a small to medium amount of dark blood.  She sees it in the toilet bowl both on and mixed in with the feces, and on the toilet tissue when she wipes.  She is not having any weakness, lightheadedness, shortness of breath or abdominal pain.    Review of Systems: As mentioned in the history of present illness. All other systems reviewed and are negative. Past Medical History:  Diagnosis Date   Diabetes mellitus without complication ([place])    Hypertension    Past Surgical History:  Procedure Laterality Date   TUBAL LIGATION     VOCAL CORD LATERALIZATION, ENDOSCOPIC APPROACH W/ MLB     Social History:  reports that she has been smoking cigarettes. She has been smoking an average of .5 packs per day. She has never used smokeless tobacco. She reports current alcohol use. She reports current drug use. Drug: Marijuana.  No Known Allergies  Family History  Problem Relation Age of Onset   Hypertension Mother    Diabetes Mother    Kidney disease Mother    Diabetes Father    Hypertension Father    Hypertension Maternal  Grandmother    Diabetes Maternal Grandmother    Breast cancer Maternal Grandmother     Prior to Admission medications   Medication Sig Start Date End Date Taking? Authorizing Provider  amLODipine (NORVASC) 5 MG tablet Take 1 tablet (5 mg total) by mouth daily. 06/14/21  Yes Oswald Hillock, MD  hydrochlorothiazide (HYDRODIURIL) 12.5 MG tablet Take 1 tablet (12.5 mg total) by mouth daily. 06/13/21  Yes Oswald Hillock, MD  lisinopril (ZESTRIL) 20 MG tablet Take 1 tablet (20 mg total) by mouth daily. 06/14/21  Yes Lama, Marge Duncans, MD  SEMGLEE, YFGN, 100 UNIT/ML Pen Inject 20 Units into the skin at bedtime. 06/16/21  Yes [provider]  carvedilol (COREG) 6.25 MG tablet Take 1 tablet (6.25 mg total) by mouth 2 (two) times daily with a meal. Patient not taking: Reported on 11/30/2021 06/13/21   Oswald Hillock, MD  glucose blood (RELION CONFIRM/MICRO TEST) test strip Use as instructed 12/11/18   Asencion Noble, MD  insulin glargine (LANTUS) 100 UNIT/ML Solostar Pen Inject 20 Units into the skin at bedtime. Patient not taking: Reported on 11/30/2021 06/13/21   Oswald Hillock, MD  metFORMIN (GLUCOPHAGE) 1000 MG tablet Take 1 tablet (1,000 mg total) by mouth 2 (two) times daily with a meal. Patient not taking: Reported on 11/30/2021 06/13/21   Oswald Hillock, MD  nicotine (NICODERM CQ) 21 mg/24hr patch Place 1 patch (21 mg total)  onto the skin daily. Patient not taking: Reported on 06/08/2021 01/07/21   [name], NP  pravastatin (PRAVACHOL) 40 MG tablet Take 1 tablet (40 mg total) by mouth daily. Patient not taking: Reported on 11/30/2021 06/13/21   Oswald Hillock, MD  ReliOn Lancet Devices 30G MISC 1 each by Does not apply route 3 (three) times daily. 12/11/18   Asencion Noble, MD    Physical Exam: Vitals:   11/29/21 2151 11/29/21 2153 11/30/21 0121  BP:  (!) 212/106 (!) 231/98  Pulse: 68  60  Resp: '17 17 17  '$ Temp: 99 F (37.2 C)  98 F (36.7 C)  TempSrc: Oral  Oral  SpO2: 98%  98%   Weight: 93 kg    Height: '5\' 5"'$  (1.651 m)     Constitutional: NAD, calm, comfortable Eyes: PERRL, lids and conjunctivae normal ENMT: Mucous membranes are moist. Posterior pharynx clear of any exudate or lesions.Normal dentition.  Neck: normal, supple, no masses, no thyromegaly Respiratory: clear to auscultation bilaterally, no wheezing, no crackles. Normal respiratory effort. No accessory muscle use.  Cardiovascular: Regular rate and rhythm, no murmurs / rubs / gallops. No extremity edema. 2+ pedal pulses. No carotid bruits.  Abdomen: no tenderness, no masses palpated. No hepatosplenomegaly. Bowel sounds positive.  Musculoskeletal: no clubbing / cyanosis. No joint deformity upper and lower extremities. Good ROM, no contractures. Normal muscle tone.  Skin: no rashes, lesions, ulcers. No induration Neurologic: CN 2-12 grossly intact. Sensation intact, DTR normal. Strength 5/5 in all 4.  Psychiatric: Normal judgment and insight. Alert and oriented x 3. Normal mood.   Data Reviewed:       Latest Ref Rng & Units 11/29/2021   10:18 PM 11/27/2021    7:52 PM 06/12/2021    4:17 AM  CBC  WBC 4.0 - 10.5 K/uL 7.1  7.8  5.1   Hemoglobin 12.0 - 15.0 g/dL 11.2  11.2  12.9   Hematocrit 36.0 - 46.0 % 34.8  35.0  38.5   Platelets 150 - 400 K/uL 239  241  222       Latest Ref Rng & Units 11/29/2021   10:18 PM 11/27/2021    7:52 PM 06/13/2021    4:19 AM  CMP  Glucose 70 - 99 mg/dL 281  198  196   BUN 6 - 20 mg/dL '18  19  13   '$ Creatinine 0.44 - 1.00 mg/dL 1.11  1.36  1.07   Sodium 135 - 145 mmol/L 139  136  131   Potassium 3.5 - 5.1 mmol/L 3.8  3.6  4.1   Chloride 98 - 111 mmol/L 105  103  103   CO2 22 - 32 mmol/L '28  25  23   '$ Calcium 8.9 - 10.3 mg/dL 8.9  8.7  8.0   Total Protein 6.5 - 8.1 g/dL 7.0  6.6    Total Bilirubin 0.3 - 1.2 mg/dL 0.5  0.5    Alkaline Phos 38 - 126 U/L 82  77    AST 15 - 41 U/L 14  13    ALT 0 - 44 U/L 12  12     CTA GI bleed study = pending  Assessment and Plan: *  Hematochezia CTA AP GI bleed scan for: eval of bleeding source eval of R renal mass (see below) Clear liquid diet Seems like slow GIB, despite 2 weeks of symptoms: HGB 11, repeat CBC in AM Hemodynamically stable Message sent to GI for consult later today I  did mention that if they were going to do any studies that it might be best, given patients difficulty with insurance through no fault of her own, to just get these done in-hospital during admission.  Right kidney mass Likely renal cancer.  Doesn't sound like this ever got taken out despite growing mass c/w cancer on CT in March 2023. Will see what its doing today on the CTA AP GI bleed study Due to not being able to see urology as outpt despite 6 months now, please consider just consulting them during this hospital stay and getting this taken care of surgically before its too late!  Sounds like she tried to see them, but her insurance company had apparently declared bankruptcy suddenly this summer and now no one takes her insurance.  Insurance coverage problems Through no fault of her own, her insurance company, [employer], seems to have suddenly declared bankruptcy earlier this summer, leaving her without the ability to be seen as outpt anywhere for the moment. Seeing if we cant get the [facility] taken care of as inpatient this admit Message sent to GI informing them of circumstances and asking that if they have any studies (ie colonoscopy) planned, that they consider just doing this inpatient Getting SW consult to see if she has any options regarding changing insurance providers mid year, medicaid eligibility, etc.  Hypertension PRN IV hydralazine Resume / restarting: Amlodipine 5 PO QD Coreg 6.25 mg PO BID Likely will require additional BP meds as well.  Diabetes ([place]) Uncontrolled DM Sensitive SSI q4h for the moment.      Advance Care Planning:   Code Status: Full Code  Consults: Message sent to Dr. Alessandra Bevels for GI  consult. Please consult urology in AM as well  Family Communication: Family at bedside  Severity of Illness: The appropriate patient status for this patient is OBSERVATION. Observation status is judged to be reasonable and necessary in order to provide the required intensity of service to ensure the patient's safety. The patient's presenting symptoms, physical exam findings, and initial radiographic and laboratory data in the context of their medical condition is felt to place them at decreased risk for further clinical deterioration. Furthermore, it is anticipated that the patient will be medically stable for discharge from the hospital within 2 midnights of admission.   Author: Etta Quill., DO 11/30/2021 4:21 AM  For on call review [email or web address].

## 2021-11-30 NOTE — ED Notes (Signed)
Physician in room

## 2021-11-30 NOTE — Hospital Course (Signed)
Mr. Cocke is a 51 yo female with PMH right renal mass, DMII, HTN who presents with hematochezia. Renal mass has been present dating back to December 2021 CT scans.  Unclear work-up at that time (or if she was able to follow up) but recently she has had difficulty following up with urology given insurance issues ([employer] declaring bankruptcy).  She underwent CTA on admission to evaluate for GI bleeding.  There was some enhancement involving the rectum concerning for possible bleeding, however after evaluation by IR, this was not considered grossly positive for extravasation.  GI was also consulted in setting of this.  Tentative plan is for colonoscopy during hospitalization once she is more stable.

## 2021-11-30 NOTE — Assessment & Plan Note (Addendum)
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

## 2021-11-30 NOTE — Progress Notes (Signed)
Progress Note    Hannah Dougherty   NOI:[id number]  DOB: 08-Jul-1970  DOA: 11/29/2021     0 PCP: [name], NP  Initial CC: hematochezia   Hospital Course: Mr. Letson is a 51 yo female with PMH right renal mass, DMII, HTN who presents with hematochezia. Renal mass has been present dating back to December 2021 CT scans.  Unclear work-up at that time (or if she was able to follow up) but recently she has had difficulty following up with urology given insurance issues ([employer] declaring bankruptcy).  She underwent CTA on admission to evaluate for GI bleeding.  There was some enhancement involving the rectum concerning for possible bleeding, however after evaluation by IR, this was not considered grossly positive for extravasation.  GI was also consulted in setting of this.  Tentative plan is for colonoscopy during hospitalization once she is more stable.  Interval History:  Seen in the ER this morning with fianc present bedside as well.  She is still having dark red stools as of this morning.  Denies any abdominal pain. She did however start to develop some worsening nausea and vomiting later this afternoon along with uncontrolled excessively elevated blood pressure. This morning due to insurance issues, she did mention difficulty with affording medications as well at home.  Assessment and Plan: * Hematochezia - Bleeding scan considered negative after review by IR; GI was recommended to be consulted -Follow-up further GI recommendations.  Tentative plan is for colonoscopy during hospitalization once patient able to tolerate prep; currently has had nausea and vomiting today preventing adequate prep.  Colonoscopy might occur on Tuesday or after -Baseline hemoglobin around 12 g/dL.  -Some downtrend in hemoglobin this morning, down to 10.8 g/dL - Continue trending  Hypertensive urgency - does not appear to have uncontrolled pain - possible noncompliance vs stress/anxiety vs illicit  drug use - check UDS - no major response to PO meds today; with underlying GIB and renal mass, will be more aggressive with lowering - transfer to SDU; start cardene drip and titrate; goal SBP ~180 for now; tomorrow will further lower - once able to tolerate pills adequately, will adjust   Right kidney mass - present dating back to CT A/P on 02/16/20; truly unclear how much she's pursued treating this; presumption certainly is RCC - mass measured 2.4 x 1.7 x 2.6 in 02/2020 and now measures 2.4 x 3.1 cm on current CTA from 11/30/21 - case discussed with Dr. Abner Greenspan on admission by admitting provider; patient trying to being arranged in the outpatient clinic  Hypertension - Home meds include amlodipine, Coreg, HCTZ, lisinopril.  These doses are not maxed out and may be able to consolidate prior to discharge - However, in setting of current nausea and vomiting with inability to tolerate orals consistently, blood pressure is excessively elevated (see hypertensive urgency)  Insurance coverage problems - Per patient, appears that her insurance company, [employer] has gone bankrupt.  She has been unable to follow-up with outpatient providers or afford medications -Discussed with patient to talk with social work about Medicaid eligibility and other options  Diabetes ([place]) - Last A1c 13.2% on 06/08/2021 - Repeat A1c - Continue SSI and CBG monitoring   Old records reviewed in assessment of this patient  Antimicrobials:   DVT prophylaxis:  SCDs Start: 11/30/21 0333   Code Status:   Code Status: Full Code  Mobility Assessment (last 72 hours)     Mobility Assessment   No documentation.  Barriers to discharge:  Disposition Plan: Home in 2 to 3 days Status is: Inpatient  Objective: Blood pressure (!) 201/80, pulse (!) 57, temperature (!) 97.5 F (36.4 C), temperature source Oral, resp. rate 16, height '5\' 5"'$  (1.651 m), weight 93 kg, last menstrual period 10/28/2016, SpO2 100  %.  Examination:  Physical Exam Constitutional:      General: She is not in acute distress.    Appearance: Normal appearance.  HENT:     Head: Normocephalic and atraumatic.     Mouth/Throat:     Mouth: Mucous membranes are moist.  Eyes:     Extraocular Movements: Extraocular movements intact.  Cardiovascular:     Rate and Rhythm: Normal rate and regular rhythm.  Pulmonary:     Effort: Pulmonary effort is normal.     Breath sounds: Normal breath sounds.  Abdominal:     General: Bowel sounds are normal. There is no distension.     Palpations: Abdomen is soft.     Tenderness: There is no abdominal tenderness.  Musculoskeletal:        General: Normal range of motion.     Cervical back: Normal range of motion and neck supple.  Skin:    General: Skin is warm and dry.  Neurological:     General: No focal deficit present.     Mental Status: She is alert.  Psychiatric:        Mood and Affect: Mood normal.        Behavior: Behavior normal.      Consultants:  GI  Procedures:    Data Reviewed: Results for orders placed or performed during the hospital encounter of 11/29/21 (from the past 24 hour(s))  Type and screen [place]     Status: None   Collection Time: 11/29/21  9:15 PM  Result Value Ref Range   ABO/RH(D) O POS    Antibody Screen NEG    Sample Expiration      12/02/2021,2359 Performed at [facility], [place] [address], [place], [place] [address]   Comprehensive metabolic panel     Status: Abnormal   Collection Time: 11/29/21 10:18 PM  Result Value Ref Range   Sodium 139 135 - 145 mmol/L   Potassium 3.8 3.5 - 5.1 mmol/L   Chloride 105 98 - 111 mmol/L   CO2 28 22 - 32 mmol/L   Glucose, Bld 281 (H) 70 - 99 mg/dL   BUN 18 6 - 20 mg/dL   Creatinine, Ser 1.11 (H) 0.44 - 1.00 mg/dL   Calcium 8.9 8.9 - 10.3 mg/dL   Total Protein 7.0 6.5 - 8.1 g/dL   Albumin 3.5 3.5 - 5.0 g/dL   AST 14 (L) 15 - 41 U/L   ALT 12 0 - 44 U/L    Alkaline Phosphatase 82 38 - 126 U/L   Total Bilirubin 0.5 0.3 - 1.2 mg/dL   GFR, Estimated 59 (L) >60 mL/min   Anion gap 6 5 - 15  CBC     Status: Abnormal   Collection Time: 11/29/21 10:18 PM  Result Value Ref Range   WBC 7.1 4.0 - 10.5 K/uL   RBC 4.01 3.87 - 5.11 MIL/uL   Hemoglobin 11.2 (L) 12.0 - 15.0 g/dL   HCT 34.8 (L) 36.0 - 46.0 %   MCV 86.8 80.0 - 100.0 fL   MCH 27.9 26.0 - 34.0 pg   MCHC 32.2 30.0 - 36.0 g/dL   RDW 14.5 11.5 - 15.5 %  Platelets 239 150 - 400 K/uL   nRBC 0.0 0.0 - 0.2 %  CBG monitoring, ED     Status: Abnormal   Collection Time: 11/30/21  4:27 AM  Result Value Ref Range   Glucose-Capillary 295 (H) 70 - 99 mg/dL  ABO/Rh     Status: None   Collection Time: 11/30/21  4:50 AM  Result Value Ref Range   ABO/RH(D)      O POS Performed at [facility] [place] [address], [place], [place] [address]   CBC     Status: Abnormal   Collection Time: 11/30/21  4:50 AM  Result Value Ref Range   WBC 7.3 4.0 - 10.5 K/uL   RBC 3.86 (L) 3.87 - 5.11 MIL/uL   Hemoglobin 10.8 (L) 12.0 - 15.0 g/dL   HCT 33.0 (L) 36.0 - 46.0 %   MCV 85.5 80.0 - 100.0 fL   MCH 28.0 26.0 - 34.0 pg   MCHC 32.7 30.0 - 36.0 g/dL   RDW 14.4 11.5 - 15.5 %   Platelets 212 150 - 400 K/uL   nRBC 0.0 0.0 - 0.2 %  Basic metabolic panel     Status: Abnormal   Collection Time: 11/30/21  4:50 AM  Result Value Ref Range   Sodium 134 (L) 135 - 145 mmol/L   Potassium 4.0 3.5 - 5.1 mmol/L   Chloride 103 98 - 111 mmol/L   CO2 26 22 - 32 mmol/L   Glucose, Bld 277 (H) 70 - 99 mg/dL   BUN 18 6 - 20 mg/dL   Creatinine, Ser 0.99 0.44 - 1.00 mg/dL   Calcium 8.4 (L) 8.9 - 10.3 mg/dL   GFR, Estimated >60 >60 mL/min   Anion gap 5 5 - 15  CBG monitoring, ED     Status: Abnormal   Collection Time: 11/30/21  9:22 AM  Result Value Ref Range   Glucose-Capillary 168 (H) 70 - 99 mg/dL  Glucose, capillary     Status: Abnormal   Collection Time: 11/30/21 12:09 PM  Result Value Ref Range    Glucose-Capillary 220 (H) 70 - 99 mg/dL    I have Reviewed nursing notes, Vitals, and Lab results since pt's last encounter. Pertinent lab results : see above I have ordered test including BMP, CBC, Mg I have reviewed the last note from staff over past 24 hours I have discussed pt's care plan and test results with nursing staff, case manager   LOS: 0 days   Dwyane Dee, MD [facility] 11/30/2021, 2:18 PM

## 2021-11-30 NOTE — Assessment & Plan Note (Addendum)
-   Bleeding scan considered negative after review by IR; GI was recommended to be consulted -Follow-up further GI recommendations.  Tentative plan is for colonoscopy on Tuesday -Baseline hemoglobin around 12 g/dL.  -Some downtrend in hemoglobin on admission but has improved this morning - repeat CBC in am

## 2021-11-30 NOTE — Assessment & Plan Note (Addendum)
-   Home meds include amlodipine, Coreg, HCTZ, lisinopril.  These doses are not maxed out and may be able to consolidate prior to discharge -See hypertensive urgency as well - EKG repeated this admission.  No significant signs of LVH however does have murmur on exam -Echo obtained: EF 60 to 65%, no RWMA, mild LVH, normal diastolic parameters.  Moderate aortic valve regurgitation.  No AS

## 2021-11-30 NOTE — Consult Note (Signed)
Reason for Consult: Bright red blood per rectum abnormal CT Referring Physician: Hospital team  Hannah Dougherty is an 51 y.o. female.  HPI: Patient seen and examined in her hospital computer chart reviewed and unfortunately her heart pill taking on an empty stomach just gave her some nausea vomiting and we discussed proceeding with a colon prep but she just does not feel up to it today and she has had bright red blood for a week or 2 has no problems with her bowels and her family history is negative from a GI standpoint and we also discussed her kidney finding and she has no other complaints  Past Medical History:  Diagnosis Date   Diabetes mellitus without complication ([place])    Hypertension     Past Surgical History:  Procedure Laterality Date   TUBAL LIGATION     VOCAL CORD LATERALIZATION, ENDOSCOPIC APPROACH W/ MLB      Family History  Problem Relation Age of Onset   Hypertension Mother    Diabetes Mother    Kidney disease Mother    Diabetes Father    Hypertension Father    Hypertension Maternal Grandmother    Diabetes Maternal Grandmother    Breast cancer Maternal Grandmother     Social History:  reports that she has been smoking cigarettes. She has been smoking an average of .5 packs per day. She has never used smokeless tobacco. She reports current alcohol use. She reports current drug use. Drug: Marijuana.  Allergies: No Known Allergies  Medications: I have reviewed the patient's current medications.  Results for orders placed or performed during the hospital encounter of 11/29/21 (from the past 48 hour(s))  Type and screen [place]     Status: None   Collection Time: 11/29/21  9:15 PM  Result Value Ref Range   ABO/RH(D) O POS    Antibody Screen NEG    Sample Expiration      12/02/2021,2359 Performed at [facility], [place] [address] [place], [place] [address]   Comprehensive metabolic panel     Status: Abnormal   Collection  Time: 11/29/21 10:18 PM  Result Value Ref Range   Sodium 139 135 - 145 mmol/L   Potassium 3.8 3.5 - 5.1 mmol/L   Chloride 105 98 - 111 mmol/L   CO2 28 22 - 32 mmol/L   Glucose, Bld 281 (H) 70 - 99 mg/dL    Comment: Glucose reference range applies only to samples taken after fasting for at least 8 hours.   BUN 18 6 - 20 mg/dL   Creatinine, Ser 1.11 (H) 0.44 - 1.00 mg/dL   Calcium 8.9 8.9 - 10.3 mg/dL   Total Protein 7.0 6.5 - 8.1 g/dL   Albumin 3.5 3.5 - 5.0 g/dL   AST 14 (L) 15 - 41 U/L   ALT 12 0 - 44 U/L   Alkaline Phosphatase 82 38 - 126 U/L   Total Bilirubin 0.5 0.3 - 1.2 mg/dL   GFR, Estimated 59 (L) >60 mL/min    Comment: (NOTE) Calculated using the CKD-EPI Creatinine Equation (2021)    Anion gap 6 5 - 15    Comment: Performed at [facility], [place] [address], [place], [place] [address]  CBC     Status: Abnormal   Collection Time: 11/29/21 10:18 PM  Result Value Ref Range   WBC 7.1 4.0 - 10.5 K/uL   RBC 4.01 3.87 - 5.11 MIL/uL   Hemoglobin 11.2 (L) 12.0 - 15.0  g/dL   HCT 34.8 (L) 36.0 - 46.0 %   MCV 86.8 80.0 - 100.0 fL   MCH 27.9 26.0 - 34.0 pg   MCHC 32.2 30.0 - 36.0 g/dL   RDW 14.5 11.5 - 15.5 %   Platelets 239 150 - 400 K/uL   nRBC 0.0 0.0 - 0.2 %    Comment: Performed at [facility], [place] [address], [place], [place] [address]  CBG monitoring, ED     Status: Abnormal   Collection Time: 11/30/21  4:27 AM  Result Value Ref Range   Glucose-Capillary 295 (H) 70 - 99 mg/dL    Comment: Glucose reference range applies only to samples taken after fasting for at least 8 hours.  ABO/Rh     Status: None   Collection Time: 11/30/21  4:50 AM  Result Value Ref Range   ABO/RH(D)      O POS Performed at [facility], [place] [address] [place], [place] [address]   CBC     Status: Abnormal   Collection Time: 11/30/21  4:50 AM  Result Value Ref Range   WBC 7.3 4.0 - 10.5 K/uL   RBC 3.86 (L) 3.87 - 5.11 MIL/uL    Hemoglobin 10.8 (L) 12.0 - 15.0 g/dL   HCT 33.0 (L) 36.0 - 46.0 %   MCV 85.5 80.0 - 100.0 fL   MCH 28.0 26.0 - 34.0 pg   MCHC 32.7 30.0 - 36.0 g/dL   RDW 14.4 11.5 - 15.5 %   Platelets 212 150 - 400 K/uL   nRBC 0.0 0.0 - 0.2 %    Comment: Performed at [facility], [place] [address], [place], [place] [address]  Basic metabolic panel     Status: Abnormal   Collection Time: 11/30/21  4:50 AM  Result Value Ref Range   Sodium 134 (L) 135 - 145 mmol/L   Potassium 4.0 3.5 - 5.1 mmol/L   Chloride 103 98 - 111 mmol/L   CO2 26 22 - 32 mmol/L   Glucose, Bld 277 (H) 70 - 99 mg/dL    Comment: Glucose reference range applies only to samples taken after fasting for at least 8 hours.   BUN 18 6 - 20 mg/dL   Creatinine, Ser 0.99 0.44 - 1.00 mg/dL   Calcium 8.4 (L) 8.9 - 10.3 mg/dL   GFR, Estimated >60 >60 mL/min    Comment: (NOTE) Calculated using the CKD-EPI Creatinine Equation (2021)    Anion gap 5 5 - 15    Comment: Performed at [facility] [place] [address] [place], [place] [address]  CBG monitoring, ED     Status: Abnormal   Collection Time: 11/30/21  9:22 AM  Result Value Ref Range   Glucose-Capillary 168 (H) 70 - 99 mg/dL    Comment: Glucose reference range applies only to samples taken after fasting for at least 8 hours.    CT ANGIO GI BLEED  Result Date: 11/30/2021 CLINICAL DATA:  Dark red blood per rectum for 2 weeks. Follow-up renal mass. EXAM: CTA ABDOMEN AND PELVIS WITHOUT AND WITH CONTRAST TECHNIQUE: Multidetector CT imaging of the abdomen and pelvis was performed using the standard protocol during bolus administration of intravenous contrast. Multiplanar reconstructed images and MIPs were obtained and reviewed to evaluate the vascular anatomy. RADIATION DOSE REDUCTION: This exam was performed according to the departmental dose-optimization program which includes automated exposure control, adjustment of the mA and/or kV according to patient size  and/or use  of iterative reconstruction technique. CONTRAST:  14m OMNIPAQUE IOHEXOL 350 MG/ML SOLN COMPARISON:  06/08/2021. FINDINGS: VASCULAR Aorta: Aortic atherosclerosis. Normal caliber aorta without aneurysm, dissection, vasculitis or significant stenosis. Celiac: Patent without evidence of aneurysm, dissection, vasculitis or significant stenosis. SMA: Patent without evidence of aneurysm, dissection, vasculitis or significant stenosis. Renals: Atherosclerotic calcification of the renal ostia bilaterally with moderate stenosis on the right. No significant stenosis on the left. Both renal arteries are patent without evidence of aneurysm, dissection, vasculitis, or fibromuscular dysplasia. IMA: Patent without evidence of aneurysm, dissection, vasculitis or significant stenosis. Inflow: Patent without evidence of aneurysm, dissection, vasculitis or significant stenosis. Proximal Outflow: Bilateral common femoral and visualized portions of the superficial and profunda femoral arteries are patent without evidence of aneurysm, dissection, vasculitis or significant stenosis. Veins: No obvious venous abnormality within the limitations of this arterial phase study. Review of the MIP images confirms the above findings. NON-VASCULAR Lower chest: A 7 mm nodular density is present in the right middle lobe, axial image 15. Mild atelectasis is noted in the medial aspect of the right lower lobe. Hepatobiliary: No focal liver abnormality is seen. No gallstones, gallbladder wall thickening, or biliary dilatation. Pancreas: Unremarkable. No pancreatic ductal dilatation or surrounding inflammatory changes. Spleen: Normal in size without focal abnormality. Adrenals/Urinary Tract: The adrenal glands are within normal limits. The kidneys enhance symmetrically. There is redemonstration of a hypodense lesion in the lower pole the right kidney measuring 2.4 x 3.1 cm. Heterogeneous hypervascular enhancement is noted on arterial phase with  progressive enhancement on corticomedullary phase and limited washout. No renal calculus or hydronephrosis. Bladder is unremarkable. Stomach/Bowel: Stomach is within normal limits. Appendix is not well seen. There is mild rectal wall thickening with fat stranding. There is a focus of contrast enhancement/blush at the rectum on both arterial and venous phases from a distal branch of the IMA. No free air or pneumatosis. Lymphatic: No abdominal or pelvic lymphadenopathy. Reproductive: The uterus and right adnexa are within normal limits. There is a cystic structure in the left adnexa measuring 2.8 cm. Other: No abdominopelvic ascites. Musculoskeletal: Degenerative changes are present in the thoracolumbar spine. No suspicious or acute osseous abnormality. IMPRESSION: VASCULAR 1. Focal contrast blush in the rectum on arterial and venous phases suggesting active hemorrhage from a distal branch of the IMA. 2. Aortic atherosclerosis with no evidence of aneurysm or dissection. NON-VASCULAR 1. Rectal wall thickening with mild surrounding fat stranding, possible infectious or inflammatory proctitis. 2. Heterogeneously enhancing right renal mass measuring 3.1 x 2.4 cm, concerning for renal cell carcinoma. Urology follow-up is recommended. Electronically Signed   By: LBrett FairyM.D.   On: 11/30/2021 05:20    Review of Systems negative except above Blood pressure (!) 219/98, pulse 68, temperature 98.2 F (36.8 C), temperature source Oral, resp. rate 18, height '5\' 5"'$  (1.651 m), weight 93 kg, last menstrual period 10/28/2016, SpO2 100 %. Physical Exam vital signs stable afebrile no acute distress abdomen is soft nontender Labs and CT reviewed  Assessment/Plan: Bright red blood per rectum abnormal CT kidney mass as well Plan: The risk benefits methods of colonoscopy was discussed and I wanted to prep her today and proceed tomorrow unfortunately with her current bout of nausea vomiting as above we will have the rounding  team check her out tomorrow and probably proceed on Tuesday with further work-up and plans pending those findings and please keep her on clear liquids today and tomorrow in anticipation of that procedure which was discussed with the patient  [place] E 11/30/2021, 11:25 AM

## 2021-11-30 NOTE — ED Notes (Signed)
Getting cbg for insulin.  Cbg machine not working.

## 2021-12-01 ENCOUNTER — Inpatient Hospital Stay (HOSPITAL_COMMUNITY): Payer: Self-pay

## 2021-12-01 DIAGNOSIS — R011 Cardiac murmur, unspecified: Secondary | ICD-10-CM

## 2021-12-01 DIAGNOSIS — I160 Hypertensive urgency: Secondary | ICD-10-CM

## 2021-12-01 DIAGNOSIS — I701 Atherosclerosis of renal artery: Secondary | ICD-10-CM

## 2021-12-01 LAB — RAPID URINE DRUG SCREEN, HOSP PERFORMED
Amphetamines: NOT DETECTED
Barbiturates: NOT DETECTED
Benzodiazepines: NOT DETECTED
Cocaine: NOT DETECTED
Opiates: NOT DETECTED
Tetrahydrocannabinol: POSITIVE — AB

## 2021-12-01 LAB — GLUCOSE, CAPILLARY
Glucose-Capillary: 185 mg/dL — ABNORMAL HIGH (ref 70–99)
Glucose-Capillary: 187 mg/dL — ABNORMAL HIGH (ref 70–99)
Glucose-Capillary: 190 mg/dL — ABNORMAL HIGH (ref 70–99)
Glucose-Capillary: 212 mg/dL — ABNORMAL HIGH (ref 70–99)
Glucose-Capillary: 235 mg/dL — ABNORMAL HIGH (ref 70–99)
Glucose-Capillary: 269 mg/dL — ABNORMAL HIGH (ref 70–99)
Glucose-Capillary: 331 mg/dL — ABNORMAL HIGH (ref 70–99)

## 2021-12-01 LAB — CBC WITH DIFFERENTIAL/PLATELET
Abs Immature Granulocytes: 0.05 10*3/uL (ref 0.00–0.07)
Basophils Absolute: 0 10*3/uL (ref 0.0–0.1)
Basophils Relative: 0 %
Eosinophils Absolute: 0 10*3/uL (ref 0.0–0.5)
Eosinophils Relative: 0 %
HCT: 37.5 % (ref 36.0–46.0)
Hemoglobin: 12.2 g/dL (ref 12.0–15.0)
Immature Granulocytes: 0 %
Lymphocytes Relative: 11 %
Lymphs Abs: 1.3 10*3/uL (ref 0.7–4.0)
MCH: 27.2 pg (ref 26.0–34.0)
MCHC: 32.5 g/dL (ref 30.0–36.0)
MCV: 83.7 fL (ref 80.0–100.0)
Monocytes Absolute: 0.3 10*3/uL (ref 0.1–1.0)
Monocytes Relative: 3 %
Neutro Abs: 10.2 10*3/uL — ABNORMAL HIGH (ref 1.7–7.7)
Neutrophils Relative %: 86 %
Platelets: 258 10*3/uL (ref 150–400)
RBC: 4.48 MIL/uL (ref 3.87–5.11)
RDW: 14.5 % (ref 11.5–15.5)
WBC: 11.8 10*3/uL — ABNORMAL HIGH (ref 4.0–10.5)
nRBC: 0 % (ref 0.0–0.2)

## 2021-12-01 LAB — HEMOGLOBIN A1C
Hgb A1c MFr Bld: 11.1 % — ABNORMAL HIGH (ref 4.8–5.6)
Mean Plasma Glucose: 271.87 mg/dL

## 2021-12-01 LAB — BASIC METABOLIC PANEL
Anion gap: 10 (ref 5–15)
BUN: 20 mg/dL (ref 6–20)
CO2: 23 mmol/L (ref 22–32)
Calcium: 8.8 mg/dL — ABNORMAL LOW (ref 8.9–10.3)
Chloride: 103 mmol/L (ref 98–111)
Creatinine, Ser: 0.98 mg/dL (ref 0.44–1.00)
GFR, Estimated: >60 mL/min (ref >60)
Glucose, Bld: 323 mg/dL — ABNORMAL HIGH (ref 70–99)
Potassium: 3.7 mmol/L (ref 3.5–5.1)
Sodium: 136 mmol/L (ref 135–145)

## 2021-12-01 LAB — ECHOCARDIOGRAM COMPLETE
AR max vel: 1.69 cm2
AV Peak grad: 34.9 mmHg
Ao pk vel: 2.96 m/s
Area-P 1/2: 3.97 cm2
Height: 65 in
P 1/2 time: 387 msec
S' Lateral: 2.7 cm
Weight: 3280 oz

## 2021-12-01 LAB — MAGNESIUM: Magnesium: 1.9 mg/dL (ref 1.7–2.4)

## 2021-12-01 MED ORDER — INSULIN ASPART 100 UNIT/ML IJ SOLN
0.0000 [IU] | Freq: Three times a day (TID) | INTRAMUSCULAR | Status: DC
  Administered 2021-12-01 (×2): 3 [IU] via SUBCUTANEOUS
  Administered 2021-12-01: 5 [IU] via SUBCUTANEOUS

## 2021-12-01 MED ORDER — PEG-KCL-NACL-NASULF-NA ASC-C 100 G PO SOLR: 1.0000 | Freq: Once | ORAL | Status: DC

## 2021-12-01 MED ORDER — INSULIN ASPART 100 UNIT/ML IJ SOLN
0.0000 [IU] | INTRAMUSCULAR | Status: DC
  Administered 2021-12-02: 5 [IU] via SUBCUTANEOUS
  Administered 2021-12-02 (×3): 3 [IU] via SUBCUTANEOUS
  Administered 2021-12-02: 5 [IU] via SUBCUTANEOUS
  Administered 2021-12-02: 2 [IU] via SUBCUTANEOUS
  Administered 2021-12-03 (×4): 3 [IU] via SUBCUTANEOUS
  Administered 2021-12-03 (×2): 2 [IU] via SUBCUTANEOUS
  Administered 2021-12-04: 3 [IU] via SUBCUTANEOUS
  Administered 2021-12-04: 5 [IU] via SUBCUTANEOUS
  Administered 2021-12-05 (×2): 2 [IU] via SUBCUTANEOUS

## 2021-12-01 MED ORDER — PEG 3350-KCL-NA BICARB-NACL 420 G PO SOLR
2000.0000 mL | Freq: Once | ORAL | Status: DC
  Filled 2021-12-01: qty 4000

## 2021-12-01 MED ORDER — PEG 3350-KCL-NA BICARB-NACL 420 G PO SOLR: 4000.0000 mL | Freq: Once | ORAL | Status: DC

## 2021-12-01 MED ORDER — INSULIN GLARGINE-YFGN 100 UNIT/ML ~~LOC~~ SOLN
25.0000 [IU] | Freq: Every day | SUBCUTANEOUS | Status: DC
  Administered 2021-12-01 – 2021-12-05 (×5): 25 [IU] via SUBCUTANEOUS
  Filled 2021-12-01 (×5): qty 0.25

## 2021-12-01 MED ORDER — INSULIN ASPART 100 UNIT/ML IJ SOLN: 0.0000 [IU] | Freq: Every day | INTRAMUSCULAR | Status: DC

## 2021-12-01 MED ORDER — LISINOPRIL 10 MG PO TABS
40.0000 mg | ORAL_TABLET | Freq: Every day | ORAL | Status: DC
  Administered 2021-12-01 – 2021-12-05 (×5): 40 mg via ORAL
  Filled 2021-12-01 (×5): qty 4

## 2021-12-01 MED ORDER — PEG-KCL-NACL-NASULF-NA ASC-C 100 G PO SOLR
0.5000 | Freq: Once | ORAL | Status: AC
  Administered 2021-12-01: 100 g via ORAL
  Filled 2021-12-01: qty 1

## 2021-12-01 MED ORDER — PEG 3350-KCL-NA BICARB-NACL 420 G PO SOLR
4000.0000 mL | Freq: Once | ORAL | Status: DC
  Filled 2021-12-01: qty 4000

## 2021-12-01 MED ORDER — INSULIN ASPART 100 UNIT/ML IJ SOLN: 0.0000 [IU] | INTRAMUSCULAR | Status: DC

## 2021-12-01 MED ORDER — PEG-KCL-NACL-NASULF-NA ASC-C 100 G PO SOLR
0.5000 | Freq: Once | ORAL | Status: AC
  Administered 2021-12-02: 100 g via ORAL

## 2021-12-01 NOTE — Progress Notes (Signed)
I was called for input on her right renal artery stenosis that was on the upper end of 1 to 59% range.  Discussed I will see her in the outpatient setting.  She has a somewhat complicated hospital course in the sense that she has been admitted for GI bleed with hematochezia.  This needs to be worked up first prior to even considering a renal artery arteriogram.  Furthermore she has evidence of a right renal mass concerning for renal cell carcinoma.  Will need formal input on that before even considering intervention pending what intervention this will require.  I will arrange follow-up in the office.  Marty Heck, MD [facility] Office: [place]

## 2021-12-01 NOTE — TOC Progression Note (Addendum)
Transition of Care [facility]) - Progression Note    Patient Details  Name: Chancy Claros MRN: [id number] Date of Birth: 30-Jul-1970  [facility] of [facility]) CM/SW Contact  Servando Snare, [place] Phone Number: 12/01/2021, 9:28 AM  Clinical Narrative:    TOC consulted for medicaid and medication assistance. TOC but in referral to financial counseling via secure chat and email. TOC will follow for medication needs at dc.   11:31 AM TOC added links to medicaid application and affordable health care to AVS.        Expected Discharge Plan and Services                                                 Social Determinants of Health (SDOH) Interventions    Readmission Risk Interventions     No data to display

## 2021-12-01 NOTE — Progress Notes (Signed)
Progress Note    Hannah Dougherty   POE:[id number]  DOB: 1970-11-18  DOA: 11/29/2021     1 PCP: [name], NP  Initial CC: hematochezia   Hospital Course: Hannah Dougherty is a 51 yo female with PMH right renal mass, DMII, HTN who presents with hematochezia. Renal mass has been present dating back to December 2021 CT scans.  Unclear work-up at that time (or if she was able to follow up) but recently she has had difficulty following up with urology given insurance issues ([employer] declaring bankruptcy).  She underwent CTA on admission to evaluate for GI bleeding.  There was some enhancement involving the rectum concerning for possible bleeding, however after evaluation by IR, this was not considered grossly positive for extravasation.  GI was also consulted in setting of this.  Tentative plan is for colonoscopy during hospitalization once she is more stable.  Interval History:  No events overnight.  Hungry when seen this morning after renal ultrasound.  She is amenable with attempting bowel prep later today and colonoscopy tomorrow with GI. She mains on Cardene which was further increased some this morning.  P.o. meds have been initiated in efforts to wean Cardene drip some.  Assessment and Plan: * Hematochezia - Bleeding scan considered negative after review by IR; GI was recommended to be consulted -Follow-up further GI recommendations.  Tentative plan is for colonoscopy on Tuesday -Baseline hemoglobin around 12 g/dL.  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; will discuss with vascular surgery as  well - started on cardene on 9/17 due to refractory HTN - adding PO meds as needed to wean drip  Renal artery stenosis ([place]) - Given excessively elevated blood pressure and underlying right renal mass, renal duplex obtained which shows "upper range 1-59% stenosis of the right renal artery at origin-proximal segments." - vascular surgery consult requested for further input  Right kidney mass - present dating back to CT A/P on 02/16/20; truly unclear how much she's pursued treating this; presumption certainly is RCC - mass measured 2.4 x 1.7 x 2.6 in 02/2020 and now measures 2.4 x 3.1 cm on current CTA from 11/30/21 - case discussed with Hannah Dougherty on admission by admitting provider; patient trying to being arranged in the outpatient clinic  Hypertension - Home meds include amlodipine, Coreg, HCTZ, lisinopril.  These doses are not maxed out and may be able to consolidate prior to discharge -See hypertensive urgency as well - EKG repeated this admission.  No significant signs of LVH however does have murmur on exam - Obtain echo  Insurance coverage problems - Per patient, appears that her insurance company, [employer] has gone bankrupt.  She has been unable to follow-up with outpatient providers or afford medications -Discussed with patient to talk with social work about Medicaid eligibility and other options  DMII (diabetes mellitus, type 2) ([place]) - Last A1c 13.2% on 06/08/2021 - A1c this hospitalization is 11.1% - Start on Semglee - Continue SSI and CBG monitoring   Old records reviewed in assessment of this patient  Antimicrobials:   DVT prophylaxis:  SCDs  Start: 11/30/21 0333   Code Status:   Code Status: Full Code  Mobility Assessment (last 72 hours)     Mobility Assessment   No documentation.           Barriers to discharge:  Disposition Plan: Home in 2 to 3 days Status is: Inpatient  Objective: Blood pressure (!) 151/123, pulse 93, temperature 98.2 F (36.8  C), temperature source Oral, resp. rate (!) 21, height '5\' 5"'$  (1.651 m), weight 93 kg, last menstrual period 10/28/2016, SpO2 100 %.  Examination:  Physical Exam Constitutional:      General: She is not in acute distress.    Appearance: Normal appearance.  HENT:     Head: Normocephalic and atraumatic.     Mouth/Throat:     Mouth: Mucous membranes are moist.  Eyes:     Extraocular Movements: Extraocular movements intact.  Cardiovascular:     Rate and Rhythm: Normal rate and regular rhythm.     Heart sounds: Murmur heard.     Systolic murmur is present with a grade of 3/6.     Comments: Murmur appreciated in USBs Pulmonary:     Effort: Pulmonary effort is normal.     Breath sounds: Normal breath sounds.  Abdominal:     General: Bowel sounds are normal. There is no distension.     Palpations: Abdomen is soft.     Tenderness: There is no abdominal tenderness.  Musculoskeletal:        General: Normal range of motion.     Cervical back: Normal range of motion and neck supple.     Right lower leg: No edema.     Left lower leg: No edema.  Skin:    General: Skin is warm and dry.  Neurological:     General: No focal deficit present.     Mental Status: She is alert.  Psychiatric:        Mood and Affect: Mood normal.        Behavior: Behavior normal.      Consultants:  GI  Procedures:    Data Reviewed: Results for orders placed or performed during the hospital encounter of 11/29/21 (from the past 24 hour(s))  MRSA Next Gen by PCR, Nasal     Status: None   Collection Time: 11/30/21  2:57 PM   Specimen: Nasal Mucosa; Nasal Swab  Result Value Ref Range   MRSA by PCR Next Gen NOT DETECTED NOT DETECTED  Glucose, capillary     Status: Abnormal   Collection Time: 11/30/21  3:09 PM  Result Value Ref Range   Glucose-Capillary 282 (H) 70 - 99 mg/dL  Occult blood card to lab, stool RN will collect     Status: Abnormal   Collection Time: 11/30/21  5:57 PM  Result Value Ref Range    Fecal Occult Bld POSITIVE (A) NEGATIVE  Glucose, capillary     Status: Abnormal   Collection Time: 11/30/21  7:43 PM  Result Value Ref Range   Glucose-Capillary 299 (H) 70 - 99 mg/dL   Comment 1 Notify RN    Comment 2 Document in Chart   Glucose, capillary     Status: Abnormal   Collection Time: 11/30/21 11:31 PM  Result Value Ref Range   Glucose-Capillary 331 (H) 70 - 99 mg/dL   Comment 1 Notify RN    Comment 2 Document in Chart   Hemoglobin A1c     Status: Abnormal   Collection Time: 12/01/21  3:03 AM  Result  Value Ref Range   Hgb A1c MFr Bld 11.1 (H) 4.8 - 5.6 %   Mean Plasma Glucose 271.87 mg/dL  Basic metabolic panel     Status: Abnormal   Collection Time: 12/01/21  3:03 AM  Result Value Ref Range   Sodium 136 135 - 145 mmol/L   Potassium 3.7 3.5 - 5.1 mmol/L   Chloride 103 98 - 111 mmol/L   CO2 23 22 - 32 mmol/L   Glucose, Bld 323 (H) 70 - 99 mg/dL   BUN 20 6 - 20 mg/dL   Creatinine, Ser 0.98 0.44 - 1.00 mg/dL   Calcium 8.8 (L) 8.9 - 10.3 mg/dL   GFR, Estimated >60 >60 mL/min   Anion gap 10 5 - 15  CBC with Differential/Platelet     Status: Abnormal   Collection Time: 12/01/21  3:03 AM  Result Value Ref Range   WBC 11.8 (H) 4.0 - 10.5 K/uL   RBC 4.48 3.87 - 5.11 MIL/uL   Hemoglobin 12.2 12.0 - 15.0 g/dL   HCT 37.5 36.0 - 46.0 %   MCV 83.7 80.0 - 100.0 fL   MCH 27.2 26.0 - 34.0 pg   MCHC 32.5 30.0 - 36.0 g/dL   RDW 14.5 11.5 - 15.5 %   Platelets 258 150 - 400 K/uL   nRBC 0.0 0.0 - 0.2 %   Neutrophils Relative % 86 %   Neutro Abs 10.2 (H) 1.7 - 7.7 K/uL   Lymphocytes Relative 11 %   Lymphs Abs 1.3 0.7 - 4.0 K/uL   Monocytes Relative 3 %   Monocytes Absolute 0.3 0.1 - 1.0 K/uL   Eosinophils Relative 0 %   Eosinophils Absolute 0.0 0.0 - 0.5 K/uL   Basophils Relative 0 %   Basophils Absolute 0.0 0.0 - 0.1 K/uL   Immature Granulocytes 0 %   Abs Immature Granulocytes 0.05 0.00 - 0.07 K/uL  Magnesium     Status: None   Collection Time: 12/01/21  3:03 AM  Result  Value Ref Range   Magnesium 1.9 1.7 - 2.4 mg/dL  Glucose, capillary     Status: Abnormal   Collection Time: 12/01/21  3:45 AM  Result Value Ref Range   Glucose-Capillary 269 (H) 70 - 99 mg/dL   Comment 1 Notify RN    Comment 2 Document in Chart   Rapid urine drug screen (hospital performed)     Status: Abnormal   Collection Time: 12/01/21  6:54 AM  Result Value Ref Range   Opiates NONE DETECTED NONE DETECTED   Cocaine NONE DETECTED NONE DETECTED   Benzodiazepines NONE DETECTED NONE DETECTED   Amphetamines NONE DETECTED NONE DETECTED   Tetrahydrocannabinol POSITIVE (A) NONE DETECTED   Barbiturates NONE DETECTED NONE DETECTED  Glucose, capillary     Status: Abnormal   Collection Time: 12/01/21  7:35 AM  Result Value Ref Range   Glucose-Capillary 235 (H) 70 - 99 mg/dL   Comment 1 Notify RN   Glucose, capillary     Status: Abnormal   Collection Time: 12/01/21 11:22 AM  Result Value Ref Range   Glucose-Capillary 187 (H) 70 - 99 mg/dL   Comment 1 Notify RN     I have Reviewed nursing notes, Vitals, and Lab results since pt's last encounter. Pertinent lab results : see above I have ordered test including BMP, CBC, Mg I have reviewed the last note from staff over past 24 hours I have discussed pt's care plan and test results with nursing staff, case manager  LOS: 1 day   Dwyane Dee, MD [facility] 12/01/2021, 1:06 PM

## 2021-12-01 NOTE — Progress Notes (Addendum)
[facility] Gastroenterology Progress Note  Hannah Dougherty 51 y.o. 10/30/1970     Subjective: Patient seen and examined laying in bed.  Patient notes last bowel movement at 2 AM with small amount of bright red blood.  Her nausea has improved.  She is agreeable to starting Nulytely prep today.    ROS : Review of Systems  Gastrointestinal:  Positive for blood in stool. Negative for abdominal pain, constipation, diarrhea, heartburn, melena, nausea and vomiting.  Genitourinary:  Negative for dysuria and urgency.      Objective: Vital signs in last 24 hours: Vitals:   12/01/21 0630 12/01/21 0736  BP: (!) 151/123   Pulse: 93   Resp: (!) 21   Temp:  98.1 F (36.7 C)  SpO2: 100%     Physical Exam:  General:  Alert, cooperative, no distress, appears stated age  Head:  Normocephalic, without obvious abnormality, atraumatic  Eyes:  Anicteric sclera, EOM's intact  Lungs:   Clear to auscultation bilaterally, respirations unlabored  Heart:  Regular rate and rhythm, S1, S2 normal  Abdomen:   Soft, non-tender, bowel sounds active all four quadrants,  no masses,   Extremities: Extremities normal, atraumatic, no  edema  Pulses: 2+ and symmetric    Lab Results: Recent Labs    11/30/21 0450 12/01/21 0303  NA 134* 136  K 4.0 3.7  CL 103 103  CO2 26 23  GLUCOSE 277* 323*  BUN 18 20  CREATININE 0.99 0.98  CALCIUM 8.4* 8.8*  MG  --  1.9   Recent Labs    11/29/21 2218  AST 14*  ALT 12  ALKPHOS 82  BILITOT 0.5  PROT 7.0  ALBUMIN 3.5   Recent Labs    11/30/21 0450 12/01/21 0303  WBC 7.3 11.8*  NEUTROABS  --  10.2*  HGB 10.8* 12.2  HCT 33.0* 37.5  MCV 85.5 83.7  PLT 212 258   No results for input(s): "LABPROT", "INR" in the last 72 hours.    Assessment Hematochezia Kidney mass  HGB 12.2(10.8) Platelets 258 AST 14 ALT 12  Alkphos 82 TBili 0.5  CT angio GI bleed 11/30/2021 1. Focal contrast blush in the rectum on arterial and venous phases suggesting active  hemorrhage from a distal branch of the IMA 1. Rectal wall thickening with mild surrounding fat stranding, possible infectious or inflammatory proctitis. 2. Heterogeneously enhancing right renal mass measuring 3.1 x 2.4 cm, concerning for renal cell carcinoma. Urology follow-up is recommended.  Hemoglobin improved overnight. No elevation in BUN.  Patient continues to have small episodes of bright red blood per rectum.  Nursing staff notes bloody mucus during bowel movements.  Patient has not had a previous colonoscopy.  CT angio suggestive of active hemorrhage from distal branch of the IMA, IR did not consider this grossly positive for extravasation GI was consulted for consideration of colonoscopy.  Kidney mass has been present on CT scan since 2021.   Plan: Plan for Colonoscopy tomorrow. I thoroughly discussed the procedures to include nature, alternatives, benefits, and risks including but not limited to bleeding, perforation, infection, anesthesia/cardiac and pulmonary complications. Patient provides understanding and gave verbal consent to proceed. Nulytely prep, clear liquid diet, NPO at midnight. Continue daily CBC with transfusion as needed to maintain Hgb >7.  [facility] will follow.     Arvella Nigh Anson Peddie PA-C 12/01/2021, 9:29 AM  Contact #  [phone]

## 2021-12-01 NOTE — Assessment & Plan Note (Addendum)
-   Given excessively elevated blood pressure and underlying right renal mass, renal duplex obtained which shows "upper range 1-59% stenosis of the right renal artery at origin-proximal segments." - vascular surgery reviewed chart during hospitalization.  Plan will be for outpatient follow-up for pursuing possible renal artery arteriogram.  She will also need probable follow-up first with urology to discuss renal mass management

## 2021-12-01 NOTE — Discharge Instructions (Signed)
Visit healthcare.gov to shop for plans or go to [email or web address] to apply for [place] med[place].

## 2021-12-01 NOTE — Progress Notes (Signed)
Renal artery duplex completed. Refer to "CV Proc" under chart review to view preliminary results.  12/01/2021 8:58 AM Kelby Aline., MHA, RVT, RDCS, RDMS

## 2021-12-02 ENCOUNTER — Inpatient Hospital Stay (HOSPITAL_COMMUNITY): Payer: Self-pay | Admitting: Certified Registered Nurse Anesthetist

## 2021-12-02 ENCOUNTER — Encounter (HOSPITAL_COMMUNITY): Payer: Self-pay | Admitting: Internal Medicine

## 2021-12-02 ENCOUNTER — Encounter (HOSPITAL_COMMUNITY)
Admission: EM | Disposition: A | Discharge status: Home / Self Care | Payer: Self-pay | Source: Home / Self Care | Attending: Internal Medicine

## 2021-12-02 DIAGNOSIS — I10 Essential (primary) hypertension: Secondary | ICD-10-CM

## 2021-12-02 DIAGNOSIS — K635 Polyp of colon: Secondary | ICD-10-CM

## 2021-12-02 DIAGNOSIS — K5730 Diverticulosis of large intestine without perforation or abscess without bleeding: Secondary | ICD-10-CM

## 2021-12-02 DIAGNOSIS — F17210 Nicotine dependence, cigarettes, uncomplicated: Secondary | ICD-10-CM

## 2021-12-02 HISTORY — PX: HEMOSTASIS CLIP PLACEMENT: SHX6857

## 2021-12-02 HISTORY — PX: COLONOSCOPY: SHX5424

## 2021-12-02 HISTORY — PX: SUBMUCOSAL TATTOO INJECTION: SHX6856

## 2021-12-02 HISTORY — PX: POLYPECTOMY: SHX5525

## 2021-12-02 LAB — CBC WITH DIFFERENTIAL/PLATELET
Abs Immature Granulocytes: 0.05 10*3/uL (ref 0.00–0.07)
Basophils Absolute: 0 10*3/uL (ref 0.0–0.1)
Basophils Relative: 0 %
Eosinophils Absolute: 0 10*3/uL (ref 0.0–0.5)
Eosinophils Relative: 0 %
HCT: 36.7 % (ref 36.0–46.0)
Hemoglobin: 11.8 g/dL — ABNORMAL LOW (ref 12.0–15.0)
Immature Granulocytes: 0 %
Lymphocytes Relative: 15 %
Lymphs Abs: 2.1 10*3/uL (ref 0.7–4.0)
MCH: 27.4 pg (ref 26.0–34.0)
MCHC: 32.2 g/dL (ref 30.0–36.0)
MCV: 85.2 fL (ref 80.0–100.0)
Monocytes Absolute: 0.8 10*3/uL (ref 0.1–1.0)
Monocytes Relative: 6 %
Neutro Abs: 10.5 10*3/uL — ABNORMAL HIGH (ref 1.7–7.7)
Neutrophils Relative %: 79 %
Platelets: 238 10*3/uL (ref 150–400)
RBC: 4.31 MIL/uL (ref 3.87–5.11)
RDW: 14.9 % (ref 11.5–15.5)
WBC: 13.5 10*3/uL — ABNORMAL HIGH (ref 4.0–10.5)
nRBC: 0 % (ref 0.0–0.2)

## 2021-12-02 LAB — MAGNESIUM: Magnesium: 1.9 mg/dL (ref 1.7–2.4)

## 2021-12-02 LAB — GLUCOSE, CAPILLARY
Glucose-Capillary: 139 mg/dL — ABNORMAL HIGH (ref 70–99)
Glucose-Capillary: 144 mg/dL — ABNORMAL HIGH (ref 70–99)
Glucose-Capillary: 155 mg/dL — ABNORMAL HIGH (ref 70–99)
Glucose-Capillary: 164 mg/dL — ABNORMAL HIGH (ref 70–99)
Glucose-Capillary: 168 mg/dL — ABNORMAL HIGH (ref 70–99)
Glucose-Capillary: 177 mg/dL — ABNORMAL HIGH (ref 70–99)
Glucose-Capillary: 222 mg/dL — ABNORMAL HIGH (ref 70–99)

## 2021-12-02 LAB — BASIC METABOLIC PANEL
Anion gap: 8 (ref 5–15)
BUN: 16 mg/dL (ref 6–20)
CO2: 23 mmol/L (ref 22–32)
Calcium: 8.8 mg/dL — ABNORMAL LOW (ref 8.9–10.3)
Chloride: 108 mmol/L (ref 98–111)
Creatinine, Ser: 0.86 mg/dL (ref 0.44–1.00)
GFR, Estimated: >60 mL/min (ref >60)
Glucose, Bld: 155 mg/dL — ABNORMAL HIGH (ref 70–99)
Potassium: 3.4 mmol/L — ABNORMAL LOW (ref 3.5–5.1)
Sodium: 139 mmol/L (ref 135–145)

## 2021-12-02 SURGERY — COLONOSCOPY
Anesthesia: Monitor Anesthesia Care

## 2021-12-02 MED ORDER — PROPOFOL 1000 MG/100ML IV EMUL
INTRAVENOUS | Status: AC
  Filled 2021-12-02: qty 100

## 2021-12-02 MED ORDER — LIDOCAINE 2% (20 MG/ML) 5 ML SYRINGE
INTRAMUSCULAR | Status: DC | PRN
  Administered 2021-12-02: 60 mg via INTRAVENOUS

## 2021-12-02 MED ORDER — SPOT INK MARKER SYRINGE KIT
PACK | SUBMUCOSAL | Status: AC
  Filled 2021-12-02: qty 5

## 2021-12-02 MED ORDER — PHENYLEPHRINE HCL-NACL 20-0.9 MG/250ML-% IV SOLN
INTRAVENOUS | Status: AC
  Filled 2021-12-02: qty 250

## 2021-12-02 MED ORDER — HYDRALAZINE HCL 50 MG PO TABS
75.0000 mg | ORAL_TABLET | Freq: Three times a day (TID) | ORAL | Status: DC
  Administered 2021-12-02 – 2021-12-03 (×3): 75 mg via ORAL
  Filled 2021-12-02 (×3): qty 1

## 2021-12-02 MED ORDER — PROPOFOL 500 MG/50ML IV EMUL
INTRAVENOUS | Status: DC | PRN
  Administered 2021-12-02: 100 ug/kg/min via INTRAVENOUS

## 2021-12-02 MED ORDER — HYDRALAZINE HCL 25 MG PO TABS
25.0000 mg | ORAL_TABLET | Freq: Three times a day (TID) | ORAL | Status: DC
  Administered 2021-12-02: 25 mg via ORAL
  Filled 2021-12-02: qty 1

## 2021-12-02 MED ORDER — POTASSIUM CHLORIDE CRYS ER 20 MEQ PO TBCR
40.0000 meq | EXTENDED_RELEASE_TABLET | Freq: Once | ORAL | Status: AC
  Administered 2021-12-02: 40 meq via ORAL
  Filled 2021-12-02: qty 2

## 2021-12-02 MED ORDER — SPOT INK MARKER SYRINGE KIT
PACK | SUBMUCOSAL | Status: DC | PRN
  Administered 2021-12-02: .5 mL via SUBMUCOSAL

## 2021-12-02 MED ORDER — PROPOFOL 10 MG/ML IV BOLUS
INTRAVENOUS | Status: DC | PRN
  Administered 2021-12-02 (×3): 20 mg via INTRAVENOUS

## 2021-12-02 MED ORDER — LACTATED RINGERS IV SOLN
INTRAVENOUS | Status: DC
  Administered 2021-12-02: 1000 mL via INTRAVENOUS

## 2021-12-02 MED ORDER — NICARDIPINE HCL IN NACL 40-0.83 MG/200ML-% IV SOLN
3.0000 mg/h | INTRAVENOUS | Status: DC
  Administered 2021-12-02: 15 mg/h via INTRAVENOUS
  Administered 2021-12-02 – 2021-12-03 (×7): 10 mg/h via INTRAVENOUS
  Administered 2021-12-04: 7.5 mg/h via INTRAVENOUS
  Filled 2021-12-02 (×10): qty 200

## 2021-12-02 MED ORDER — PROPOFOL 10 MG/ML IV BOLUS
INTRAVENOUS | Status: AC
  Filled 2021-12-02: qty 20

## 2021-12-02 NOTE — Progress Notes (Signed)
Progress Note    Melissia Lahman   VPX:[id number]  DOB: 17-Aug-1970  DOA: 11/29/2021     2 PCP: [name], NP  Initial CC: hematochezia   Hospital Course: Mr. Dietzman is a 51 yo female with PMH right renal mass, DMII, HTN who presents with hematochezia. Renal mass has been present dating back to December 2021 CT scans.  Unclear work-up at that time (or if she was able to follow up) but recently she has had difficulty following up with urology given insurance issues ([employer] declaring bankruptcy).  She underwent CTA on admission to evaluate for GI bleeding.  There was some enhancement involving the rectum concerning for possible bleeding, however after evaluation by IR, this was not considered grossly positive for extravasation.  GI was also consulted in setting of this.  She underwent colonoscopy on 9/19 removing 2 sigmoid polyps (1 large, which may have been source of bleeding).   Interval History:  Seen in room after colonoscopy. Asking for water. BP stable but still on high rate cardene. PO meds further adjusted today.   Assessment and Plan: * Hematochezia - Bleeding scan considered negative after review by IR; GI was recommended to be consulted -Follow-up further GI recommendations.  Patient able to undergo colonoscopy on 12/02/2021, removing 2 sigmoid polyps (one of them being large and considered culprit for probable bleeding source) -Baseline hemoglobin around 12 g/dL.  -Hemoglobin has been stabilizing - Repeat CBC in a.m.  Hypertensive urgency - does not appear to have uncontrolled pain - possible noncompliance vs stress/anxiety vs illicit drug use - does have some B/L RAS noted on CTA but some of her elevation is more probably contributed to noncompliance lately  - UDS positive for THC only  (no cocaine) - renal duplex does show some Right sided RAS; will discuss with vascular surgery as well - started on cardene on 9/17 due to refractory HTN - adding PO meds as  needed to wean drip  Renal artery stenosis ([place]) - Given excessively elevated blood pressure and underlying right renal mass, renal duplex obtained which shows "upper range 1-59% stenosis of the right renal artery at origin-proximal segments." - vascular surgery reviewed chart during hospitalization.  Plan will be for outpatient follow-up for pursuing possible renal artery arteriogram.  She will also need probable follow-up first with urology to discuss renal mass management  Right kidney mass - present dating back to CT A/P on 02/16/20; truly unclear how much she's pursued treating this; presumption certainly is RCC - mass measured 2.4 x 1.7 x 2.6 in 02/2020 and now measures 2.4 x 3.1 cm on current CTA from 11/30/21 - case discussed with Dr. Abner Greenspan on admission by admitting provider; patient trying to being arranged in the outpatient clinic  Hypertension - Home meds include amlodipine, Coreg, HCTZ, lisinopril.  These doses are not maxed out and may be able to consolidate prior to discharge -See hypertensive urgency as well - EKG repeated this admission.  No significant signs of LVH however does have murmur on exam -Echo obtained: EF 60 to 65%, no RWMA, mild LVH, normal diastolic parameters.  Moderate aortic valve regurgitation.  No AS  Insurance coverage problems - Per patient, appears that her insurance company, [employer] has gone bankrupt.  She has been unable to follow-up with outpatient providers or afford medications -Discussed with patient to talk with social work about Medicaid eligibility and other options  DMII (diabetes mellitus, type 2) ([place]) - Last A1c 13.2% on 06/08/2021 - A1c  this hospitalization is 11.1% - Start on Semglee - Continue SSI and CBG monitoring   Old records reviewed in assessment of this patient  Antimicrobials:   DVT prophylaxis:  SCDs Start: 11/30/21 0333   Code Status:   Code Status: Full Code  Mobility Assessment (last 72 hours)     Mobility  Assessment   No documentation.           Barriers to discharge:  Disposition Plan: Home in 2 to 3 days Status is: Inpatient  Objective: Blood pressure (!) 157/60, pulse 92, temperature 97.7 F (36.5 C), temperature source Axillary, resp. rate 17, height '5\' 5"'$  (1.651 m), weight 93 kg, last menstrual period 10/28/2016, SpO2 99 %.  Examination:  Physical Exam Constitutional:      General: She is not in acute distress.    Appearance: Normal appearance.  HENT:     Head: Normocephalic and atraumatic.     Mouth/Throat:     Mouth: Mucous membranes are moist.  Eyes:     Extraocular Movements: Extraocular movements intact.  Cardiovascular:     Rate and Rhythm: Normal rate and regular rhythm.     Heart sounds: Murmur heard.     Systolic murmur is present with a grade of 3/6.     Comments: Murmur appreciated in USBs Pulmonary:     Effort: Pulmonary effort is normal.     Breath sounds: Normal breath sounds.  Abdominal:     General: Bowel sounds are normal. There is no distension.     Palpations: Abdomen is soft.     Tenderness: There is no abdominal tenderness.  Musculoskeletal:        General: Normal range of motion.     Cervical back: Normal range of motion and neck supple.     Right lower leg: No edema.     Left lower leg: No edema.  Skin:    General: Skin is warm and dry.  Neurological:     General: No focal deficit present.     Mental Status: She is alert.  Psychiatric:        Mood and Affect: Mood normal.        Behavior: Behavior normal.      Consultants:  GI  Procedures:  Colonoscopy, 12/02/2021  Data Reviewed: Results for orders placed or performed during the hospital encounter of 11/29/21 (from the past 24 hour(s))  Glucose, capillary     Status: Abnormal   Collection Time: 12/01/21  4:49 PM  Result Value Ref Range   Glucose-Capillary 185 (H) 70 - 99 mg/dL   Comment 1 Notify RN    Comment 2 Document in Chart   Glucose, capillary     Status: Abnormal    Collection Time: 12/01/21  8:02 PM  Result Value Ref Range   Glucose-Capillary 190 (H) 70 - 99 mg/dL  Glucose, capillary     Status: Abnormal   Collection Time: 12/01/21 11:29 PM  Result Value Ref Range   Glucose-Capillary 212 (H) 70 - 99 mg/dL  Glucose, capillary     Status: Abnormal   Collection Time: 12/02/21  3:27 AM  Result Value Ref Range   Glucose-Capillary 168 (H) 70 - 99 mg/dL  Basic metabolic panel     Status: Abnormal   Collection Time: 12/02/21  3:28 AM  Result Value Ref Range   Sodium 139 135 - 145 mmol/L   Potassium 3.4 (L) 3.5 - 5.1 mmol/L   Chloride 108 98 - 111 mmol/L   CO2 23  22 - 32 mmol/L   Glucose, Bld 155 (H) 70 - 99 mg/dL   BUN 16 6 - 20 mg/dL   Creatinine, Ser 0.86 0.44 - 1.00 mg/dL   Calcium 8.8 (L) 8.9 - 10.3 mg/dL   GFR, Estimated >60 >60 mL/min   Anion gap 8 5 - 15  CBC with Differential/Platelet     Status: Abnormal   Collection Time: 12/02/21  3:28 AM  Result Value Ref Range   WBC 13.5 (H) 4.0 - 10.5 K/uL   RBC 4.31 3.87 - 5.11 MIL/uL   Hemoglobin 11.8 (L) 12.0 - 15.0 g/dL   HCT 36.7 36.0 - 46.0 %   MCV 85.2 80.0 - 100.0 fL   MCH 27.4 26.0 - 34.0 pg   MCHC 32.2 30.0 - 36.0 g/dL   RDW 14.9 11.5 - 15.5 %   Platelets 238 150 - 400 K/uL   nRBC 0.0 0.0 - 0.2 %   Neutrophils Relative % 79 %   Neutro Abs 10.5 (H) 1.7 - 7.7 K/uL   Lymphocytes Relative 15 %   Lymphs Abs 2.1 0.7 - 4.0 K/uL   Monocytes Relative 6 %   Monocytes Absolute 0.8 0.1 - 1.0 K/uL   Eosinophils Relative 0 %   Eosinophils Absolute 0.0 0.0 - 0.5 K/uL   Basophils Relative 0 %   Basophils Absolute 0.0 0.0 - 0.1 K/uL   Immature Granulocytes 0 %   Abs Immature Granulocytes 0.05 0.00 - 0.07 K/uL  Magnesium     Status: None   Collection Time: 12/02/21  3:28 AM  Result Value Ref Range   Magnesium 1.9 1.7 - 2.4 mg/dL  Glucose, capillary     Status: Abnormal   Collection Time: 12/02/21  7:20 AM  Result Value Ref Range   Glucose-Capillary 144 (H) 70 - 99 mg/dL   Comment 1 Notify  RN    Comment 2 Document in Chart   Glucose, capillary     Status: Abnormal   Collection Time: 12/02/21  9:15 AM  Result Value Ref Range   Glucose-Capillary 177 (H) 70 - 99 mg/dL  Glucose, capillary     Status: Abnormal   Collection Time: 12/02/21 12:17 PM  Result Value Ref Range   Glucose-Capillary 222 (H) 70 - 99 mg/dL   Comment 1 Notify RN    Comment 2 Document in Chart     I have Reviewed nursing notes, Vitals, and Lab results since pt's last encounter. Pertinent lab results : see above I have ordered test including BMP, CBC, Mg I have reviewed the last note from staff over past 24 hours I have discussed pt's care plan and test results with nursing staff, case manager   LOS: 2 days   Dwyane Dee, MD [facility] 12/02/2021, 1:30 PM

## 2021-12-02 NOTE — Anesthesia Preprocedure Evaluation (Signed)
Anesthesia Evaluation  Patient identified by MRN, date of birth, ID band Patient awake    Reviewed: Allergy & Precautions, NPO status , Patient's Chart, lab work & pertinent test results  Airway Mallampati: II  TM Distance: >3 FB Neck ROM: Full    Dental no notable dental hx.    Pulmonary neg pulmonary ROS, Current Smoker and Patient abstained from smoking.,    Pulmonary exam normal        Cardiovascular hypertension, Pt. on medications and Pt. on home beta blockers + Peripheral Vascular Disease   Rhythm:Regular Rate:Normal     Neuro/Psych negative neurological ROS  negative psych ROS   GI/Hepatic negative GI ROS, Neg liver ROS,   Endo/Other  diabetes, Type 2, Oral Hypoglycemic Agents  Renal/GU   negative genitourinary   Musculoskeletal negative musculoskeletal ROS (+)   Abdominal Normal abdominal exam  (+)   Peds  Hematology negative hematology ROS (+)   Anesthesia Other Findings   Reproductive/Obstetrics                             Anesthesia Physical Anesthesia Plan  ASA: 2  Anesthesia Plan: MAC   Post-op Pain Management:    Induction: Intravenous  PONV Risk Score and Plan: 1 and Propofol infusion and Treatment may vary due to age or medical condition  Airway Management Planned: Simple Face Mask, Natural Airway and Nasal Cannula  Additional Equipment: None  Intra-op Plan:   Post-operative Plan:   Informed Consent: I have reviewed the patients History and Physical, chart, labs and discussed the procedure including the risks, benefits and alternatives for the proposed anesthesia with the patient or authorized representative who has indicated his/her understanding and acceptance.     Dental advisory given  Plan Discussed with:   Anesthesia Plan Comments:         Anesthesia Quick Evaluation

## 2021-12-02 NOTE — Op Note (Signed)
[facility] Patient Name: Hannah Dougherty Procedure Date: 12/02/2021 MRN: [id number] Attending MD: Arta Silence , MD Date of Birth: 1970/12/27 CSN: [id number] Age: 51 Admit Type: Inpatient Procedure:                Colonoscopy Indications:              Hematochezia, Abnormal CT of the GI tract Providers:                Arta Silence, MD, Elmer Ramp. Tilden Dome, RN, Cherylynn Ridges, Technician, Christell Faith, CRNA Referring MD:             [facility]. Medicines:                Monitored Anesthesia Care Complications:            No immediate complications. Estimated Blood Loss:     Estimated blood loss: none. Procedure:                Pre-Anesthesia Assessment:                           - Prior to the procedure, a History and Physical                            was performed, and patient medications and                            allergies were reviewed. The patient's tolerance of                            previous anesthesia was also reviewed. The risks                            and benefits of the procedure and the sedation                            options and risks were discussed with the patient.                            All questions were answered, and informed consent                            was obtained. Prior Anticoagulants: The patient has                            taken no previous anticoagulant or antiplatelet                            agents. ASA Grade Assessment: II - A patient with                            mild systemic disease. After reviewing the risks  and benefits, the patient was deemed in                            satisfactory condition to undergo the procedure.                           After obtaining informed consent, the colonoscope                            was passed under direct vision. Throughout the                            procedure, the patient's blood pressure, pulse, and                             oxygen saturations were monitored continuously. The                            PCF-HQ190L ([id number]) Olympus colonoscope was                            introduced through the anus and advanced to the the                            cecum, identified by appendiceal orifice and                            ileocecal valve. The colonoscopy was performed                            without difficulty. The patient tolerated the                            procedure well. The quality of the bowel                            preparation was good. Scope In: 10:29:17 AM Scope Out: 11:16:58 AM Total Procedure Duration: 0 hours 47 minutes 41 seconds  Findings:      The perianal and digital rectal examinations were normal.      A 10 mm polyp was found in the distal sigmoid colon. The polyp was       semi-pedunculated. The polyp was removed with a hot snare. Resection and       retrieval were complete.      A 45 mm polyp was found in the mid sigmoid colon. The polyp was       pedunculated and friable. Stalk was approximately 1 cm in diameter. To       prevent bleeding post-intervention, two hemostatic clips were       successfully placed over the stalk pre-polypectomy. There was no       bleeding during the maneuver. Area was successfully injected with 1 mL       [place] ink for tattooing. The polyp was removed with a hot snare.       Resection and retrieval were complete.      A few small-mouthed  diverticula were found in the sigmoid colon and       descending colon.      The retroflexed view of the distal rectum and anal verge was normal and       showed no anal or rectal abnormalities.      No other significant abnormalities were identified in a careful       examination of the remainder of the colon. Impression:               - One 10 mm polyp in the distal sigmoid colon,                            removed with a hot snare. Resected and retrieved.                           -  One 45 mm polyp in the mid sigmoid colon, removed                            with a hot snare. Resected and retrieved. Clips                            were placed. Tattooed.                           - Diverticulosis in the sigmoid colon and in the                            descending colon.                           - The examination was otherwise normal.                           - Bleeding highly likely from large sigmoid polyp,                            which was removed with today's colonoscopy. Moderate Sedation:      None Recommendation:           - Return patient to hospital ward for ongoing care.                           - Soft diet today.                           - No aspirin, ibuprofen, naproxen, or other                            non-steroidal anti-inflammatory drugs for 7 days                            after polyp removal.                           - Await pathology results.                           -  Repeat colonoscopy (date not yet determined) for                            surveillance based on pathology results.                           [facility] will follow. Procedure Code(s):        --- Professional ---                           [phone], Colonoscopy, flexible; with removal of                            tumor(s), polyp(s), or other lesion(s) by snare                            technique                           [id number], Colonoscopy, flexible; with directed                            submucosal injection(s), any substance Diagnosis Code(s):        --- Professional ---                           K63.5, Polyp of colon                           K92.1, Melena (includes Hematochezia)                           K57.30, Diverticulosis of large intestine without                            perforation or abscess without bleeding                           R93.3, Abnormal findings on diagnostic imaging of                            other parts of digestive tract CPT  copyright 2019 American Medical Association. All rights reserved. The codes documented in this report are preliminary and upon coder review may  be revised to meet current compliance requirements. Arta Silence, MD 12/02/2021 11:24:41 AM This report has been signed electronically. Number of Addenda: 0

## 2021-12-02 NOTE — Transfer of Care (Addendum)
Immediate Anesthesia Transfer of Care Note  Patient: Hannah Dougherty  Procedure(s) Performed: COLONOSCOPY POLYPECTOMY HEMOSTASIS CLIP PLACEMENT SUBMUCOSAL TATTOO INJECTION  Patient Location: PACU and Endoscopy Unit  Anesthesia Type:MAC  Level of Consciousness: awake, alert  and patient cooperative  Airway & Oxygen Therapy: Patient Spontanous Breathing and Patient connected to nasal cannula oxygen  Post-op Assessment: Report given to RN and Post -op Vital signs reviewed and stable  Post vital signs: Reviewed and stable. Restarted Nicardipine at 102m/hr  Last Vitals:  Vitals Value Taken Time  BP 186/138 12/02/21 1130  Temp    Pulse 90 12/02/21 1132  Resp 13 12/02/21 1132  SpO2 98 % 12/02/21 1132  Vitals shown include unvalidated device data.  Last Pain:  Vitals:   12/02/21 0901  TempSrc: Temporal  PainSc: 0-No pain      Patients Stated Pain Goal: 0 [id number]  Complications: No notable events documented.

## 2021-12-02 NOTE — Anesthesia Procedure Notes (Signed)
Procedure Name: MAC Date/Time: 12/02/2021 10:10 AM  Performed by: West Pugh, CRNAPre-anesthesia Checklist: Patient identified, Emergency Drugs available, Suction available, Patient being monitored and Timeout performed Patient Re-evaluated:Patient Re-evaluated prior to induction Oxygen Delivery Method: Simple face mask Preoxygenation: Pre-oxygenation with 100% oxygen Placement Confirmation: positive ETCO2 Dental Injury: Teeth and Oropharynx as per pre-operative assessment

## 2021-12-02 NOTE — Interval H&P Note (Signed)
History and Physical Interval Note:  12/02/2021 10:08 AM  Hannah Dougherty  has presented today for surgery, with the diagnosis of hematochezia.  The various methods of treatment have been discussed with the patient and family. After consideration of risks, benefits and other options for treatment, the patient has consented to  Procedure(s): COLONOSCOPY (N/A) as a surgical intervention.  The patient's history has been reviewed, patient examined, no change in status, stable for surgery.  I have reviewed the patient's chart and labs.  Questions were answered to the patient's satisfaction.     Landry Dyke

## 2021-12-02 NOTE — Anesthesia Postprocedure Evaluation (Signed)
Anesthesia Post Note  Patient: Hannah Dougherty  Procedure(s) Performed: COLONOSCOPY POLYPECTOMY HEMOSTASIS CLIP PLACEMENT SUBMUCOSAL TATTOO INJECTION     Patient location during evaluation: Endoscopy Anesthesia Type: MAC Level of consciousness: awake and alert Pain management: pain level controlled Vital Signs Assessment: post-procedure vital signs reviewed and stable Respiratory status: spontaneous breathing, nonlabored ventilation, respiratory function stable and patient connected to nasal cannula oxygen Cardiovascular status: stable and blood pressure returned to baseline Postop Assessment: no apparent nausea or vomiting Anesthetic complications: no   No notable events documented.  Last Vitals:  Vitals:   12/02/21 1315 12/02/21 1330  BP: 136/71 (!) 155/66  Pulse: 81 83  Resp:    Temp:    SpO2: 99% 97%    Last Pain:  Vitals:   12/02/21 1200  TempSrc: Axillary  PainSc:                  Hannah Dougherty

## 2021-12-03 ENCOUNTER — Encounter (HOSPITAL_COMMUNITY): Payer: Self-pay | Admitting: Gastroenterology

## 2021-12-03 LAB — CBC WITH DIFFERENTIAL/PLATELET
Abs Immature Granulocytes: 0.06 10*3/uL (ref 0.00–0.07)
Basophils Absolute: 0 10*3/uL (ref 0.0–0.1)
Basophils Relative: 0 %
Eosinophils Absolute: 0 10*3/uL (ref 0.0–0.5)
Eosinophils Relative: 0 %
HCT: 33.8 % — ABNORMAL LOW (ref 36.0–46.0)
Hemoglobin: 11.1 g/dL — ABNORMAL LOW (ref 12.0–15.0)
Immature Granulocytes: 1 %
Lymphocytes Relative: 20 %
Lymphs Abs: 2.4 10*3/uL (ref 0.7–4.0)
MCH: 27.6 pg (ref 26.0–34.0)
MCHC: 32.8 g/dL (ref 30.0–36.0)
MCV: 84.1 fL (ref 80.0–100.0)
Monocytes Absolute: 0.9 10*3/uL (ref 0.1–1.0)
Monocytes Relative: 8 %
Neutro Abs: 8.6 10*3/uL — ABNORMAL HIGH (ref 1.7–7.7)
Neutrophils Relative %: 71 %
Platelets: 232 10*3/uL (ref 150–400)
RBC: 4.02 MIL/uL (ref 3.87–5.11)
RDW: 14.8 % (ref 11.5–15.5)
WBC: 12.1 10*3/uL — ABNORMAL HIGH (ref 4.0–10.5)
nRBC: 0 % (ref 0.0–0.2)

## 2021-12-03 LAB — GLUCOSE, CAPILLARY
Glucose-Capillary: 157 mg/dL — ABNORMAL HIGH (ref 70–99)
Glucose-Capillary: 177 mg/dL — ABNORMAL HIGH (ref 70–99)
Glucose-Capillary: 139 mg/dL — ABNORMAL HIGH (ref 70–99)
Glucose-Capillary: 191 mg/dL — ABNORMAL HIGH (ref 70–99)
Glucose-Capillary: 187 mg/dL — ABNORMAL HIGH (ref 70–99)

## 2021-12-03 LAB — BASIC METABOLIC PANEL
Anion gap: 9 (ref 5–15)
BUN: 15 mg/dL (ref 6–20)
CO2: 21 mmol/L — ABNORMAL LOW (ref 22–32)
Calcium: 8.4 mg/dL — ABNORMAL LOW (ref 8.9–10.3)
Chloride: 107 mmol/L (ref 98–111)
Creatinine, Ser: 1.01 mg/dL — ABNORMAL HIGH (ref 0.44–1.00)
GFR, Estimated: >60 mL/min (ref >60)
Glucose, Bld: 152 mg/dL — ABNORMAL HIGH (ref 70–99)
Potassium: 3.1 mmol/L — ABNORMAL LOW (ref 3.5–5.1)
Sodium: 137 mmol/L (ref 135–145)

## 2021-12-03 LAB — MAGNESIUM: Magnesium: 1.9 mg/dL (ref 1.7–2.4)

## 2021-12-03 LAB — SURGICAL PATHOLOGY

## 2021-12-03 MED ORDER — MELATONIN 3 MG PO TABS
3.0000 mg | ORAL_TABLET | Freq: Once | ORAL | Status: AC
  Administered 2021-12-03: 3 mg via ORAL
  Filled 2021-12-03: qty 1

## 2021-12-03 MED ORDER — POTASSIUM CHLORIDE 10 MEQ/100ML IV SOLN
10.0000 meq | INTRAVENOUS | Status: AC
  Administered 2021-12-03 (×2): 10 meq via INTRAVENOUS
  Filled 2021-12-03 (×2): qty 100

## 2021-12-03 MED ORDER — POTASSIUM CHLORIDE CRYS ER 20 MEQ PO TBCR
40.0000 meq | EXTENDED_RELEASE_TABLET | ORAL | Status: AC
  Administered 2021-12-03 (×2): 40 meq via ORAL
  Filled 2021-12-03 (×2): qty 2

## 2021-12-03 MED ORDER — HYDRALAZINE HCL 50 MG PO TABS
100.0000 mg | ORAL_TABLET | Freq: Three times a day (TID) | ORAL | Status: DC
  Administered 2021-12-03 – 2021-12-04 (×5): 100 mg via ORAL
  Filled 2021-12-03 (×6): qty 2

## 2021-12-03 MED ORDER — PHENOL 1.4 % MT LIQD
1.0000 | OROMUCOSAL | Status: DC | PRN
  Filled 2021-12-03: qty 177

## 2021-12-03 MED ORDER — PROCHLORPERAZINE EDISYLATE 10 MG/2ML IJ SOLN
10.0000 mg | Freq: Once | INTRAMUSCULAR | Status: AC
  Administered 2021-12-03: 10 mg via INTRAVENOUS
  Filled 2021-12-03: qty 2

## 2021-12-03 NOTE — Progress Notes (Signed)
PROGRESS NOTE    Hannah Dougherty  YPP:[id number] DOB: [birth year] DOA: 11/29/2021 PCP: Hannah Perna, NP    Chief Complaint  Patient presents with   Rectal Bleeding    Brief Narrative:  Hannah Dougherty is a 51 yo female with PMH right renal mass, DMII, HTN who presents with hematochezia. Renal mass has been present dating back to December 2021 CT scans.  Unclear work-up at that time (or if she was able to follow up) but recently she has had difficulty following up with urology given insurance issues ([employer] declaring bankruptcy).  She underwent CTA on admission to evaluate for GI bleeding.  There was some enhancement involving the rectum concerning for possible bleeding, however after evaluation by IR, this was not considered grossly positive for extravasation.  GI was also consulted in setting of this.  She underwent colonoscopy on 9/19 removing 2 sigmoid polyps (1 large, which may have been source of bleeding). Assessment & Plan:   Principal Problem:   Hematochezia Active Problems:   Hypertensive urgency   Hypertension   Right kidney mass   Renal artery stenosis ([place])   Insurance coverage problems   DMII (diabetes mellitus, type 2) ([employer])   Hematochezia:  GI consulted and patient underwent colonoscopy on 12/02/2021, removing 2 sigmoid polyps (one of them being large and considered culprit for probable bleeding source). Bleeding scan considered negative after review by IR. Hemoglobin is stable at 11.  Continue to monitor hemoglobin.    Hypertensive Crisis on IV nicardipine, increased hydralazine to 100 mg TID, and continue with lisinopril and amlodipine.  Wean the IV nicardipine in the next 24  hours.    Right renal mass:  present dating back to CT A/P on 02/16/20; truly unclear how much she's pursued treating this; presumption certainly is RCC - mass measured 2.4 x 1.7 x 2.6 in 02/2020 and now measures 2.4 x 3.1 cm on current CTA from 11/30/21  Recommend outpatient follow  up with Urology. She reports seeing Urology in [place] and would like to follow up with them.     Renal artery stenosis:  -In view of hypertensive crisis and underlying renal mass renal duplex was obtained which showed upper range of 1 to 59% stenosis of the right renal artery at origin.  Vascular surgery consulted recommended outpatient follow-up for possible renal artery arteriogram.   Type 2 diabetes mellitus CBG (last 3)  Recent Labs    12/03/21 0327 12/03/21 0738 12/03/21 1118  GLUCAP 157* 177* 139*   CBG's better controlled.  Last hemoglobin A1c is 13.2 on March 2023. Hemoglobin A1c is 11% this admission. Continue with Semglee and sliding scale insulin.   Insurance coverage problems - Per patient, appears that her insurance company, [employer] has gone bankrupt.  She has been unable to follow-up with outpatient providers or afford medications -Discussed with patient to talk with social work about Medicaid eligibility and other options   Hypokalemia;  Replaced, recheck levels in am.     DVT prophylaxis: scd's Code Status: full code.  Family Communication: none at bedside.  Disposition:   Status is: Inpatient Remains inpatient appropriate because: uncontrolled hypertension.    Level of care: Stepdown Consultants:  Gastroenterology   Procedures: Colonoscopy.     Subjective: Nauseated, one episode of vomiting this morning with potassium pill.   Objective: Vitals:   12/03/21 1200 12/03/21 1230 12/03/21 1300 12/03/21 1330  BP: (!) 134/59 (!) 153/62 (!) 145/68 (!) 159/74  Pulse: 75 81 79 81  Resp: Marland Kitchen)  22 20 (!) 24 (!) 22  Temp:      TempSrc:      SpO2: 99% 99% 96% 93%  Weight:      Height:        Intake/Output Summary (Last 24 hours) at 12/03/2021 1347 Last data filed at 12/02/2021 1900 Gross per 24 hour  Intake 1018.84 ml  Output --  Net 1018.84 ml   Filed Weights   11/29/21 2151  Weight: 93 kg    Examination:  General exam: Appears  calm and comfortable  Respiratory system: Clear to auscultation. Respiratory effort normal. Cardiovascular system: S1 & S2 heard, RRR. No JVD,No pedal edema. Gastrointestinal system: Abdomen is nondistended, soft and nontender.  Normal bowel sounds heard. Central nervous system: Alert and oriented. No focal neurological deficits. Extremities: Symmetric 5 x 5 power. Skin: No rashes, lesions or ulcers Psychiatry: Mood & affect appropriate.     Data Reviewed: I have personally reviewed following labs and imaging studies  CBC: Recent Labs  Lab 11/27/21 1952 11/29/21 2218 11/30/21 0450 12/01/21 0303 12/02/21 0328 12/03/21 0329  WBC 7.8 7.1 7.3 11.8* 13.5* 12.1*  NEUTROABS 3.9  --   --  10.2* 10.5* 8.6*  HGB 11.2* 11.2* 10.8* 12.2 11.8* 11.1*  HCT 35.0* 34.8* 33.0* 37.5 36.7 33.8*  MCV 85.8 86.8 85.5 83.7 85.2 84.1  PLT 241 239 212 258 238 353    Basic Metabolic Panel: Recent Labs  Lab 11/29/21 2218 11/30/21 0450 12/01/21 0303 12/02/21 0328 12/03/21 0329  NA 139 134* 136 139 137  K 3.8 4.0 3.7 3.4* 3.1*  CL 105 103 103 108 107  CO2 '28 26 23 23 '$ 21*  GLUCOSE 281* 277* 323* 155* 152*  BUN '18 18 20 16 15  '$ CREATININE 1.11* 0.99 0.98 0.86 1.01*  CALCIUM 8.9 8.4* 8.8* 8.8* 8.4*  MG  --   --  1.9 1.9 1.9    GFR: Estimated Creatinine Clearance: 72.6 mL/min (A) (by C-G formula based on SCr of 1.01 mg/dL (H)).  Liver Function Tests: Recent Labs  Lab 11/27/21 1952 11/29/21 2218  AST 13* 14*  ALT 12 12  ALKPHOS 77 82  BILITOT 0.5 0.5  PROT 6.6 7.0  ALBUMIN 3.4* 3.5    CBG: Recent Labs  Lab 12/02/21 1934 12/02/21 2344 12/03/21 0327 12/03/21 0738 12/03/21 1118  GLUCAP 155* 139* 157* 177* 139*     Recent Results (from the past 240 hour(s))  MRSA Next Gen by PCR, Nasal     Status: None   Collection Time: 11/30/21  2:57 PM   Specimen: Nasal Mucosa; Nasal Swab  Result Value Ref Range Status   MRSA by PCR Next Gen NOT DETECTED NOT DETECTED Final    Comment:  (NOTE) The GeneXpert MRSA Assay (FDA approved for NASAL specimens only), is one component of a comprehensive MRSA colonization surveillance program. It is not intended to diagnose MRSA infection nor to guide or monitor treatment for MRSA infections. Test performance is not FDA approved in patients less than 14 years old. Performed at [facility], [place] [address] [place], [place] [address]          Radiology Studies: ECHOCARDIOGRAM COMPLETE  Result Date: 12/01/2021    ECHOCARDIOGRAM REPORT   Patient Name:   Hannah Dougherty Date of Exam: 12/01/2021 Medical Rec #:  [id number]    Height:       65.0 in Accession #:    [id number]   Weight:       205.0 lb Date  of Birth:  [birth year]    BSA:          1.999 m Patient Age:    75 years     BP:           189/65 mmHg Patient Gender: F            HR:           83 bpm. Exam Location:  Inpatient Procedure: 2D Echo, Color Doppler and Cardiac Doppler Indications:    Murmur  History:        Patient has no prior history of Echocardiogram examinations.                 Risk Factors:Diabetes and Hypertension.  Sonographer:    Jefferey Pica Referring Phys: [place]  1. Left ventricular ejection fraction, by estimation, is 60 to 65%. The left ventricle has normal function. The left ventricle has no regional wall motion abnormalities. There is mild concentric left ventricular hypertrophy. Left ventricular diastolic parameters were normal.  2. Right ventricular systolic function is normal. The right ventricular size is normal. Tricuspid regurgitation signal is inadequate for assessing PA pressure.  3. The mitral valve is normal in structure. No evidence of mitral valve regurgitation. No evidence of mitral stenosis.  4. The aortic valve is normal in structure. Aortic valve regurgitation is mild to moderate. No aortic stenosis is present. Aortic regurgitation PHT measures 387 msec.  5. The inferior vena cava is normal in size with <50%  respiratory variability, suggesting right atrial pressure of 8 mmHg. FINDINGS  Left Ventricle: Left ventricular ejection fraction, by estimation, is 60 to 65%. The left ventricle has normal function. The left ventricle has no regional wall motion abnormalities. The left ventricular internal cavity size was normal in size. There is  mild concentric left ventricular hypertrophy. Left ventricular diastolic parameters were normal. Normal left ventricular filling pressure. Right Ventricle: The right ventricular size is normal. No increase in right ventricular wall thickness. Right ventricular systolic function is normal. Tricuspid regurgitation signal is inadequate for assessing PA pressure. Left Atrium: Left atrial size was normal in size. Right Atrium: Right atrial size was normal in size. Pericardium: There is no evidence of pericardial effusion. Mitral Valve: The mitral valve is normal in structure. No evidence of mitral valve regurgitation. No evidence of mitral valve stenosis. Tricuspid Valve: The tricuspid valve is normal in structure. Tricuspid valve regurgitation is trivial. No evidence of tricuspid stenosis. Aortic Valve: The aortic valve is normal in structure. Aortic valve regurgitation is mild to moderate. Aortic regurgitation PHT measures 387 msec. No aortic stenosis is present. Aortic valve peak gradient measures 34.9 mmHg. Pulmonic Valve: The pulmonic valve was normal in structure. Pulmonic valve regurgitation is not visualized. No evidence of pulmonic stenosis. Aorta: The aortic root is normal in size and structure. Venous: The inferior vena cava is normal in size with less than 50% respiratory variability, suggesting right atrial pressure of 8 mmHg. IAS/Shunts: No atrial level shunt detected by color flow Doppler.  LEFT VENTRICLE PLAX 2D LVIDd:         5.10 cm   Diastology LVIDs:         2.70 cm   LV e' medial:    8.25 cm/s LV PW:         1.30 cm   LV E/e' medial:  12.6 LV IVS:        1.20 cm   LV e'  lateral:   8.43 cm/s LVOT  diam:     1.80 cm   LV E/e' lateral: 12.3 LV SV:         88 LV SV Index:   44 LVOT Area:     2.54 cm  RIGHT VENTRICLE             IVC RV Basal diam:  3.20 cm     IVC diam: 2.00 cm RV S prime:     19.40 cm/s TAPSE (M-mode): 2.7 cm LEFT ATRIUM             Index        RIGHT ATRIUM           Index LA diam:        4.80 cm 2.40 cm/m   RA Area:     17.50 cm LA Vol (A2C):   54.1 ml 27.06 ml/m  RA Volume:   47.20 ml  23.61 ml/m LA Vol (A4C):   62.2 ml 31.12 ml/m LA Biplane Vol: 59.5 ml 29.76 ml/m  AORTIC VALVE                  PULMONIC VALVE AV Area (Vmax): 1.69 cm      PV Vmax:       1.34 m/s AV Vmax:        295.50 cm/s   PV Peak grad:  7.2 mmHg AV Peak Grad:   34.9 mmHg LVOT Vmax:      196.00 cm/s LVOT Vmean:     125.000 cm/s LVOT VTI:       0.347 m AI PHT:         387 msec  AORTA Ao Root diam: 3.10 cm Ao Asc diam:  3.10 cm MITRAL VALVE MV Area (PHT): 3.97 cm     SHUNTS MV Decel Time: 191 msec     Systemic VTI:  0.35 m MV E velocity: 104.00 cm/s  Systemic Diam: 1.80 cm MV A velocity: 88.90 cm/s MV E/A ratio:  1.17 Fransico Him MD Electronically signed by Fransico Him MD Signature Date/Time: 12/01/2021/4:23:58 PM    Final         Scheduled Meds:  amLODipine  10 mg Oral Daily   Chlorhexidine Gluconate Cloth  6 each Topical Q0600   hydrALAZINE  75 mg Oral TID   insulin aspart  0-15 Units Subcutaneous Q4H   insulin aspart  0-5 Units Subcutaneous QHS   insulin glargine-yfgn  25 Units Subcutaneous Daily   lisinopril  40 mg Oral Daily   Continuous Infusions:  methocarbamol (ROBAXIN) IV     niCARDipine 10 mg/hr (12/03/21 1336)     LOS: 3 days    Time spent: 36 minutes    Hosie Poisson, MD [facility]   To contact the attending provider between 7A-7P or the covering provider during after hours 7P-7A, please log into the web site [email or web address] and access using universal [place] password for that web site. If you do not have the password, please call the  hospital operator.  12/03/2021, 1:47 PM

## 2021-12-03 NOTE — Progress Notes (Signed)
[facility] Progress Note  Hannah Dougherty 51 y.o. 29-Jan-1971  CC:  Hematochezia   Subjective: Patient seen and examined at bedside.  Somnolent but responsive and answers questions appropriately.  States she feels okay this morning.  Continues to have some vomiting after taking pills on an empty stomach, otherwise denies any nausea, fever, chills, abdominal pain, chest pain, shortness of breath.  Reports she had a bowel movement overnight and still is seeing some blood in the stool.  Denies melena.  Has a bit of a sore throat this morning.  ROS : Review of Systems  Constitutional:  Negative for chills and fever.  HENT:  Positive for sore throat.   Respiratory:  Negative for cough, shortness of breath and stridor.   Cardiovascular:  Negative for chest pain and palpitations.  Gastrointestinal:  Positive for blood in stool, diarrhea, nausea and vomiting. Negative for abdominal pain, constipation, heartburn and melena.  Genitourinary:  Negative for dysuria and urgency.  Musculoskeletal:  Negative for falls.      Objective: Vital signs in last 24 hours: Vitals:   12/03/21 1000 12/03/21 1030  BP: (!) 143/62 (!) 147/79  Pulse: 74 75  Resp: 17 (!) 25  Temp:    SpO2: 98% 98%    Physical Exam:  General:  Alert, cooperative, no distress  Head:  Normocephalic, without obvious abnormality, atraumatic  Eyes:  Anicteric sclera, EOM's intact  Lungs:   Clear to auscultation bilaterally, respirations unlabored  Heart:  Regular rate and rhythm, S1, S2 normal  Abdomen:   Soft, non-tender, bowel sounds active all four quadrants,  no masses   Extremities: Extremities normal, atraumatic, no  edema  Pulses: 2+ and symmetric    Lab Results: Recent Labs    12/02/21 0328 12/03/21 0329  NA 139 137  K 3.4* 3.1*  CL 108 107  CO2 23 21*  GLUCOSE 155* 152*  BUN 16 15  CREATININE 0.86 1.01*  CALCIUM 8.8* 8.4*  MG 1.9 1.9   No results for input(s): "AST", "ALT", "ALKPHOS", "BILITOT",  "PROT", "ALBUMIN" in the last 72 hours. Recent Labs    12/02/21 0328 12/03/21 0329  WBC 13.5* 12.1*  NEUTROABS 10.5* 8.6*  HGB 11.8* 11.1*  HCT 36.7 33.8*  MCV 85.2 84.1  PLT 238 232   No results for input(s): "LABPROT", "INR" in the last 72 hours.    Assessment Hematochezia, colon polyps - CT angio GI bleed 11/30/2021 1. Focal contrast blush in the rectum on arterial and venous phases suggesting active hemorrhage from a distal branch of the IMA 1. Rectal wall thickening with mild surrounding fat stranding, possible infectious or inflammatory proctitis. 2. Heterogeneously enhancing right renal mass measuring 3.1 x 2.4 cm, concerning for renal cell carcinoma. Urology follow-up is recommended. -Colonoscopy 12/02/2021  10 mm polyp in the distal sigmoid colon, removed.   45 mm polyp in the mid sigmoid colon, removed.  Clips placed, tattooed. Diverticulosis in the sigmoid and descending colon. Bleeding highly likely from large sigmoid polyp. - Pathology pending. - Labs 12/03/2021 show hemoglobin 11.1, WBC 12.1, BUN 15, creatinine 1.01, GFR >60  Plan: Large polyps removed on colonoscopy yesterday.  Path pending.  Will determine timing of repeat colonoscopy based on pathology report. Soft diet. Continue supportive care.  [facility] will follow.  Angelique Holm PA-C 12/03/2021, 11:12 AM  Contact #  [phone]

## 2021-12-04 ENCOUNTER — Inpatient Hospital Stay (HOSPITAL_COMMUNITY): Payer: Self-pay

## 2021-12-04 LAB — CBC WITH DIFFERENTIAL/PLATELET
Abs Immature Granulocytes: 0.03 10*3/uL (ref 0.00–0.07)
Basophils Absolute: 0 10*3/uL (ref 0.0–0.1)
Basophils Relative: 0 %
Eosinophils Absolute: 0 10*3/uL (ref 0.0–0.5)
Eosinophils Relative: 1 %
HCT: 32.4 % — ABNORMAL LOW (ref 36.0–46.0)
Hemoglobin: 10.5 g/dL — ABNORMAL LOW (ref 12.0–15.0)
Immature Granulocytes: 0 %
Lymphocytes Relative: 33 %
Lymphs Abs: 2.7 10*3/uL (ref 0.7–4.0)
MCH: 27.5 pg (ref 26.0–34.0)
MCHC: 32.4 g/dL (ref 30.0–36.0)
MCV: 84.8 fL (ref 80.0–100.0)
Monocytes Absolute: 0.9 10*3/uL (ref 0.1–1.0)
Monocytes Relative: 11 %
Neutro Abs: 4.7 10*3/uL (ref 1.7–7.7)
Neutrophils Relative %: 55 %
Platelets: 217 10*3/uL (ref 150–400)
RBC: 3.82 MIL/uL — ABNORMAL LOW (ref 3.87–5.11)
RDW: 14.9 % (ref 11.5–15.5)
WBC: 8.4 10*3/uL (ref 4.0–10.5)
nRBC: 0 % (ref 0.0–0.2)

## 2021-12-04 LAB — BASIC METABOLIC PANEL
Anion gap: 5 (ref 5–15)
BUN: 14 mg/dL (ref 6–20)
CO2: 22 mmol/L (ref 22–32)
Calcium: 8.4 mg/dL — ABNORMAL LOW (ref 8.9–10.3)
Chloride: 109 mmol/L (ref 98–111)
Creatinine, Ser: 1.18 mg/dL — ABNORMAL HIGH (ref 0.44–1.00)
GFR, Estimated: 55 mL/min — ABNORMAL LOW (ref >60)
Glucose, Bld: 100 mg/dL — ABNORMAL HIGH (ref 70–99)
Potassium: 3.4 mmol/L — ABNORMAL LOW (ref 3.5–5.1)
Sodium: 136 mmol/L (ref 135–145)

## 2021-12-04 LAB — GLUCOSE, CAPILLARY
Glucose-Capillary: 96 mg/dL (ref 70–99)
Glucose-Capillary: 105 mg/dL — ABNORMAL HIGH (ref 70–99)
Glucose-Capillary: 212 mg/dL — ABNORMAL HIGH (ref 70–99)
Glucose-Capillary: 85 mg/dL (ref 70–99)
Glucose-Capillary: 172 mg/dL — ABNORMAL HIGH (ref 70–99)

## 2021-12-04 LAB — MAGNESIUM: Magnesium: 1.9 mg/dL (ref 1.7–2.4)

## 2021-12-04 MED ORDER — CARVEDILOL 6.25 MG PO TABS
6.2500 mg | ORAL_TABLET | Freq: Two times a day (BID) | ORAL | Status: DC
  Administered 2021-12-04 – 2021-12-05 (×2): 6.25 mg via ORAL
  Filled 2021-12-04 (×2): qty 1

## 2021-12-04 MED ORDER — CHLORHEXIDINE GLUCONATE CLOTH 2 % EX PADS
6.0000 | MEDICATED_PAD | Freq: Every day | CUTANEOUS | Status: DC
  Administered 2021-12-04: 6 via TOPICAL

## 2021-12-04 MED ORDER — TRAMADOL HCL 50 MG PO TABS: 50.0000 mg | ORAL_TABLET | Freq: Two times a day (BID) | ORAL | Status: DC | PRN

## 2021-12-04 MED ORDER — POTASSIUM CHLORIDE 10 MEQ/100ML IV SOLN
10.0000 meq | INTRAVENOUS | Status: AC
  Administered 2021-12-04: 10 meq via INTRAVENOUS
  Filled 2021-12-04 (×2): qty 100

## 2021-12-04 MED ORDER — CARVEDILOL 12.5 MG PO TABS
12.5000 mg | ORAL_TABLET | Freq: Two times a day (BID) | ORAL | Status: DC
  Administered 2021-12-04: 12.5 mg via ORAL
  Filled 2021-12-04: qty 1

## 2021-12-04 MED ORDER — HYDROCHLOROTHIAZIDE 25 MG PO TABS
25.0000 mg | ORAL_TABLET | Freq: Every day | ORAL | Status: DC
  Administered 2021-12-04: 25 mg via ORAL
  Filled 2021-12-04 (×2): qty 1

## 2021-12-04 NOTE — Progress Notes (Signed)
PROGRESS NOTE    Hannah Dougherty  ZCH:[id number] DOB: October 08, 1970 DOA: 11/29/2021 PCP: [name], NP    Chief Complaint  Patient presents with   Rectal Bleeding    Brief Narrative:  Hannah Dougherty is a 51 yo female with PMH right renal mass, DMII, HTN who presents with hematochezia. Renal mass has been present dating back to December 2021 CT scans.  Unclear work-up at that time (or if she was able to follow up) but recently she has had difficulty following up with urology given insurance issues ([employer] declaring bankruptcy).  She underwent CTA on admission to evaluate for GI bleeding.  There was some enhancement involving the rectum concerning for possible bleeding, however after evaluation by IR, this was not considered grossly positive for extravasation.  GI was also consulted in setting of this.  She underwent colonoscopy on 9/19 removing 2 sigmoid polyps (1 large, which may have been source of bleeding). Assessment & Plan:   Principal Problem:   Hematochezia Active Problems:   Hypertensive urgency   Hypertension   Right kidney mass   Renal artery stenosis ([place])   Insurance coverage problems   DMII (diabetes mellitus, type 2) ([place])   Hematochezia:  GI consulted and patient underwent colonoscopy on 12/02/2021, removing 2 sigmoid polyps (one of them being large and considered culprit for probable bleeding source). Bleeding scan considered negative after review by IR. Hemoglobin is stable between 10 to 11.  Continue to monitor hemoglobin.  No nausea, vomiting in the last 24 hours.  Await pathology report.    Hypertensive Crisis on IV nicardipine,  increased hydralazine to 100 mg TID,  On lisinopril 40 mg daily.  Amlodipine 10 mg daily.  Add coreg 12.5 mg BID and hydrochlorothiazide 25 mg daily.   Wean the IV nicardipine in the next 24  hours.    Right renal mass:  present dating back to CT A/P on 02/16/20; truly unclear how much she's pursued treating this;  presumption certainly is RCC - mass measured 2.4 x 1.7 x 2.6 in 02/2020 and now measures 2.4 x 3.1 cm on current CTA from 11/30/21  Recommend outpatient follow up with Urology. She reports seeing Urology in [place] and would like to follow up with them.     Renal artery stenosis:  -In view of hypertensive crisis and underlying renal mass renal duplex was obtained which showed upper range of 1 to 59% stenosis of the right renal artery at origin.  Vascular surgery consulted recommended outpatient follow-up for possible renal artery arteriogram.   Type 2 diabetes mellitus CBG (last 3)  Recent Labs    12/03/21 1957 12/04/21 0340 12/04/21 0754  GLUCAP 187* 96 105*    CBG's better controlled.  Last hemoglobin A1c is 13.2 on March 2023. Hemoglobin A1c is 11% this admission. Continue with Semglee and sliding scale insulin. No changes in medications.    Insurance coverage problems - Per patient, appears that her insurance company, [employer] has gone bankrupt.  She has been unable to follow-up with outpatient providers or afford medications -Discussed with patient to talk with social work about Medicaid eligibility and other options   Hypokalemia;  Replaced, recheck levels in am.     DVT prophylaxis: scd's Code Status: full code.  Family Communication: none at bedside.  Disposition:   Status is: Inpatient Remains inpatient appropriate because: uncontrolled hypertension. On IV cardene gtt.    Level of care: Stepdown Consultants:  Gastroenterology   Procedures: Colonoscopy.  Subjective: One episode of nausea earlier today.  BP much better.  No vomiting.    Objective: Vitals:   12/04/21 0530 12/04/21 0600 12/04/21 0730 12/04/21 0800  BP: (!) 140/51 (!) 164/69 (!) 146/65 (!) 159/66  Pulse: 80 75 82 87  Resp: 17 (!) 23 19 (!) 25  Temp:    98.6 F (37 C)  TempSrc:    Oral  SpO2: 96% 92% 98% 99%  Weight:      Height:        Intake/Output Summary  (Last 24 hours) at 12/04/2021 1017 Last data filed at 12/04/2021 0800 Gross per 24 hour  Intake 1689.05 ml  Output --  Net 1689.05 ml    Filed Weights   11/29/21 2151  Weight: 93 kg    Examination: General exam: Appears calm and comfortable  Respiratory system: Clear to auscultation. Respiratory effort normal. Cardiovascular system: S1 & S2 heard, RRR. No JVD, No pedal edema. Gastrointestinal system: Abdomen is nondistended, soft and nontender.  Normal bowel sounds heard. Central nervous system: Alert and oriented. No focal neurological deficits. Extremities: Symmetric 5 x 5 power. Skin: No rashes, lesions or ulcers Psychiatry: Judgement and insight appear normal. Mood & affect appropriate.      Data Reviewed: I have personally reviewed following labs and imaging studies  CBC: Recent Labs  Lab 11/27/21 1952 11/29/21 2218 11/30/21 0450 12/01/21 0303 12/02/21 0328 12/03/21 0329 12/04/21 0338  WBC 7.8   < > 7.3 11.8* 13.5* 12.1* 8.4  NEUTROABS 3.9  --   --  10.2* 10.5* 8.6* 4.7  HGB 11.2*   < > 10.8* 12.2 11.8* 11.1* 10.5*  HCT 35.0*   < > 33.0* 37.5 36.7 33.8* 32.4*  MCV 85.8   < > 85.5 83.7 85.2 84.1 84.8  PLT 241   < > 212 258 238 232 217   < > = values in this interval not displayed.     Basic Metabolic Panel: Recent Labs  Lab 11/30/21 0450 12/01/21 0303 12/02/21 0328 12/03/21 0329 12/04/21 0338  NA 134* 136 139 137 136  K 4.0 3.7 3.4* 3.1* 3.4*  CL 103 103 108 107 109  CO2 '26 23 23 '$ 21* 22  GLUCOSE 277* 323* 155* 152* 100*  BUN '18 20 16 15 14  '$ CREATININE 0.99 0.98 0.86 1.01* 1.18*  CALCIUM 8.4* 8.8* 8.8* 8.4* 8.4*  MG  --  1.9 1.9 1.9 1.9     GFR: Estimated Creatinine Clearance: 62.1 mL/min (A) (by C-G formula based on SCr of 1.18 mg/dL (H)).  Liver Function Tests: Recent Labs  Lab 11/27/21 1952 11/29/21 2218  AST 13* 14*  ALT 12 12  ALKPHOS 77 82  BILITOT 0.5 0.5  PROT 6.6 7.0  ALBUMIN 3.4* 3.5     CBG: Recent Labs  Lab  12/03/21 1118 12/03/21 1613 12/03/21 1957 12/04/21 0340 12/04/21 0754  GLUCAP 139* 191* 187* 96 105*      Recent Results (from the past 240 hour(s))  MRSA Next Gen by PCR, Nasal     Status: None   Collection Time: 11/30/21  2:57 PM   Specimen: Nasal Mucosa; Nasal Swab  Result Value Ref Range Status   MRSA by PCR Next Gen NOT DETECTED NOT DETECTED Final    Comment: (NOTE) The GeneXpert MRSA Assay (FDA approved for NASAL specimens only), is one component of a comprehensive MRSA colonization surveillance program. It is not intended to diagnose MRSA infection nor to guide or monitor treatment for MRSA infections. Test performance  is not FDA approved in patients less than 69 years old. Performed at [facility], [place] [address] [place], [place] [address]          Radiology Studies: No results found.      Scheduled Meds:  amLODipine  10 mg Oral Daily   carvedilol  12.5 mg Oral BID WC   Chlorhexidine Gluconate Cloth  6 each Topical Q0600   hydrALAZINE  100 mg Oral TID   hydrochlorothiazide  25 mg Oral Daily   insulin aspart  0-15 Units Subcutaneous Q4H   insulin aspart  0-5 Units Subcutaneous QHS   insulin glargine-yfgn  25 Units Subcutaneous Daily   lisinopril  40 mg Oral Daily   Continuous Infusions:  methocarbamol (ROBAXIN) IV     niCARDipine 5 mg/hr (12/04/21 0800)     LOS: 4 days    Time spent: 39 minutes.     Hosie Poisson, MD [facility]   To contact the attending provider between 7A-7P or the covering provider during after hours 7P-7A, please log into the web site [email or web address] and access using universal [place] password for that web site. If you do not have the password, please call the hospital operator.  12/04/2021, 10:17 AM

## 2021-12-05 ENCOUNTER — Inpatient Hospital Stay (HOSPITAL_BASED_OUTPATIENT_CLINIC_OR_DEPARTMENT_OTHER): Payer: Self-pay

## 2021-12-05 ENCOUNTER — Other Ambulatory Visit: Payer: Self-pay

## 2021-12-05 DIAGNOSIS — M7989 Other specified soft tissue disorders: Secondary | ICD-10-CM

## 2021-12-05 LAB — BASIC METABOLIC PANEL
Anion gap: 7 (ref 5–15)
BUN: 13 mg/dL (ref 6–20)
CO2: 24 mmol/L (ref 22–32)
Calcium: 8.2 mg/dL — ABNORMAL LOW (ref 8.9–10.3)
Chloride: 100 mmol/L (ref 98–111)
Creatinine, Ser: 0.98 mg/dL (ref 0.44–1.00)
GFR, Estimated: >60 mL/min (ref >60)
Glucose, Bld: 151 mg/dL — ABNORMAL HIGH (ref 70–99)
Potassium: 3.6 mmol/L (ref 3.5–5.1)
Sodium: 131 mmol/L — ABNORMAL LOW (ref 135–145)

## 2021-12-05 LAB — CBC WITH DIFFERENTIAL/PLATELET
Abs Immature Granulocytes: 0.03 10*3/uL (ref 0.00–0.07)
Basophils Absolute: 0 10*3/uL (ref 0.0–0.1)
Basophils Relative: 0 %
Eosinophils Absolute: 0.1 10*3/uL (ref 0.0–0.5)
Eosinophils Relative: 2 %
HCT: 34.6 % — ABNORMAL LOW (ref 36.0–46.0)
Hemoglobin: 11.3 g/dL — ABNORMAL LOW (ref 12.0–15.0)
Immature Granulocytes: 0 %
Lymphocytes Relative: 26 %
Lymphs Abs: 2.1 10*3/uL (ref 0.7–4.0)
MCH: 27 pg (ref 26.0–34.0)
MCHC: 32.7 g/dL (ref 30.0–36.0)
MCV: 82.8 fL (ref 80.0–100.0)
Monocytes Absolute: 0.9 10*3/uL (ref 0.1–1.0)
Monocytes Relative: 11 %
Neutro Abs: 5 10*3/uL (ref 1.7–7.7)
Neutrophils Relative %: 61 %
Platelets: 234 10*3/uL (ref 150–400)
RBC: 4.18 MIL/uL (ref 3.87–5.11)
RDW: 14.6 % (ref 11.5–15.5)
WBC: 8.2 10*3/uL (ref 4.0–10.5)
nRBC: 0 % (ref 0.0–0.2)

## 2021-12-05 LAB — GLUCOSE, CAPILLARY
Glucose-Capillary: 146 mg/dL — ABNORMAL HIGH (ref 70–99)
Glucose-Capillary: 88 mg/dL (ref 70–99)
Glucose-Capillary: 104 mg/dL — ABNORMAL HIGH (ref 70–99)
Glucose-Capillary: 149 mg/dL — ABNORMAL HIGH (ref 70–99)

## 2021-12-05 LAB — MAGNESIUM: Magnesium: 1.9 mg/dL (ref 1.7–2.4)

## 2021-12-05 MED ORDER — AMLODIPINE BESYLATE 5 MG PO TABS
5.0000 mg | ORAL_TABLET | Freq: Every day | ORAL | 3 refills | Status: DC
  Filled 2021-12-05 – 2021-12-17 (×2): qty 30, 30d supply, fill #0

## 2021-12-05 MED ORDER — CARVEDILOL 6.25 MG PO TABS
6.2500 mg | ORAL_TABLET | Freq: Two times a day (BID) | ORAL | 3 refills | Status: DC
  Filled 2021-12-05 – 2021-12-17 (×2): qty 60, 30d supply, fill #0

## 2021-12-05 MED ORDER — MELATONIN 3 MG PO TABS
6.0000 mg | ORAL_TABLET | Freq: Every evening | ORAL | Status: DC | PRN
  Administered 2021-12-05: 6 mg via ORAL
  Filled 2021-12-05: qty 2

## 2021-12-05 MED ORDER — PRAVASTATIN SODIUM 40 MG PO TABS
40.0000 mg | ORAL_TABLET | Freq: Every day | ORAL | 1 refills | Status: DC
  Filled 2021-12-05 – 2021-12-17 (×2): qty 90, 90d supply, fill #0

## 2021-12-05 MED ORDER — HYDRALAZINE HCL 50 MG PO TABS: 50.0000 mg | ORAL_TABLET | Freq: Three times a day (TID) | ORAL | Status: DC

## 2021-12-05 MED ORDER — SEMGLEE (YFGN) 100 UNIT/ML ~~LOC~~ SOPN
20.0000 [IU] | PEN_INJECTOR | Freq: Every day | SUBCUTANEOUS | 2 refills | Status: DC
  Filled 2021-12-05 – 2021-12-17 (×2): qty 6, 30d supply, fill #0

## 2021-12-05 MED ORDER — METFORMIN HCL 1000 MG PO TABS
1000.0000 mg | ORAL_TABLET | Freq: Two times a day (BID) | ORAL | 3 refills | Status: AC
  Filled 2021-12-05: qty 180, 90d supply, fill #0

## 2021-12-05 MED ORDER — AMLODIPINE BESYLATE 5 MG PO TABS: 5.0000 mg | ORAL_TABLET | Freq: Every day | ORAL | Status: DC

## 2021-12-05 MED ORDER — LISINOPRIL 20 MG PO TABS
20.0000 mg | ORAL_TABLET | Freq: Every day | ORAL | 3 refills | Status: DC
  Filled 2021-12-05 – 2021-12-17 (×2): qty 30, 30d supply, fill #0

## 2021-12-05 NOTE — TOC Transition Note (Signed)
Transition of Care [facility]) - CM/SW Discharge Note   Patient Details  Name: Hannah Dougherty MRN: [id number] Date of Birth: [birth year]  [facility] CM/SW Contact:  Roseanne Kaufman, RN Phone Number: 12/05/2021, 12:01 PM   Clinical Narrative:   [facility] consult for medication assistance, patient currently in lab unable to discuss medication assistance.  Resources for insurance has been placed on AVS.   TOC will continue to follow.      Barriers to Discharge: Continued Medical Work up   Patient Goals and CMS Choice Patient states their goals for this hospitalization and ongoing recovery are:: home with self care   Choice offered to / list presented to : NA  Discharge Placement                       Discharge Plan and Services In-house Referral: NA Discharge Planning Services: CM Consult Post Acute Care Choice: NA          DME Arranged: N/A DME Agency: NA       HH Arranged: NA HH Agency: NA        Social Determinants of Health (SDOH) Interventions Housing Interventions: Patient Refused   Readmission Risk Interventions     No data to display

## 2021-12-05 NOTE — Discharge Summary (Signed)
Physician Discharge Summary   Patient: Hannah Dougherty MRN: [id number] DOB: 1971-01-02  Admit date:     11/29/2021  Discharge date: 12/05/21  Discharge Physician: Hosie Poisson   PCP: [name], NP   Recommendations at discharge:  Please follow up with PCP in one week.  Please follow up with GI regarding the biopsy results.  Please check cbc and BMP in one week.  Please follow up with Urology regarding the renal mass.   Discharge Diagnoses: Principal Problem:   Hematochezia Active Problems:   Hypertensive urgency   Hypertension   Right kidney mass   Renal artery stenosis ([place])   Insurance coverage problems   DMII (diabetes mellitus, type 2) [facility])    Hospital Course:  Mr. Porada is a 51 yo female with PMH right renal mass, DMII, HTN who presents with hematochezia. Renal mass has been present dating back to December 2021 CT scans.  Unclear work-up at that time (or if she was able to follow up) but recently she has had difficulty following up with urology given insurance issues ([employer] declaring bankruptcy).  She underwent CTA on admission to evaluate for GI bleeding.  There was some enhancement involving the rectum concerning for possible bleeding, however after evaluation by IR, this was not considered grossly positive for extravasation.  GI was also consulted in setting of this.  She underwent colonoscopy on 9/19 removing 2 sigmoid polyps (1 large, which may have been source of bleeding). Assessment and Plan: Hematochezia:  GI consulted and patient underwent colonoscopy on 12/02/2021, removing 2 sigmoid polyps (one of them being large and considered culprit for probable bleeding source). Bleeding scan considered negative after review by IR. Hemoglobin is stable between 10 to 11.  Continue to monitor hemoglobin.  No nausea, vomiting in the last 24 hours.  Await pathology report. recommend outpatient follow up with GI regarding the biopsy report.      Hypertensive  Crisis  resolved. Restarted home medications.    Hyponatremia possibly from the hydrochlorothiazide, it was discontinued on discharge.    Right renal mass:  present dating back to CT A/P on 02/16/20; truly unclear how much she's pursued treating this; presumption certainly is RCC - mass measured 2.4 x 1.7 x 2.6 in 02/2020 and now measures 2.4 x 3.1 cm on current CTA from 11/30/21   Recommend outpatient follow up with Urology. She reports seeing Urology in [place] and would like to follow up with them.        Renal artery stenosis:  -In view of hypertensive crisis and underlying renal mass renal duplex was obtained which showed upper range of 1 to 59% stenosis of the right renal artery at origin.  Vascular surgery consulted recommended outpatient follow-up for possible renal artery arteriogram.     Type 2 diabetes mellitus Last hemoglobin A1c is 13.2 on March 2023. Hemoglobin A1c is 11% this admission. Continue with Semglee and sliding scale insulin. No changes in medications.      Insurance coverage problems - Per patient, appears that her insurance company, [employer] has gone bankrupt.  She has been unable to follow-up with outpatient providers or afford medications -Discussed with patient to talk with social work about Medicaid eligibility and other options     Hypokalemia;  Replaced, recheck levels in am.           Consultants: gi Procedures performed: COLONOSCOPY  Disposition: Home Diet recommendation:  Discharge Diet Orders (From admission, onward)     Start  Ordered   12/05/21 0000  Diet - low sodium heart healthy        12/05/21 1126           Regular diet DISCHARGE MEDICATION: Allergies as of 12/05/2021   No Known Allergies      Medication List     STOP taking these medications    hydrochlorothiazide 12.5 MG tablet Commonly known as: HYDRODIURIL   insulin glargine 100 UNIT/ML Solostar Pen Commonly known as: LANTUS   nicotine 21  mg/24hr patch Commonly known as: Nicoderm CQ       TAKE these medications    amLODipine 5 MG tablet Commonly known as: NORVASC Take 1 tablet (5 mg total) by mouth daily.   carvedilol 6.25 MG tablet Commonly known as: COREG Take 1 tablet (6.25 mg total) by mouth 2 (two) times daily with a meal.   lisinopril 20 MG tablet Commonly known as: ZESTRIL Take 1 tablet (20 mg total) by mouth daily.   metFORMIN 1000 MG tablet Commonly known as: GLUCOPHAGE Take 1 tablet (1,000 mg total) by mouth 2 (two) times daily with a meal.   pravastatin 40 MG tablet Commonly known as: PRAVACHOL Take 1 tablet (40 mg total) by mouth daily.   ReliOn Confirm/micro Test test strip Generic drug: glucose blood Use as instructed   ReliOn Lancet Devices 30G Misc 1 each by Does not apply route 3 (three) times daily.   Semglee (yfgn) 100 UNIT/ML Pen Generic drug: insulin glargine-yfgn Inject 20 Units into the skin at bedtime.        Discharge Exam: Filed Weights   11/29/21 2151  Weight: 93 kg   General exam: Appears calm and comfortable  Respiratory system: Clear to auscultation. Respiratory effort normal. Cardiovascular system: S1 & S2 heard, RRR. No JVD, No pedal edema. Gastrointestinal system: Abdomen is nondistended, soft and nontender. Normal bowel sounds heard. Central nervous system: Alert and oriented. No focal neurological deficits. Extremities:  UPPER extremity swelling.  Skin: No rashes, lesions or ulcers Psychiatry:Mood & affect appropriate.    Condition at discharge: fair  The results of significant diagnostics from this hospitalization (including imaging, microbiology, ancillary and laboratory) are listed below for reference.   Imaging Studies: VAS [place] UPPER EXTREMITY VENOUS DUPLEX  Result Date: 12/05/2021 UPPER VENOUS STUDY  Patient Name:  Hannah Dougherty  Date of Exam:   12/05/2021 Medical Rec #: [id number]     Accession #:    [id number] Date of Birth: 01-16-1971     Patient  Gender: F Patient Age:   51 years Exam Location:  [facility] Procedure:      VAS [place] UPPER EXTREMITY VENOUS DUPLEX Referring Phys: Hosie Poisson --------------------------------------------------------------------------------  Indications: Swelling Risk Factors: None identified. Comparison Study: No prior studies. Performing Technologist: Oliver Hum RVT  Examination Guidelines: A complete evaluation includes B-mode imaging, spectral Doppler, color Doppler, and power Doppler as needed of all accessible portions of each vessel. Bilateral testing is considered an integral part of a complete examination. Limited examinations for reoccurring indications may be performed as noted.  Right Findings: +----------+------------+---------+-----------+----------+-------+ RIGHT     CompressiblePhasicitySpontaneousPropertiesSummary +----------+------------+---------+-----------+----------+-------+ IJV           Full       Yes       Yes                      +----------+------------+---------+-----------+----------+-------+ Subclavian    Full       Yes  Yes                      +----------+------------+---------+-----------+----------+-------+ Axillary      Full       Yes       Yes                      +----------+------------+---------+-----------+----------+-------+ Brachial      Full       Yes       Yes                      +----------+------------+---------+-----------+----------+-------+ Radial        Full                                          +----------+------------+---------+-----------+----------+-------+ Ulnar         Full                                          +----------+------------+---------+-----------+----------+-------+ Cephalic      Full                                          +----------+------------+---------+-----------+----------+-------+ Basilic       None                                   Acute   +----------+------------+---------+-----------+----------+-------+  Left Findings: +----------+------------+---------+-----------+----------+-------+ LEFT      CompressiblePhasicitySpontaneousPropertiesSummary +----------+------------+---------+-----------+----------+-------+ IJV           Full       Yes       Yes                      +----------+------------+---------+-----------+----------+-------+ Subclavian    Full       Yes       Yes                      +----------+------------+---------+-----------+----------+-------+ Axillary      Full       Yes       Yes                      +----------+------------+---------+-----------+----------+-------+ Brachial      Full       Yes       Yes                      +----------+------------+---------+-----------+----------+-------+ Radial        Full                                          +----------+------------+---------+-----------+----------+-------+ Ulnar         Full                                          +----------+------------+---------+-----------+----------+-------+ Cephalic  None                                   Acute  +----------+------------+---------+-----------+----------+-------+ Basilic       Full                                          +----------+------------+---------+-----------+----------+-------+  Summary:  Right: No evidence of deep vein thrombosis in the upper extremity. Findings consistent with acute superficial vein thrombosis involving the right basilic vein.  Left: No evidence of deep vein thrombosis in the upper extremity. Findings consistent with acute superficial vein thrombosis involving the left cephalic vein.  *See table(s) above for measurements and observations.    Preliminary    CT HEAD WO CONTRAST (5MM)  Result Date: 12/04/2021 CLINICAL DATA:  Transient ischemic attack.  Right facial numbness. EXAM: CT HEAD WITHOUT CONTRAST TECHNIQUE: Contiguous axial images  were obtained from the base of the skull through the vertex without intravenous contrast. RADIATION DOSE REDUCTION: This exam was performed according to the departmental dose-optimization program which includes automated exposure control, adjustment of the mA and/or kV according to patient size and/or use of iterative reconstruction technique. COMPARISON:  CT head 06/10/2021.  MRI head 06/10/2021. FINDINGS: Brain: There is no evidence of acute intracranial hemorrhage, mass lesion, brain edema or extra-axial fluid collection. The ventricles and subarachnoid spaces are appropriately sized for age. There is no CT evidence of acute cortical infarction. Stable mild chronic encephalomalacia within the occipital lobes bilaterally. Vascular:  No hyperdense vessel identified. Skull: Negative for fracture or focal lesion. Sinuses/Orbits: The visualized paranasal sinuses and mastoid air cells are clear. No orbital abnormalities are seen. Other: None. IMPRESSION: Stable bi-occipital encephalomalacia. No acute intracranial findings. Electronically Signed   By: Richardean Sale M.D.   On: 12/04/2021 11:42   ECHOCARDIOGRAM COMPLETE  Result Date: 12/01/2021    ECHOCARDIOGRAM REPORT   Patient Name:   Hannah Dougherty Date of Exam: 12/01/2021 Medical Rec #:  [id number]    Height:       65.0 in Accession #:    [id number]   Weight:       205.0 lb Date of Birth:  [birth year]    BSA:          1.999 m Patient Age:    67 years     BP:           189/65 mmHg Patient Gender: F            HR:           83 bpm. Exam Location:  Inpatient Procedure: 2D Echo, Color Doppler and Cardiac Doppler Indications:    Murmur  History:        Patient has no prior history of Echocardiogram examinations.                 Risk Factors:Diabetes and Hypertension.  Sonographer:    Jefferey Pica Referring Phys: [place]  1. Left ventricular ejection fraction, by estimation, is 60 to 65%. The left ventricle has normal function. The left ventricle  has no regional wall motion abnormalities. There is mild concentric left ventricular hypertrophy. Left ventricular diastolic parameters were normal.  2. Right ventricular systolic function is normal. The right ventricular size is normal. Tricuspid regurgitation signal is inadequate for assessing PA pressure.  3. The mitral valve is normal in structure. No evidence of mitral valve regurgitation. No evidence of mitral stenosis.  4. The aortic valve is normal in structure. Aortic valve regurgitation is mild to moderate. No aortic stenosis is present. Aortic regurgitation PHT measures 387 msec.  5. The inferior vena cava is normal in size with <50% respiratory variability, suggesting right atrial pressure of 8 mmHg. FINDINGS  Left Ventricle: Left ventricular ejection fraction, by estimation, is 60 to 65%. The left ventricle has normal function. The left ventricle has no regional wall motion abnormalities. The left ventricular internal cavity size was normal in size. There is  mild concentric left ventricular hypertrophy. Left ventricular diastolic parameters were normal. Normal left ventricular filling pressure. Right Ventricle: The right ventricular size is normal. No increase in right ventricular wall thickness. Right ventricular systolic function is normal. Tricuspid regurgitation signal is inadequate for assessing PA pressure. Left Atrium: Left atrial size was normal in size. Right Atrium: Right atrial size was normal in size. Pericardium: There is no evidence of pericardial effusion. Mitral Valve: The mitral valve is normal in structure. No evidence of mitral valve regurgitation. No evidence of mitral valve stenosis. Tricuspid Valve: The tricuspid valve is normal in structure. Tricuspid valve regurgitation is trivial. No evidence of tricuspid stenosis. Aortic Valve: The aortic valve is normal in structure. Aortic valve regurgitation is mild to moderate. Aortic regurgitation PHT measures 387 msec. No aortic stenosis  is present. Aortic valve peak gradient measures 34.9 mmHg. Pulmonic Valve: The pulmonic valve was normal in structure. Pulmonic valve regurgitation is not visualized. No evidence of pulmonic stenosis. Aorta: The aortic root is normal in size and structure. Venous: The inferior vena cava is normal in size with less than 50% respiratory variability, suggesting right atrial pressure of 8 mmHg. IAS/Shunts: No atrial level shunt detected by color flow Doppler.  LEFT VENTRICLE PLAX 2D LVIDd:         5.10 cm   Diastology LVIDs:         2.70 cm   LV e' medial:    8.25 cm/s LV PW:         1.30 cm   LV E/e' medial:  12.6 LV IVS:        1.20 cm   LV e' lateral:   8.43 cm/s LVOT diam:     1.80 cm   LV E/e' lateral: 12.3 LV SV:         88 LV SV Index:   44 LVOT Area:     2.54 cm  RIGHT VENTRICLE             IVC RV Basal diam:  3.20 cm     IVC diam: 2.00 cm RV S prime:     19.40 cm/s TAPSE (M-mode): 2.7 cm LEFT ATRIUM             Index        RIGHT ATRIUM           Index LA diam:        4.80 cm 2.40 cm/m   RA Area:     17.50 cm LA Vol (A2C):   54.1 ml 27.06 ml/m  RA Volume:   47.20 ml  23.61 ml/m LA Vol (A4C):   62.2 ml 31.12 ml/m LA Biplane Vol: 59.5 ml 29.76 ml/m  AORTIC VALVE                  PULMONIC VALVE AV Area (Vmax): 1.69 cm  PV Vmax:       1.34 m/s AV Vmax:        295.50 cm/s   PV Peak grad:  7.2 mmHg AV Peak Grad:   34.9 mmHg LVOT Vmax:      196.00 cm/s LVOT Vmean:     125.000 cm/s LVOT VTI:       0.347 m AI PHT:         387 msec  AORTA Ao Root diam: 3.10 cm Ao Asc diam:  3.10 cm MITRAL VALVE MV Area (PHT): 3.97 cm     SHUNTS MV Decel Time: 191 msec     Systemic VTI:  0.35 m MV E velocity: 104.00 cm/s  Systemic Diam: 1.80 cm MV A velocity: 88.90 cm/s MV E/A ratio:  1.17 Fransico Him MD Electronically signed by Fransico Him MD Signature Date/Time: 12/01/2021/4:23:58 PM    Final    VAS US RENAL ARTERY DUPLEX  Result Date: 12/01/2021 ABDOMINAL VISCERAL Patient Name:  Hannah Dougherty  Date of Exam:   12/01/2021  Medical Rec #: [id number]     Accession #:    [id number] Date of Birth: 26-Oct-1970     Patient Gender: F Patient Age:   39 years Exam Location:  [facility] Procedure:      VAS US RENAL ARTERY DUPLEX Referring Phys: [place] -------------------------------------------------------------------------------- Indications: Hypertension, right lower pole renal mass seen on CTA 11/30/2021 Limitations: Air/bowel gas. Comparison Study: No prior study Performing Technologist: Maudry Mayhew MHA, RDMS, RVT, RDCS  Examination Guidelines: A complete evaluation includes B-mode imaging, spectral Doppler, color Doppler, and power Doppler as needed of all accessible portions of each vessel. Bilateral testing is considered an integral part of a complete examination. Limited examinations for reoccurring indications may be performed as noted.  Duplex Findings: +--------------------+--------+--------+------+--------+ Mesenteric          PSV cm/sEDV cm/sPlaqueComments +--------------------+--------+--------+------+--------+ Aorta Prox            173                          +--------------------+--------+--------+------+--------+ Celiac Artery Origin  289                          +--------------------+--------+--------+------+--------+ SMA Proximal          557      72                  +--------------------+--------+--------+------+--------+    +------------------+--------+--------+-------+ Right Renal ArteryPSV cm/sEDV cm/sComment +------------------+--------+--------+-------+ Origin              487      39           +------------------+--------+--------+-------+ Proximal            550     113           +------------------+--------+--------+-------+ Mid                 210      44           +------------------+--------+--------+-------+ Distal              105      29           +------------------+--------+--------+-------+  +-----------------+--------+--------+-------+ Left Renal ArteryPSV cm/sEDV cm/sComment +-----------------+--------+--------+-------+ Origin             186      40           +-----------------+--------+--------+-------+  Proximal           235      16           +-----------------+--------+--------+-------+ Mid                144      27           +-----------------+--------+--------+-------+ Distal             196      34           +-----------------+--------+--------+-------+  Technologist observations: There is a vascularized lesion at the lower pole of the right kidney, isoechoic to the kidney, measuring 3.0 x 2.7 x 3.0cm. This is correlates with right renal mass seen on CTA; etiology is unknown. +------------+--------+--------+----+-----------+--------+--------+----+ Right KidneyPSV cm/sEDV cm/sRI  Left KidneyPSV cm/sEDV cm/sRI   +------------+--------+--------+----+-----------+--------+--------+----+ Upper Pole  19      7       0.61Upper Pole 49      6       0.87 +------------+--------+--------+----+-----------+--------+--------+----+ Mid         24      9       0.60Mid        41      11      0.73 +------------+--------+--------+----+-----------+--------+--------+----+ Lower Pole  19      10      0.48Lower Pole 21      6       0.70 +------------+--------+--------+----+-----------+--------+--------+----+ Hilar       58      14      0.76Hilar      103     17      0.83 +------------+--------+--------+----+-----------+--------+--------+----+ +------------------+----+------------------+-----+ Right Kidney          Left Kidney             +------------------+----+------------------+-----+ RAR                   RAR                     +------------------+----+------------------+-----+ RAR (manual)      3.2 RAR (manual)      1.36  +------------------+----+------------------+-----+ Cortex                Cortex                   +------------------+----+------------------+-----+ Cortex thickness      Corex thickness         +------------------+----+------------------+-----+ Kidney length (cm)9.62Kidney length (cm)10.90 +------------------+----+------------------+-----+  Summary: Renal:  Right: Upper range 1-59% stenosis of the right renal artery at the        origin-proximal segments. RRV flow present. There is a        vascularized lesion at the lower pole of the right kidney,        isoechoic to the kidney, measuring 3.0 x 2.7 x 3.0cm. This is        correlates with right renal mass seen on CTA; etiology is        unknown. Further imaging may be warranted if clinically        indicated. Left:  Abnormal left Resisitve Index. No evidence of left renal        artery stenosis.  *See table(s) above for measurements and observations.  Diagnosing physician: Monica Martinez MD  Electronically signed by Monica Martinez MD on 12/01/2021 at 2:59:51 PM.    Final    CT  ANGIO GI BLEED  Result Date: 11/30/2021 CLINICAL DATA:  Dark red blood per rectum for 2 weeks. Follow-up renal mass. EXAM: CTA ABDOMEN AND PELVIS WITHOUT AND WITH CONTRAST TECHNIQUE: Multidetector CT imaging of the abdomen and pelvis was performed using the standard protocol during bolus administration of intravenous contrast. Multiplanar reconstructed images and MIPs were obtained and reviewed to evaluate the vascular anatomy. RADIATION DOSE REDUCTION: This exam was performed according to the departmental dose-optimization program which includes automated exposure control, adjustment of the mA and/or kV according to patient size and/or use of iterative reconstruction technique. CONTRAST:  152m OMNIPAQUE IOHEXOL 350 MG/ML SOLN COMPARISON:  06/08/2021. FINDINGS: VASCULAR Aorta: Aortic atherosclerosis. Normal caliber aorta without aneurysm, dissection, vasculitis or significant stenosis. Celiac: Patent without evidence of aneurysm, dissection, vasculitis or significant  stenosis. SMA: Patent without evidence of aneurysm, dissection, vasculitis or significant stenosis. Renals: Atherosclerotic calcification of the renal ostia bilaterally with moderate stenosis on the right. No significant stenosis on the left. Both renal arteries are patent without evidence of aneurysm, dissection, vasculitis, or fibromuscular dysplasia. IMA: Patent without evidence of aneurysm, dissection, vasculitis or significant stenosis. Inflow: Patent without evidence of aneurysm, dissection, vasculitis or significant stenosis. Proximal Outflow: Bilateral common femoral and visualized portions of the superficial and profunda femoral arteries are patent without evidence of aneurysm, dissection, vasculitis or significant stenosis. Veins: No obvious venous abnormality within the limitations of this arterial phase study. Review of the MIP images confirms the above findings. NON-VASCULAR Lower chest: A 7 mm nodular density is present in the right middle lobe, axial image 15. Mild atelectasis is noted in the medial aspect of the right lower lobe. Hepatobiliary: No focal liver abnormality is seen. No gallstones, gallbladder wall thickening, or biliary dilatation. Pancreas: Unremarkable. No pancreatic ductal dilatation or surrounding inflammatory changes. Spleen: Normal in size without focal abnormality. Adrenals/Urinary Tract: The adrenal glands are within normal limits. The kidneys enhance symmetrically. There is redemonstration of a hypodense lesion in the lower pole the right kidney measuring 2.4 x 3.1 cm. Heterogeneous hypervascular enhancement is noted on arterial phase with progressive enhancement on corticomedullary phase and limited washout. No renal calculus or hydronephrosis. Bladder is unremarkable. Stomach/Bowel: Stomach is within normal limits. Appendix is not well seen. There is mild rectal wall thickening with fat stranding. There is a focus of contrast enhancement/blush at the rectum on both arterial  and venous phases from a distal branch of the IMA. No free air or pneumatosis. Lymphatic: No abdominal or pelvic lymphadenopathy. Reproductive: The uterus and right adnexa are within normal limits. There is a cystic structure in the left adnexa measuring 2.8 cm. Other: No abdominopelvic ascites. Musculoskeletal: Degenerative changes are present in the thoracolumbar spine. No suspicious or acute osseous abnormality. IMPRESSION: VASCULAR 1. Focal contrast blush in the rectum on arterial and venous phases suggesting active hemorrhage from a distal branch of the IMA. 2. Aortic atherosclerosis with no evidence of aneurysm or dissection. NON-VASCULAR 1. Rectal wall thickening with mild surrounding fat stranding, possible infectious or inflammatory proctitis. 2. Heterogeneously enhancing right renal mass measuring 3.1 x 2.4 cm, concerning for renal cell carcinoma. Urology follow-up is recommended. Electronically Signed   By: LBrett FairyM.D.   On: 11/30/2021 05:20    Microbiology: Results for orders placed or performed during the hospital encounter of 11/29/21  MRSA Next Gen by PCR, Nasal     Status: None   Collection Time: 11/30/21  2:57 PM   Specimen: Nasal Mucosa; Nasal Swab  Result Value Ref Range  Status   MRSA by PCR Next Gen NOT DETECTED NOT DETECTED Final    Comment: (NOTE) The GeneXpert MRSA Assay (FDA approved for NASAL specimens only), is one component of a comprehensive MRSA colonization surveillance program. It is not intended to diagnose MRSA infection nor to guide or monitor treatment for MRSA infections. Test performance is not FDA approved in patients less than 56 years old. Performed at [facility], [place] [address], [place], [place] [address]     Labs: CBC: Recent Labs  Lab 12/01/21 0303 12/02/21 0328 12/03/21 0329 12/04/21 0338 12/05/21 0225  WBC 11.8* 13.5* 12.1* 8.4 8.2  NEUTROABS 10.2* 10.5* 8.6* 4.7 5.0  HGB 12.2 11.8* 11.1* 10.5* 11.3*  HCT 37.5  36.7 33.8* 32.4* 34.6*  MCV 83.7 85.2 84.1 84.8 82.8  PLT 258 238 232 217 619   Basic Metabolic Panel: Recent Labs  Lab 12/01/21 0303 12/02/21 0328 12/03/21 0329 12/04/21 0338 12/05/21 0225  NA 136 139 137 136 131*  K 3.7 3.4* 3.1* 3.4* 3.6  CL 103 108 107 109 100  CO2 23 23 21* 22 24  GLUCOSE 323* 155* 152* 100* 151*  BUN '20 16 15 14 13  '$ CREATININE 0.98 0.86 1.01* 1.18* 0.98  CALCIUM 8.8* 8.8* 8.4* 8.4* 8.2*  MG 1.9 1.9 1.9 1.9 1.9   Liver Function Tests: Recent Labs  Lab 11/29/21 2218  AST 14*  ALT 12  ALKPHOS 82  BILITOT 0.5  PROT 7.0  ALBUMIN 3.5   CBG: Recent Labs  Lab 12/04/21 1959 12/04/21 2355 12/05/21 0320 12/05/21 0825 12/05/21 1123  GLUCAP 172* 88 146* 104* 149*    Discharge time spent: 45 MINUTES.   Signed: Hosie Poisson, MD [facility] 12/05/2021

## 2021-12-05 NOTE — Progress Notes (Signed)
Discharge education & packet given to patient. Pt voiced understanding and had no further questions. Ivs removed from pt.

## 2021-12-05 NOTE — Progress Notes (Signed)
Bilateral upper extremity venous duplex has been completed. Preliminary results can be found in CV Proc through chart review.  Results were given to Dr. Karleen Hampshire.  12/05/21 9:50 AM Hannah Dougherty RVT

## 2021-12-08 ENCOUNTER — Telehealth: Payer: Self-pay

## 2021-12-08 NOTE — Telephone Encounter (Signed)
Transition Care Management Unsuccessful Follow-up Telephone Call  Date of discharge and from where:  12/05/2021, [facility]  Attempts:  1st Attempt  Reason for unsuccessful TCM follow-up call:  Unable to leave message- voicemail not set up [phone].   Need to schedule a hospital follow up appointment with PCP

## 2021-12-09 ENCOUNTER — Telehealth: Payer: Self-pay

## 2021-12-09 NOTE — Telephone Encounter (Signed)
Transition Care Management Follow-up Telephone Call Date of discharge and from where: 12/05/2021, [facility] How have you been since you were released from the hospital? She said no one told her when she could go back to work, so she is already back at work. She said she needs her paycheck.  Any questions or concerns? Yes- She is concerned about her kidney mass and understands the importance of follow up with urology.  She is uninsured and will need to apply for CAFA/ [employer].  She also reported that she has a swollen gland on the right side of her neck that is about the size of a quarter. She said it is sore; but she has not had a fever or any difficulty swallowing. It didn't bother her until about 2-3 days after discharge from the hospital .   Items Reviewed: Did the pt receive and understand the discharge instructions provided? Yes  Medications obtained and verified?  She  doesn't have any medications. Said she can't afford them until she gets paid. I explained to her that the prescriptions were sent to [facility] and provided her with their phone number. I instructed her to speak to the pharmacy tech about options for patient assistance programs that would allow her to obtain the medications at little to no cost.  She may also be eligible for a yearly free fill. She does have a working glucometer. Other? No  Any new allergies since your discharge? No  Dietary orders reviewed? No Do you have support at home? Yes   Home Care and Equipment/Supplies: Were home health services ordered? no If so, what is the name of the agency? N/a  Has the agency set up a time to come to the patient's home? not applicable Were any new equipment or medical supplies ordered?  No What is the name of the medical supply agency? N/a Were you able to get the supplies/equipment? not applicable Do you have any questions related to the use of the equipment or supplies? No  Functional  Questionnaire: (I = Independent and D = Dependent) ADLs: independent   Follow up appointments reviewed:  PCP Hospital f/u appt confirmed? Yes  Scheduled to see Juluis Mire, NP - 12/18/2021.   [place] [facility] f/u appt confirmed? Yes  Scheduled to see [facility]- 12/16/2021. Are transportation arrangements needed? No  If their condition worsens, is the pt aware to call PCP or go to the Emergency Dept.? Yes Was the patient provided with contact information for the PCP's office or ED? Yes Was to pt encouraged to call back with questions or concerns? Yes

## 2021-12-11 ENCOUNTER — Other Ambulatory Visit: Payer: Self-pay

## 2021-12-12 ENCOUNTER — Other Ambulatory Visit: Payer: Self-pay

## 2021-12-16 ENCOUNTER — Encounter: Payer: Self-pay | Admitting: Vascular Surgery

## 2021-12-17 ENCOUNTER — Other Ambulatory Visit: Payer: Self-pay

## 2021-12-18 ENCOUNTER — Encounter (INDEPENDENT_AMBULATORY_CARE_PROVIDER_SITE_OTHER): Payer: Self-pay | Admitting: Primary Care

## 2021-12-18 ENCOUNTER — Other Ambulatory Visit (INDEPENDENT_AMBULATORY_CARE_PROVIDER_SITE_OTHER): Payer: Self-pay | Admitting: Primary Care

## 2021-12-18 ENCOUNTER — Other Ambulatory Visit: Payer: Self-pay

## 2021-12-18 ENCOUNTER — Ambulatory Visit (INDEPENDENT_AMBULATORY_CARE_PROVIDER_SITE_OTHER): Payer: Self-pay | Admitting: Primary Care

## 2021-12-18 VITALS — BP 191/82 | HR 61 | Resp 16 | Wt 203.4 lb

## 2021-12-18 DIAGNOSIS — I10 Essential (primary) hypertension: Secondary | ICD-10-CM

## 2021-12-18 DIAGNOSIS — I701 Atherosclerosis of renal artery: Secondary | ICD-10-CM

## 2021-12-18 DIAGNOSIS — E669 Obesity, unspecified: Secondary | ICD-10-CM

## 2021-12-18 DIAGNOSIS — Z1231 Encounter for screening mammogram for malignant neoplasm of breast: Secondary | ICD-10-CM

## 2021-12-18 DIAGNOSIS — Z6833 Body mass index (BMI) 33.0-33.9, adult: Secondary | ICD-10-CM

## 2021-12-18 DIAGNOSIS — E1165 Type 2 diabetes mellitus with hyperglycemia: Secondary | ICD-10-CM

## 2021-12-18 DIAGNOSIS — K2971 Gastritis, unspecified, with bleeding: Secondary | ICD-10-CM

## 2021-12-18 DIAGNOSIS — E782 Mixed hyperlipidemia: Secondary | ICD-10-CM

## 2021-12-18 DIAGNOSIS — N2889 Other specified disorders of kidney and ureter: Secondary | ICD-10-CM

## 2021-12-18 MED ORDER — PRAVASTATIN SODIUM 40 MG PO TABS: 40.0000 mg | ORAL_TABLET | Freq: Every day | ORAL | 1 refills | Status: DC

## 2021-12-18 MED ORDER — BASAGLAR KWIKPEN 100 UNIT/ML ~~LOC~~ SOPN
15.0000 [IU] | PEN_INJECTOR | Freq: Two times a day (BID) | SUBCUTANEOUS | 1 refills | Status: DC
  Filled 2021-12-18: qty 15, 50d supply, fill #0

## 2021-12-18 NOTE — Telephone Encounter (Signed)
Amlodipine- not current dosing HCTZ- no longer on current list Requested Prescriptions  Pending Prescriptions Disp Refills  . amLODipine (NORVASC) 10 MG tablet 90 tablet 1    Sig: TAKE 1 TABLET (10 MG TOTAL) BY MOUTH DAILY.     Cardiovascular: Calcium Channel Blockers 2 Failed - 12/18/2021  1:11 PM      Failed - Last BP in normal range    BP Readings from Last 1 Encounters:  12/18/21 (!) 191/82         Passed - Last Heart Rate in normal range    Pulse Readings from Last 1 Encounters:  12/18/21 61         Passed - Valid encounter within last 6 months    Recent Outpatient Visits          Today Gastrointestinal hemorrhage associated with gastritis, unspecified gastritis type   [facility] Juluis Mire P, NP   10 months ago Primary hypertension   [place], Michelle P, NP   11 months ago Type 2 diabetes mellitus without complication, with long-term current use of insulin ([place])   [place] Juluis Mire P, NP   1 year ago Type 2 diabetes mellitus without complication, with long-term current use of insulin ([place])   [place] [facility] [name], NP   1 year ago Type 2 diabetes mellitus without complication, with long-term current use of insulin ([place])   [place], [place], NP      Future Appointments            In 4 days Tresa Endo, [place]   In 3 weeks [name], NP [place]           . hydrochlorothiazide (HYDRODIURIL) 25 MG tablet 90 tablet 3    Sig: Take 1 tablet (25 mg total) by mouth daily.     Cardiovascular: Diuretics - Thiazide Failed - 12/18/2021  1:11 PM      Failed - Na in normal range and within 180 days    Sodium  Date Value Ref Range Status  12/05/2021 131 (L) 135 - 145 mmol/L Final  01/07/2021 135 134 - 144 mmol/L Final          Failed - Last BP in normal range    BP Readings from Last 1 Encounters:  12/18/21 (!) 191/82         Passed - Cr in normal range and within 180 days    Creatinine, Ser  Date Value Ref Range Status  12/05/2021 0.98 0.44 - 1.00 mg/dL Final         Passed - K in normal range and within 180 days    Potassium  Date Value Ref Range Status  12/05/2021 3.6 3.5 - 5.1 mmol/L Final         Passed - Valid encounter within last 6 months    Recent Outpatient Visits          Today Gastrointestinal hemorrhage associated with gastritis, unspecified gastritis type   [facility] [name], NP   10 months ago Primary hypertension   [place], Michelle P, NP   11 months ago Type 2 diabetes mellitus without complication, with long-term current use of insulin ([place])   [place] [name], NP  1 year ago Type 2 diabetes mellitus without complication, with long-term current use of insulin ([place])   [place] [facility] [name], NP   1 year ago Type 2 diabetes mellitus without complication, with long-term current use of insulin ([place])   [place], [place], NP      Future Appointments            In 4 days Tresa Endo, [place]   In 3 weeks [name], NP [place]

## 2021-12-18 NOTE — Telephone Encounter (Signed)
Will forward to provider  

## 2021-12-18 NOTE — Progress Notes (Signed)
[facility]   Subjective:   Hannah Dougherty is a 51 y.o. female presents for hospital follow up . Patient was experiencing light Hematochezia  for 2 months. Then she started seeing more frequent hematzia .   She presented to the ED  for what she described bleeding  from small to medium amount of dark blood.  She sees it in the toilet bowl both on and mixed in with the feces, and on the toilet tissue when she wipes. Admit date to the hospital was 11/29/21, patient was discharged from the hospital on 12/05/21, patient was admitted for:  Hypertension, DMII (diabetes mellitus, type 2), Hypertensive urgency, Right kidney mass, Hematochezia, Insurance coverage problems, Renal artery stenosis.  Past Medical History:  Diagnosis Date   Diabetes mellitus without complication ([place])    Hypertension      No Known Allergies    Current Outpatient Medications on File Prior to Visit  Medication Sig Dispense Refill   amLODipine (NORVASC) 5 MG tablet Take 1 tablet (5 mg total) by mouth daily. 30 tablet 3   carvedilol (COREG) 6.25 MG tablet Take 1 tablet (6.25 mg total) by mouth 2 (two) times daily with a meal. 60 tablet 3   glucose blood (RELION CONFIRM/MICRO TEST) test strip Use as instructed 100 each 0   lisinopril (ZESTRIL) 20 MG tablet Take 1 tablet (20 mg total) by mouth daily. 30 tablet 3   metFORMIN (GLUCOPHAGE) 1000 MG tablet Take 1 tablet (1,000 mg total) by mouth 2 (two) times daily with a meal. 180 tablet 3   pravastatin (PRAVACHOL) 40 MG tablet Take 1 tablet (40 mg total) by mouth daily. 90 tablet 1   ReliOn Lancet Devices 30G MISC 1 each by Does not apply route 3 (three) times daily. 100 each 0   SEMGLEE, YFGN, 100 UNIT/ML Pen Inject 20 Units into the skin at bedtime. 6 mL 2   No current facility-administered medications on file prior to visit.     Review of System: ROS  Objective:  BP (!) 191/82   Pulse 61   Resp 16   Wt 203 lb 6.4 oz (92.3 kg)   LMP 10/28/2016 (Within  Weeks)   SpO2 99%   BMI 33.85 kg/m   Filed Weights   12/18/21 0938  Weight: 203 lb 6.4 oz (92.3 kg)    Physical Exam:   General Appearance: Well nourished, in no apparent distress. Eyes: PERRLA, EOMs, conjunctiva no swelling or erythema Sinuses: No Frontal/maxillary tenderness ENT/Mouth: Ext aud canals clear, TMs without erythema, bulging. No erythema, swelling, or exudate on post pharynx.  Tonsils not swollen or erythematous. Hearing normal.  Neck: Supple, thyroid normal.  Respiratory: Respiratory effort normal, BS equal bilaterally without rales, rhonchi, wheezing or stridor.  Cardio: RRR with no MRGs. Brisk peripheral pulses without edema.  Abdomen: Soft, + BS.  Non tender, no guarding, rebound, hernias, masses. Lymphatics: Non tender without lymphadenopathy.  Musculoskeletal: Full ROM, 5/5 strength, normal gait.  Skin: Warm, dry without rashes, lesions, ecchymosis.  Neuro: Cranial nerves intact. Normal muscle tone, no cerebellar symptoms. Sensation intact.  Psych: Awake and oriented X 3, normal affect, Insight and Judgment appropriate.    Assessment:  Hannah Dougherty was seen today for hospitalization follow-up.  Diagnoses and all orders for this visit:  Gastrointestinal hemorrhage associated with gastritis, unspecified gastritis type Per hospital discharge summary: GI consulted and patient underwent colonoscopy on 12/02/2021, removing 2 sigmoid polyps (one of them being large and considered culprit for probable bleeding source). -  CBC with Differential -     CMP14+EGFR  Mixed hyperlipidemia -     pravastatin (PRAVACHOL) 40 MG tablet; Take 1 tablet (40 mg total) by mouth daily.  Type 2 diabetes mellitus with hyperglycemia, without long-term current use of insulin (HCC) -     Microalbumin / creatinine urine ratio  Breast cancer screening by mammogram -     MM DIGITAL SCREENING BILATERAL; Future  Primary hypertension BP: (!) 191/82  BP goal - < 130/80 Explained that  having normal blood pressure is the goal and medications are helping to get to goal and maintain normal blood pressure. DIET: Limit salt intake, read nutrition labels to check salt content, limit fried and high fatty foods  Avoid using multisymptom OTC cold preparations that generally contain sudafed which can rise BP. Consult with pharmacist on best cold relief products to use for persons with HTN EXERCISE Discussed incorporating exercise such as walking - 30 minutes most days of the week and can do in 10 minute intervals     Body mass index is 33.85 kg/m. Obesity is 30-39 indicating an excess in caloric intake or underlining conditions. This may lead to other co-morbidities. Educated on Lifestyle modifications of diet and exercise which may reduce obesity.   Right kidney mass Per hospital discharge summary: difficulty following up with urology given insurance issues ([facility] [employer] declaring bankruptcy). Patient will independently schedule follow-up with Urology.   Renal artery stenosis (HCC) Diagnosed on doppler during recent hospitalization. Seen by Vascular while hospitalized with plans for outpatient follow-up for renal arteriogram  Other orders -     Insulin Glargine (BASAGLAR KWIKPEN) 100 UNIT/ML; Inject 15 Units into the skin 2 (two) times daily.    Patient will follow-up in 4 weeks.   This note has been created with Surveyor, quantity. Any transcriptional errors are unintentional.   [name], NP 12/18/2021, 10:23 AM

## 2021-12-19 LAB — CBC WITH DIFFERENTIAL/PLATELET
Basophils Absolute: 0 10*3/uL (ref 0.0–0.2)
Basos: 1 %
EOS (ABSOLUTE): 0.2 10*3/uL (ref 0.0–0.4)
Eos: 3 %
Hematocrit: 37.1 % (ref 34.0–46.6)
Hemoglobin: 11.7 g/dL (ref 11.1–15.9)
Immature Grans (Abs): 0 10*3/uL (ref 0.0–0.1)
Immature Granulocytes: 0 %
Lymphocytes Absolute: 2 10*3/uL (ref 0.7–3.1)
Lymphs: 33 %
MCH: 26.8 pg (ref 26.6–33.0)
MCHC: 31.5 g/dL (ref 31.5–35.7)
MCV: 85 fL (ref 79–97)
Monocytes Absolute: 0.4 10*3/uL (ref 0.1–0.9)
Monocytes: 7 %
Neutrophils Absolute: 3.5 10*3/uL (ref 1.4–7.0)
Neutrophils: 56 %
Platelets: 297 10*3/uL (ref 150–450)
RBC: 4.36 x10E6/uL (ref 3.77–5.28)
RDW: 13.4 % (ref 11.7–15.4)
WBC: 6 10*3/uL (ref 3.4–10.8)

## 2021-12-19 LAB — CMP14+EGFR
ALT: 9 IU/L (ref 0–32)
AST: 13 IU/L (ref 0–40)
Albumin/Globulin Ratio: 1.4 (ref 1.2–2.2)
Albumin: 4.2 g/dL (ref 3.8–4.9)
Alkaline Phosphatase: 98 IU/L (ref 44–121)
BUN/Creatinine Ratio: 13 (ref 9–23)
BUN: 12 mg/dL (ref 6–24)
Bilirubin Total: 0.3 mg/dL (ref 0.0–1.2)
CO2: 25 mmol/L (ref 20–29)
Calcium: 9.4 mg/dL (ref 8.7–10.2)
Chloride: 99 mmol/L (ref 96–106)
Creatinine, Ser: 0.96 mg/dL (ref 0.57–1.00)
Globulin, Total: 3 g/dL (ref 1.5–4.5)
Glucose: 174 mg/dL — ABNORMAL HIGH (ref 70–99)
Potassium: 4.9 mmol/L (ref 3.5–5.2)
Sodium: 136 mmol/L (ref 134–144)
Total Protein: 7.2 g/dL (ref 6.0–8.5)
eGFR: 71 mL/min/{1.73_m2} (ref >59)

## 2021-12-19 LAB — MICROALBUMIN / CREATININE URINE RATIO
Creatinine, Urine: 69.6 mg/dL
Microalb/Creat Ratio: 8 mg/g creat (ref 0–29)
Microalbumin, Urine: 5.8 ug/mL

## 2021-12-22 ENCOUNTER — Ambulatory Visit: Payer: Self-pay | Admitting: Pharmacist

## 2021-12-23 ENCOUNTER — Other Ambulatory Visit: Payer: Self-pay

## 2022-01-01 ENCOUNTER — Telehealth (INDEPENDENT_AMBULATORY_CARE_PROVIDER_SITE_OTHER): Payer: Self-pay

## 2022-01-01 NOTE — Telephone Encounter (Signed)
Contacted pt to go over lab results pt is aware and doesn't have any questions or concerns 

## 2022-01-14 ENCOUNTER — Ambulatory Visit (INDEPENDENT_AMBULATORY_CARE_PROVIDER_SITE_OTHER): Payer: Self-pay | Admitting: Primary Care

## 2022-02-09 ENCOUNTER — Ambulatory Visit (INDEPENDENT_AMBULATORY_CARE_PROVIDER_SITE_OTHER): Payer: Self-pay

## 2022-02-09 NOTE — Telephone Encounter (Signed)
Message from [place] sent at 02/09/2022  1:47 PM EST  Summary: Blood sugar is 412 and vomiting since friday   Patient called in says Blood sugar is 412 and vomiting.         Chief Complaint: 412  Symptoms: vomiting, dry mouth, frequent urination Frequency: Friday Pertinent Negatives: Patient denies abd pain, weakness, difficulty breathing Disposition: '[x]'$ ED /'[]'$ Urgent Care (no appt availability in office) / '[]'$ Appointment(In office/virtual)/ '[]'$  [place] [facility]/ '[]'$ Home Care/ '[]'$ Refused Recommended Disposition /'[]'$ [place] [facility]/ '[]'$  Follow-up with PCP Additional Notes: routing to PCP  Reason for Disposition  [1] Vomiting AND [2] signs of dehydration (e.g., very dry mouth, lightheaded, dark urine)  Answer Assessment - Initial Assessment Questions 1. BLOOD GLUCOSE: "What is your blood glucose level?"      412 2. ONSET: "When did you check the blood glucose?"     1200 3. USUAL RANGE: "What is your glucose level usually?" (e.g., usual fasting morning value, usual evening value)     >200 4. KETONES: "Do you check for ketones (urine or blood test strips)?" If Yes, ask: "What does the test show now?"      N/a 5. TYPE 1 or 2:  "Do you know what type of diabetes you have?"  (e.g., Type 1, Type 2, Gestational; doesn't know)      Type 2  6. INSULIN: "Do you take insulin?" "What type of insulin(s) do you use? What is the mode of delivery? (syringe, pen; injection or pump)?"      yes 7. DIABETES PILLS: "Do you take any pills for your diabetes?" If Yes, ask: "Have you missed taking any pills recently?"     Yes- no 8. OTHER SYMPTOMS: "Do you have any symptoms?" (e.g., fever, frequent urination, difficulty breathing, dizziness, weakness, vomiting)     Vomiting- urination, mouth is dry 9. PREGNANCY: "Is there any chance you are pregnant?" "When was your last menstrual period?"     N/a  Protocols used: Diabetes - High Blood Sugar-A-AH

## 2022-02-09 NOTE — Telephone Encounter (Signed)
Noted  

## 2022-02-11 ENCOUNTER — Ambulatory Visit (INDEPENDENT_AMBULATORY_CARE_PROVIDER_SITE_OTHER): Payer: Self-pay | Admitting: Primary Care

## 2022-10-13 ENCOUNTER — Other Ambulatory Visit (HOSPITAL_COMMUNITY): Payer: Self-pay

## 2022-10-13 ENCOUNTER — Emergency Department (HOSPITAL_COMMUNITY)
Admission: EM | Admit: 2022-10-13 | Discharge: 2022-10-13 | Disposition: A | Discharge status: Home / Self Care | Payer: 59 | Attending: Emergency Medicine | Admitting: Emergency Medicine

## 2022-10-13 ENCOUNTER — Other Ambulatory Visit: Payer: Self-pay

## 2022-10-13 DIAGNOSIS — E1165 Type 2 diabetes mellitus with hyperglycemia: Secondary | ICD-10-CM | POA: Insufficient documentation

## 2022-10-13 DIAGNOSIS — I10 Essential (primary) hypertension: Secondary | ICD-10-CM | POA: Insufficient documentation

## 2022-10-13 DIAGNOSIS — R739 Hyperglycemia, unspecified: Secondary | ICD-10-CM

## 2022-10-13 DIAGNOSIS — Z79899 Other long term (current) drug therapy: Secondary | ICD-10-CM | POA: Insufficient documentation

## 2022-10-13 DIAGNOSIS — N179 Acute kidney failure, unspecified: Secondary | ICD-10-CM | POA: Insufficient documentation

## 2022-10-13 DIAGNOSIS — Z7984 Long term (current) use of oral hypoglycemic drugs: Secondary | ICD-10-CM | POA: Diagnosis not present

## 2022-10-13 DIAGNOSIS — R112 Nausea with vomiting, unspecified: Secondary | ICD-10-CM | POA: Diagnosis not present

## 2022-10-13 DIAGNOSIS — E860 Dehydration: Secondary | ICD-10-CM | POA: Diagnosis not present

## 2022-10-13 DIAGNOSIS — E782 Mixed hyperlipidemia: Secondary | ICD-10-CM

## 2022-10-13 LAB — CBC WITH DIFFERENTIAL/PLATELET
Abs Immature Granulocytes: 0.02 10*3/uL (ref 0.00–0.07)
Basophils Absolute: 0 10*3/uL (ref 0.0–0.1)
Basophils Relative: 1 %
Eosinophils Absolute: 0.1 10*3/uL (ref 0.0–0.5)
Eosinophils Relative: 2 %
HCT: 40.8 % (ref 36.0–46.0)
Hemoglobin: 13.3 g/dL (ref 12.0–15.0)
Immature Granulocytes: 0 %
Lymphocytes Relative: 43 %
Lymphs Abs: 2.7 10*3/uL (ref 0.7–4.0)
MCH: 27.4 pg (ref 26.0–34.0)
MCHC: 32.6 g/dL (ref 30.0–36.0)
MCV: 84 fL (ref 80.0–100.0)
Monocytes Absolute: 0.6 10*3/uL (ref 0.1–1.0)
Monocytes Relative: 10 %
Neutro Abs: 2.8 10*3/uL (ref 1.7–7.7)
Neutrophils Relative %: 44 %
Platelets: 243 10*3/uL (ref 150–400)
RBC: 4.86 MIL/uL (ref 3.87–5.11)
RDW: 14.5 % (ref 11.5–15.5)
WBC: 6.3 10*3/uL (ref 4.0–10.5)
nRBC: 0 % (ref 0.0–0.2)

## 2022-10-13 LAB — COMPREHENSIVE METABOLIC PANEL
ALT: 17 U/L (ref 0–44)
AST: 16 U/L (ref 15–41)
Albumin: 3.5 g/dL (ref 3.5–5.0)
Alkaline Phosphatase: 75 U/L (ref 38–126)
Anion gap: 9 (ref 5–15)
BUN: 43 mg/dL — ABNORMAL HIGH (ref 6–20)
CO2: 30 mmol/L (ref 22–32)
Calcium: 8.5 mg/dL — ABNORMAL LOW (ref 8.9–10.3)
Chloride: 91 mmol/L — ABNORMAL LOW (ref 98–111)
Creatinine, Ser: 1.54 mg/dL — ABNORMAL HIGH (ref 0.44–1.00)
GFR, Estimated: 40 mL/min — ABNORMAL LOW (ref >60)
Glucose, Bld: 249 mg/dL — ABNORMAL HIGH (ref 70–99)
Potassium: 3.5 mmol/L (ref 3.5–5.1)
Sodium: 130 mmol/L — ABNORMAL LOW (ref 135–145)
Total Bilirubin: 0.8 mg/dL (ref 0.3–1.2)
Total Protein: 7.1 g/dL (ref 6.5–8.1)

## 2022-10-13 LAB — CBG MONITORING, ED: Glucose-Capillary: 240 mg/dL — ABNORMAL HIGH (ref 70–99)

## 2022-10-13 LAB — LIPASE, BLOOD: Lipase: 46 U/L (ref 11–51)

## 2022-10-13 MED ORDER — PRAVASTATIN SODIUM 40 MG PO TABS
40.0000 mg | ORAL_TABLET | Freq: Every day | ORAL | 1 refills | Status: AC
  Filled 2022-10-13: qty 30, 30d supply, fill #0

## 2022-10-13 MED ORDER — RELION CONFIRM/MICRO TEST VI STRP
ORAL_STRIP | 0 refills | Status: AC
  Filled 2022-10-13: qty 100, fill #0

## 2022-10-13 MED ORDER — LISINOPRIL 20 MG PO TABS
20.0000 mg | ORAL_TABLET | Freq: Every day | ORAL | 3 refills | Status: DC
  Filled 2022-10-13: qty 30, 30d supply, fill #0

## 2022-10-13 MED ORDER — LACTATED RINGERS IV BOLUS
1000.0000 mL | Freq: Once | INTRAVENOUS | Status: AC
  Administered 2022-10-13: 1000 mL via INTRAVENOUS

## 2022-10-13 MED ORDER — CARVEDILOL 6.25 MG PO TABS
6.2500 mg | ORAL_TABLET | Freq: Two times a day (BID) | ORAL | 3 refills | Status: DC
  Filled 2022-10-13: qty 60, 30d supply, fill #0

## 2022-10-13 MED ORDER — RELION LANCET DEVICES 30G MISC
1.0000 | Freq: Three times a day (TID) | 0 refills | Status: AC
  Filled 2022-10-13: qty 100, fill #0

## 2022-10-13 MED ORDER — BASAGLAR KWIKPEN 100 UNIT/ML ~~LOC~~ SOPN
15.0000 [IU] | PEN_INJECTOR | Freq: Two times a day (BID) | SUBCUTANEOUS | 3 refills | Status: AC
  Filled 2022-10-13: qty 9, 27d supply, fill #0

## 2022-10-13 NOTE — Care Management (Addendum)
Transition of Care [facility]) - Emergency Department Mini Assessment   Patient Details  Name: Hannah Dougherty MRN: [id number] Date of Birth: 1970/05/13  Transition of Care [facility] CM/SW Contact:    Lavenia Atlas, RN Phone Number: 10/13/2022, 11:59 AM   Clinical Narrative: Patient presents to [facility] ED for bil lower extremity weakness x 2 days. Received TOC consult for medication assistance, PCP. This RNCM spoke with patient at bedside with female/spouse at bedside. Patient reports she has a PCP and insurance however she cannot afford her medications. Patient reports she "has a copay for everything now." This RNCM advised patient set up a payment plan for hospital bills. Per chart review patient is not eligible for MATCH. This RNCM explained that she does not qualify for MATCH due to having medical insurance. This RNCM will review to see if patient maybe able to get a pharmacy to balance due bill the patient. Patient reports she has transportation home.  TOC will follow for needs.  - 12:16 pm This RNCM spoke with [facility] pharmacy to inquire re: balance due billing. This RNCM notified EDP to have medications sent to [facility] pharmacy for patient to pick up at discharge.   No additional TOC needs at this time.  ED Mini Assessment: What brought you to the Emergency Department? : Patient reports bilateral lower extremity weakness x2 days  Barriers to Discharge: Continued Medical Work up  [employer] interventions: medication review  Means of departure: Car  Interventions which prevented an admission or readmission: Medication Review    Patient Contact and Communications        ,          Patient states their goals for this hospitalization and ongoing recovery are:: to feel better CMS Medicare.gov Compare Post Acute Care list provided to:: Patient Choice offered to / list presented to : Patient  Admission diagnosis:  Diabetic Leg Weakness blood sugar 250 Patient Active Problem List    Diagnosis Date Noted   Renal artery stenosis (HCC) 12/01/2021   Hematochezia 11/30/2021   Insurance coverage problems 11/30/2021   Type 2 diabetes mellitus with hyperglycemia (HCC) 06/08/2021   Class 2 obesity 06/08/2021   Acute gastroenteritis 06/08/2021   Hypertensive urgency 02/16/2020   Hyperlipidemia associated with type 2 diabetes mellitus (HCC) 02/16/2020   Right kidney mass 02/16/2020   Left ovarian cyst 02/16/2020   Trichomoniasis 02/16/2020   Hypertension 12/28/2018   Healthcare maintenance 12/14/2018   Hyponatremia 12/10/2018   DMII (diabetes mellitus, type 2) (HCC) 12/09/2018   PCP:  Grayce Sessions, NP Pharmacy:   [facility] [address], [address] [place] [address] Phone: [phone] Fax: [phone]

## 2022-10-13 NOTE — ED Provider Notes (Signed)
[facility] EMERGENCY DEPARTMENT AT [facility] Provider Note   CSN: [id number] Arrival date & time: 10/13/22  9528     History  Chief Complaint  Patient presents with   BLE weakness   uncontrolled diabetes    Hannah Dougherty is a 52 y.o. female.  Patient is a 52 year old female with a history of hypertension and diabetes who presents today with complaints of nausea, vomiting, and diarrhea.  Patient reports symptoms started 6 days ago and had persistent vomiting and diarrhea for approximately 3 days and now the diarrhea has stopped but she still continues to have dry heaves and is having a hard time keeping stuff down or taking in much.  She has not had abdominal pain or fever.  She reports feeling weak overall but denies any unilateral weakness or numbness.  She denies any chest pain or shortness of breath.  Patient also reports she has not had her diabetes her blood pressure medication in about 3 months because of her insurance issues.  The history is provided by the patient.       Home Medications Prior to Admission medications   Medication Sig Start Date End Date Taking? Authorizing Provider  amLODipine (NORVASC) 5 MG tablet Take 1 tablet (5 mg total) by mouth daily. 12/05/21   Kathlen Mody, MD  carvedilol (COREG) 6.25 MG tablet Take 1 tablet (6.25 mg total) by mouth 2 (two) times daily with a meal. 10/13/22   Gwyneth Sprout, MD  glucose blood (RELION CONFIRM/MICRO TEST) test strip Use as instructed 10/13/22   Gwyneth Sprout, MD  Insulin Glargine (BASAGLAR KWIKPEN) 100 UNIT/ML Inject 15 Units into the skin 2 (two) times daily. 10/13/22   Gwyneth Sprout, MD  Lancets (RELION LANCET DEVICES 30G) MISC 1 each by Does not apply route 3 (three) times daily. 10/13/22   Gwyneth Sprout, MD  lisinopril (ZESTRIL) 20 MG tablet Take 1 tablet (20 mg total) by mouth daily. 10/13/22   Gwyneth Sprout, MD  metFORMIN (GLUCOPHAGE) 1000 MG tablet Take 1 tablet (1,000 mg total) by  mouth 2 (two) times daily with a meal. 12/05/21   Kathlen Mody, MD  pravastatin (PRAVACHOL) 40 MG tablet Take 1 tablet (40 mg total) by mouth daily. 10/13/22   Gwyneth Sprout, MD      Allergies    Patient has no known allergies.    Review of Systems   Review of Systems  Physical Exam Updated Vital Signs BP (!) 158/87   Pulse (!) 53   Temp 98.2 F (36.8 C) (Oral)   Resp 18   Ht 5\' 5"  (1.651 m)   Wt 81.6 kg   LMP 10/28/2016 (Within Weeks)   SpO2 100%   BMI 29.95 kg/m  Physical Exam Vitals and nursing note reviewed.  Constitutional:      General: She is not in acute distress.    Appearance: She is well-developed.  HENT:     Head: Normocephalic and atraumatic.     Mouth/Throat:     Mouth: Mucous membranes are dry.  Eyes:     Pupils: Pupils are equal, round, and reactive to light.  Cardiovascular:     Rate and Rhythm: Normal rate and regular rhythm.     Heart sounds: Normal heart sounds. No murmur heard.    No friction rub.  Pulmonary:     Effort: Pulmonary effort is normal.     Breath sounds: Normal breath sounds. No wheezing or rales.  Abdominal:     General: Bowel sounds are normal.  There is no distension.     Palpations: Abdomen is soft.     Tenderness: There is no abdominal tenderness. There is no guarding or rebound.  Musculoskeletal:        General: No tenderness. Normal range of motion.     Right lower leg: No edema.     Left lower leg: No edema.     Comments: No edema  Skin:    General: Skin is warm and dry.     Findings: No rash.  Neurological:     Mental Status: She is alert and oriented to person, place, and time.     Cranial Nerves: No cranial nerve deficit.  Psychiatric:        Behavior: Behavior normal.     ED Results / Procedures / Treatments   Labs (all labs ordered are listed, but only abnormal results are displayed) Labs Reviewed  COMPREHENSIVE METABOLIC PANEL - Abnormal; Notable for the following components:      Result Value    Sodium 130 (*)    Chloride 91 (*)    Glucose, Bld 249 (*)    BUN 43 (*)    Creatinine, Ser 1.54 (*)    Calcium 8.5 (*)    GFR, Estimated 40 (*)    All other components within normal limits  CBG MONITORING, ED - Abnormal; Notable for the following components:   Glucose-Capillary 240 (*)    All other components within normal limits  CBC WITH DIFFERENTIAL/PLATELET  LIPASE, BLOOD    EKG None  Radiology No results found.  Procedures Procedures    Medications Ordered in ED Medications  lactated ringers bolus 1,000 mL (0 mLs Intravenous Stopped 10/13/22 1245)  lactated ringers bolus 1,000 mL (1,000 mLs Intravenous New Bag/Given 10/13/22 1245)    ED Course/ Medical Decision Making/ A&P                                 Medical Decision Making Amount and/or Complexity of Data Reviewed Labs: ordered. Decision-making details documented in ED Course.   Pt with multiple medical problems and comorbidities and presenting today with a complaint that caries a high risk for morbidity and mortality.  Here today with complaints of nausea vomiting diarrhea and now feeling weak.  Patient overall is in no acute distress.  She is hemodynamically stable at this time.  Still having some nausea but no further diarrhea.  Low suspicion for acute abdominal issues such as diverticulitis, appendicitis, bowel obstruction.  Patient's abdomen is benign.  Low suspicion for urinary issues or kidney stone.  Patient is not having any dysuria.  Concern for dehydration and electrolyte abnormality.  Low suspicion for cardiac or respiratory causes.   Patient's blood sugar is also elevated and has been out of meds for several months because she is having insurance issues.  Blood sugar today is 240.  Patient given IV fluids and labs pending. 1:07 PM I independently interpreted patient's labs and lipase within normal limits, CBC within normal limits, CMP with hyponatremia with sodium of 130 today, normal potassium and mild  AKI with creatinine of 1.54 from her baseline 0.9.  Patient given a second liter of fluid and reports feeling better.  Tolerating p.o.'s at this time.  Transition of care came and saw the patient and her prescriptions will be sent to the outpatient [facility] pharmacy so that patient can get back on her normal meds.  No reason for  further testing or admission at this time.  Patient stable for discharge.  She and her husband are comfortable with this plan.         Final Clinical Impression(s) / ED Diagnoses Final diagnoses:  AKI (acute kidney injury) (HCC)  Dehydration  Hyperglycemia    Rx / DC Orders ED Discharge Orders          Ordered    carvedilol (COREG) 6.25 MG tablet  2 times daily with meals        10/13/22 1307    glucose blood (RELION CONFIRM/MICRO TEST) test strip        10/13/22 1307    Insulin Glargine (BASAGLAR KWIKPEN) 100 UNIT/ML  2 times daily        10/13/22 1307    lisinopril (ZESTRIL) 20 MG tablet  Daily        10/13/22 1307    pravastatin (PRAVACHOL) 40 MG tablet  Daily        10/13/22 1307    Lancets (RELION LANCET DEVICES 30G) MISC  3 times daily        10/13/22 1307              Gwyneth Sprout, MD 10/13/22 1307

## 2022-10-13 NOTE — ED Triage Notes (Signed)
Pt reports bilateral lower extremity weakness x2 days. Denies injuries. No focal deficits. Hx of diabetes-does not check BGL or take prescribed meds.

## 2022-10-19 ENCOUNTER — Other Ambulatory Visit (HOSPITAL_COMMUNITY): Payer: Self-pay

## 2022-10-21 ENCOUNTER — Other Ambulatory Visit (HOSPITAL_COMMUNITY): Payer: Self-pay

## 2022-10-24 ENCOUNTER — Other Ambulatory Visit (HOSPITAL_COMMUNITY): Payer: Self-pay

## 2022-11-17 ENCOUNTER — Other Ambulatory Visit (HOSPITAL_COMMUNITY): Payer: Self-pay

## 2022-11-30 ENCOUNTER — Other Ambulatory Visit: Payer: Self-pay

## 2022-11-30 NOTE — Progress Notes (Signed)
Renda Bernstein Dec 04, 1970 [id number]  Patient attempted to be outreached by Thomasene Ripple, PharmD Candidate to discuss hypertension.   Thomasene Ripple, Student-PharmD

## 2022-12-02 ENCOUNTER — Ambulatory Visit (INDEPENDENT_AMBULATORY_CARE_PROVIDER_SITE_OTHER): Payer: 59 | Admitting: Primary Care

## 2022-12-07 ENCOUNTER — Telehealth (INDEPENDENT_AMBULATORY_CARE_PROVIDER_SITE_OTHER): Payer: Self-pay

## 2022-12-07 NOTE — Telephone Encounter (Signed)
Contacted pt to schedule a bp check pt didn't answer was unable to lvm    If pt calls back please schedule nurse visit for bp check will need to be before the end of the month

## 2023-01-01 ENCOUNTER — Other Ambulatory Visit (HOSPITAL_COMMUNITY): Payer: Self-pay

## 2023-01-13 ENCOUNTER — Other Ambulatory Visit (HOSPITAL_COMMUNITY): Payer: Self-pay

## 2023-01-25 ENCOUNTER — Other Ambulatory Visit (HOSPITAL_COMMUNITY): Payer: Self-pay

## 2023-08-14 ENCOUNTER — Emergency Department (HOSPITAL_COMMUNITY)

## 2023-08-14 ENCOUNTER — Encounter (HOSPITAL_COMMUNITY): Payer: Self-pay | Admitting: *Deleted

## 2023-08-14 ENCOUNTER — Emergency Department (HOSPITAL_COMMUNITY)
Admission: EM | Admit: 2023-08-14 | Discharge: 2023-08-14 | Disposition: A | Discharge status: Home / Self Care | Attending: Emergency Medicine | Admitting: Emergency Medicine

## 2023-08-14 ENCOUNTER — Other Ambulatory Visit: Payer: Self-pay

## 2023-08-14 DIAGNOSIS — S42252A Displaced fracture of greater tuberosity of left humerus, initial encounter for closed fracture: Secondary | ICD-10-CM | POA: Insufficient documentation

## 2023-08-14 DIAGNOSIS — W19XXXA Unspecified fall, initial encounter: Secondary | ICD-10-CM | POA: Insufficient documentation

## 2023-08-14 DIAGNOSIS — Y990 Civilian activity done for income or pay: Secondary | ICD-10-CM | POA: Diagnosis not present

## 2023-08-14 DIAGNOSIS — S43005A Unspecified dislocation of left shoulder joint, initial encounter: Secondary | ICD-10-CM

## 2023-08-14 DIAGNOSIS — Z7984 Long term (current) use of oral hypoglycemic drugs: Secondary | ICD-10-CM | POA: Diagnosis not present

## 2023-08-14 DIAGNOSIS — S42292A Other displaced fracture of upper end of left humerus, initial encounter for closed fracture: Secondary | ICD-10-CM

## 2023-08-14 DIAGNOSIS — Z79899 Other long term (current) drug therapy: Secondary | ICD-10-CM | POA: Insufficient documentation

## 2023-08-14 DIAGNOSIS — E119 Type 2 diabetes mellitus without complications: Secondary | ICD-10-CM | POA: Insufficient documentation

## 2023-08-14 DIAGNOSIS — I10 Essential (primary) hypertension: Secondary | ICD-10-CM | POA: Diagnosis not present

## 2023-08-14 DIAGNOSIS — S4992XA Unspecified injury of left shoulder and upper arm, initial encounter: Secondary | ICD-10-CM | POA: Diagnosis present

## 2023-08-14 DIAGNOSIS — S43015A Anterior dislocation of left humerus, initial encounter: Secondary | ICD-10-CM | POA: Diagnosis not present

## 2023-08-14 DIAGNOSIS — M7989 Other specified soft tissue disorders: Secondary | ICD-10-CM | POA: Insufficient documentation

## 2023-08-14 DIAGNOSIS — Z794 Long term (current) use of insulin: Secondary | ICD-10-CM | POA: Diagnosis not present

## 2023-08-14 LAB — COMPREHENSIVE METABOLIC PANEL WITH GFR
ALT: 16 U/L (ref 0–44)
AST: 21 U/L (ref 15–41)
Albumin: 3.8 g/dL (ref 3.5–5.0)
Alkaline Phosphatase: 78 U/L (ref 38–126)
Anion gap: 10 (ref 5–15)
BUN: 21 mg/dL — ABNORMAL HIGH (ref 6–20)
CO2: 24 mmol/L (ref 22–32)
Calcium: 8.9 mg/dL (ref 8.9–10.3)
Chloride: 102 mmol/L (ref 98–111)
Creatinine, Ser: 1.06 mg/dL — ABNORMAL HIGH (ref 0.44–1.00)
GFR, Estimated: >60 mL/min (ref >60)
Glucose, Bld: 214 mg/dL — ABNORMAL HIGH (ref 70–99)
Potassium: 3.7 mmol/L (ref 3.5–5.1)
Sodium: 136 mmol/L (ref 135–145)
Total Bilirubin: 0.3 mg/dL (ref 0.0–1.2)
Total Protein: 7.1 g/dL (ref 6.5–8.1)

## 2023-08-14 LAB — CBC
HCT: 35.2 % — ABNORMAL LOW (ref 36.0–46.0)
Hemoglobin: 11.1 g/dL — ABNORMAL LOW (ref 12.0–15.0)
MCH: 27.2 pg (ref 26.0–34.0)
MCHC: 31.5 g/dL (ref 30.0–36.0)
MCV: 86.3 fL (ref 80.0–100.0)
Platelets: 250 10*3/uL (ref 150–400)
RBC: 4.08 MIL/uL (ref 3.87–5.11)
RDW: 14.9 % (ref 11.5–15.5)
WBC: 10.4 10*3/uL (ref 4.0–10.5)
nRBC: 0 % (ref 0.0–0.2)

## 2023-08-14 MED ORDER — MORPHINE SULFATE (PF) 2 MG/ML IV SOLN
2.0000 mg | Freq: Once | INTRAVENOUS | Status: AC
  Administered 2023-08-14: 2 mg via INTRAVENOUS
  Filled 2023-08-14: qty 1

## 2023-08-14 MED ORDER — HYDROCODONE-ACETAMINOPHEN 5-325 MG PO TABS: 1.0000 | ORAL_TABLET | Freq: Four times a day (QID) | ORAL | 0 refills | Status: AC | PRN

## 2023-08-14 MED ORDER — AMLODIPINE BESYLATE 5 MG PO TABS: 5.0000 mg | ORAL_TABLET | Freq: Every day | ORAL | 0 refills | Status: AC

## 2023-08-14 MED ORDER — OXYCODONE-ACETAMINOPHEN 5-325 MG PO TABS
1.0000 | ORAL_TABLET | Freq: Once | ORAL | Status: AC
  Administered 2023-08-14: 1 via ORAL
  Filled 2023-08-14: qty 1

## 2023-08-14 MED ORDER — LISINOPRIL 20 MG PO TABS: 20.0000 mg | ORAL_TABLET | Freq: Every day | ORAL | 0 refills | Status: DC

## 2023-08-14 MED ORDER — CARVEDILOL 6.25 MG PO TABS: 6.2500 mg | ORAL_TABLET | Freq: Two times a day (BID) | ORAL | 0 refills | Status: DC

## 2023-08-14 MED ORDER — ONDANSETRON 4 MG PO TBDP
8.0000 mg | ORAL_TABLET | Freq: Once | ORAL | Status: AC
  Administered 2023-08-14: 8 mg via ORAL
  Filled 2023-08-14: qty 2

## 2023-08-14 NOTE — Discharge Instructions (Addendum)
 You came to the emergency room for pain in your shoulder after a fall. You initially had a dislocated shoulder in addition to fracture of the upper part of your shoulder. Your shoulder relocated on its own. You were placed in a sling to immobilize your shoulder. Please do not move or take your shoulder out of the sling. You need to follow-up with orthopedic surgeon. The phone numbers provided below to call to schedule an appointment. You have any numbness tingling new weakness in your hand or your fingers start to turn blue please seek medical care immediately as this can be an emergency. Your blood pressure was high in the ED. I have sent your home medications to your pharmacy. If you have any chest pain, shortness or breath, please seek care immediately.

## 2023-08-14 NOTE — ED Notes (Signed)
 Pt says she has not taken her BP medication "in months"

## 2023-08-14 NOTE — ED Provider Notes (Signed)
 [place] EMERGENCY DEPARTMENT AT [facility] Provider Note   CSN: [id number] Arrival date & time: 08/14/23  1606     History  Chief Complaint  Patient presents with   Shoulder Injury    Hannah Dougherty is a 53 y.o. female.  Hannah Dougherty is 53 y.o. female presenting after fall on to left shoulder.  PMHx includes HTN, T2DM.  Reports she tripped over the rug this morning.  She is a cleaner.  She is unsure of how she fell.  She believes her left arm was outstretched when she fell.  But cannot state where exactly she landed.  She has had shoulder pain since then.  She is unable to move her left shoulder.  She she is able to move the rest of her arm and her fingers.  She is not on blood thinners.        Home Medications Prior to Admission medications   Medication Sig Start Date End Date Taking? Authorizing Provider  amLODipine  (NORVASC ) 5 MG tablet Take 1 tablet (5 mg total) by mouth daily. 08/14/23  Yes Ivin Marrow, MD  carvedilol  (COREG ) 6.25 MG tablet Take 1 tablet (6.25 mg total) by mouth 2 (two) times daily with a meal. 08/14/23  Yes Ivin Marrow, MD  HYDROcodone -acetaminophen  (NORCO/VICODIN) 5-325 MG tablet Take 1 tablet by mouth every 6 (six) hours as needed for up to 5 days. 08/14/23 08/19/23 Yes Ivin Marrow, MD  lisinopril  (ZESTRIL ) 20 MG tablet Take 1 tablet (20 mg total) by mouth daily. 08/14/23  Yes Ivin Marrow, MD  glucose blood (RELION CONFIRM/MICRO TEST) test strip Use as instructed 10/13/22   Almond Army, MD  Insulin  Glargine (BASAGLAR  KWIKPEN) 100 UNIT/ML Inject 15 Units into the skin 2 (two) times daily. 10/13/22   Almond Army, MD  Lancets (RELION LANCET DEVICES 30G) MISC 1 each by Does not apply route 3 (three) times daily. 10/13/22   Almond Army, MD  metFORMIN  (GLUCOPHAGE ) 1000 MG tablet Take 1 tablet (1,000 mg total) by mouth 2 (two) times daily with a meal. 12/05/21   Feliciana Horn, MD  pravastatin  (PRAVACHOL ) 40 MG tablet  Take 1 tablet (40 mg total) by mouth daily. 10/13/22   Almond Army, MD      Allergies    Patient has no known allergies.    Review of Systems   Review of Systems  Physical Exam Updated Vital Signs BP (!) 238/102 (BP Location: Right Arm)   Pulse 68   Temp 98.7 F (37.1 C) (Oral)   Resp 18   Ht 5\' 5"  (1.651 m)   Wt 81.6 kg   LMP 10/28/2016 (Within Weeks)   SpO2 99%   BMI 29.94 kg/m  Physical Exam Vitals and nursing note reviewed.  Constitutional:      General: She is not in acute distress.    Appearance: She is well-developed.  HENT:     Head: Normocephalic and atraumatic.  Eyes:     Conjunctiva/sclera: Conjunctivae normal.  Cardiovascular:     Rate and Rhythm: Normal rate and regular rhythm.     Heart sounds: No murmur heard. Pulmonary:     Effort: Pulmonary effort is normal. No respiratory distress.     Breath sounds: Normal breath sounds.  Abdominal:     Palpations: Abdomen is soft.     Tenderness: There is no abdominal tenderness.  Musculoskeletal:     Cervical back: Neck supple.     Comments: Notable anterior shoulder swelling on the left. Tender to palpation along  the humeral head. Radial pulse 2+ Patient description 5 out of 5 Sensation intact throughout the left extremity. Full range of motion of fingers wrist and elbow. Unable to perform any shoulder range of motion secondary to pain.  Skin:    General: Skin is warm and dry.     Capillary Refill: Capillary refill takes less than 2 seconds.  Neurological:     Mental Status: She is alert.  Psychiatric:        Mood and Affect: Mood normal.     ED Results / Procedures / Treatments   Labs (all labs ordered are listed, but only abnormal results are displayed) Labs Reviewed  COMPREHENSIVE METABOLIC PANEL WITH GFR - Abnormal; Notable for the following components:      Result Value   Glucose, Bld 214 (*)    BUN 21 (*)    Creatinine, Ser 1.06 (*)    All other components within normal limits  CBC  - Abnormal; Notable for the following components:   Hemoglobin 11.1 (*)    HCT 35.2 (*)    All other components within normal limits    EKG None  Radiology CT Shoulder Left Wo Contrast Result Date: 08/14/2023 CLINICAL DATA:  Traumatic shoulder pain EXAM: CT OF THE UPPER LEFT EXTREMITY WITHOUT CONTRAST TECHNIQUE: Multidetector CT imaging of the upper left extremity was performed according to the standard protocol. RADIATION DOSE REDUCTION: This exam was performed according to the departmental dose-optimization program which includes automated exposure control, adjustment of the mA and/or kV according to patient size and/or use of iterative reconstruction technique. COMPARISON:  Left shoulder x-ray same day FINDINGS: Bones/Joint/Cartilage Alignment is now anatomic. No dislocation or subluxation. There is an acute transverse comminuted fracture of the surgical neck of the humerus. The distal fracture fragment is displaced 8 mm medially and mildly impacted. There is also acute comminuted nondisplaced fracture involving the greater tuberosity of the humeral head. Lipohemarthrosis present. Ligaments Suboptimally assessed by CT. Muscles and Tendons There is intramuscular edema surrounding the fracture. Soft tissues There is surrounding subcutaneous edema. IMPRESSION: 1. Acute comminuted fracture of the surgical neck of the humerus with mild displacement and impaction. 2. Acute comminuted nondisplaced fracture of the greater tuberosity of the humeral head. 3. Lipohemarthrosis. Electronically Signed   By: Tyron Gallon M.D.   On: 08/14/2023 20:44   DG Shoulder Left Result Date: 08/14/2023 CLINICAL DATA:  Left shoulder pain after fall. EXAM: LEFT SHOULDER - 2+ VIEW COMPARISON:  None Available. FINDINGS: Anterior dislocation of the humeral head relative to the glenoid. Acute comminuted fracture of the left humeral greater tuberosity with 1-1.3 cm of lateral displacement of the greater tuberosity fracture  fragment. Impacted transverse fracture at the level of the surgical neck. No additional fracture identified. The acromioclavicular joint is anatomically aligned with mild degenerative changes. Soft tissue swelling of the left shoulder. IMPRESSION: Anterior glenohumeral dislocation with acute comminuted and displaced fracture of the left proximal humerus greater tuberosity and mildly impacted transverse fracture of the humeral surgical neck. Electronically Signed   By: Mannie Seek M.D.   On: 08/14/2023 18:34    Procedures Procedures    Medications Ordered in ED Medications  ondansetron  (ZOFRAN -ODT) disintegrating tablet 8 mg (8 mg Oral Given 08/14/23 1709)  oxyCODONE -acetaminophen  (PERCOCET/ROXICET) 5-325 MG per tablet 1 tablet (1 tablet Oral Given 08/14/23 1710)  morphine  (PF) 2 MG/ML injection 2 mg (2 mg Intravenous Given 08/14/23 2053)    ED Course/ Medical Decision Making/ A&P Clinical Course as of 08/14/23 2229  Sat Aug 14, 2023  1926 CBC(!) Hemoglobin 11.1, doubt contributing to today's presentation.  Will need outpatient follow-up. [MQ]  1927 Comprehensive metabolic panel(!) Unremarkable CMP. [MQ]  1927 Anteriorly dislocated left shoulder.  Comminuted greater tuberosity fracture.  Fracture of the surgical neck of the humerus.  I have paged orthopedic surgery as this is likely a dislocation is not able to be reduced secondary to the fractures [MQ]  2008 Spoke to Dr.Dumonski, orthopedics, he recommended following up CT scan of shoulder for further evaluation of the fracture, unclear whether he would be safe reduction as there may or may not be a lever to safely reduce humeral head.  CT shoulder ordered. [MQ]    Clinical Course User Index [MQ] Ivin Marrow, MD                                 Medical Decision Making Patient presenting after fall onto left shoulder.  X-rays consistent with anterior dislocation and fracture of the greater tuberosity and surgical neck of the  humerus.  Has no evidence of neurovascular compromise on exam, no evidence of compartment syndrome.  Will treat pain and evaluate with orthopedic consultation.  Reevaluated patient after CT shoulder demonstrating spontaneous relocation of left humeral head.  Patient reports she extended her arm and felt a clunk prior to her going to CT scan.  CT scan redemonstrates greater tuberosity fracture and fracture of surgical neck of humerus.  Continues to be neurovascularly intact after relocation.  Re-discussed case with Dr. Jackee Marus he recommends shoulder sling and outpatient follow-up with orthopedic sports.  Will provide patient with short course of oxycodone  for acute pain.  Patient stable for discharge, discussed strict return precautions and orthopedic follow-up plan.  Provided in AVS.  Amount and/or Complexity of Data Reviewed Labs:  Decision-making details documented in ED Course. Radiology: ordered.  Risk Prescription drug management.           Final Clinical Impression(s) / ED Diagnoses Final diagnoses:  Shoulder dislocation, left, initial encounter  Closed fracture of head of left humerus, initial encounter  Closed displaced fracture of greater tuberosity of left humerus, initial encounter  Hypertension, unspecified type    Rx / DC Orders ED Discharge Orders          Ordered    HYDROcodone -acetaminophen  (NORCO/VICODIN) 5-325 MG tablet  Every 6 hours PRN        08/14/23 2124    lisinopril  (ZESTRIL ) 20 MG tablet  Daily        08/14/23 2142    amLODipine  (NORVASC ) 5 MG tablet  Daily        08/14/23 2142    carvedilol  (COREG ) 6.25 MG tablet  2 times daily with meals        08/14/23 2142              Ivin Marrow, MD 08/14/23 2230    Afton Horse T, DO 08/21/23 1454

## 2023-08-14 NOTE — ED Triage Notes (Signed)
 The pt fell today at work on some trash in her way since then she has had pain in her lr upper arm and shoulder

## 2023-09-26 ENCOUNTER — Emergency Department (HOSPITAL_COMMUNITY)

## 2023-09-26 ENCOUNTER — Emergency Department (HOSPITAL_COMMUNITY)
Admission: EM | Admit: 2023-09-26 | Discharge: 2023-09-26 | Disposition: A | Discharge status: Home / Self Care | Attending: Emergency Medicine | Admitting: Emergency Medicine

## 2023-09-26 DIAGNOSIS — Z794 Long term (current) use of insulin: Secondary | ICD-10-CM | POA: Insufficient documentation

## 2023-09-26 DIAGNOSIS — R030 Elevated blood-pressure reading, without diagnosis of hypertension: Secondary | ICD-10-CM

## 2023-09-26 DIAGNOSIS — E119 Type 2 diabetes mellitus without complications: Secondary | ICD-10-CM | POA: Insufficient documentation

## 2023-09-26 DIAGNOSIS — I10 Essential (primary) hypertension: Secondary | ICD-10-CM | POA: Insufficient documentation

## 2023-09-26 DIAGNOSIS — N151 Renal and perinephric abscess: Secondary | ICD-10-CM | POA: Insufficient documentation

## 2023-09-26 DIAGNOSIS — S6992XA Unspecified injury of left wrist, hand and finger(s), initial encounter: Secondary | ICD-10-CM | POA: Diagnosis present

## 2023-09-26 DIAGNOSIS — S42212D Unspecified displaced fracture of surgical neck of left humerus, subsequent encounter for fracture with routine healing: Secondary | ICD-10-CM | POA: Diagnosis not present

## 2023-09-26 DIAGNOSIS — R0789 Other chest pain: Secondary | ICD-10-CM | POA: Insufficient documentation

## 2023-09-26 DIAGNOSIS — Z79899 Other long term (current) drug therapy: Secondary | ICD-10-CM | POA: Diagnosis not present

## 2023-09-26 DIAGNOSIS — N2889 Other specified disorders of kidney and ureter: Secondary | ICD-10-CM

## 2023-09-26 DIAGNOSIS — Y92410 Unspecified street and highway as the place of occurrence of the external cause: Secondary | ICD-10-CM | POA: Diagnosis not present

## 2023-09-26 DIAGNOSIS — Z7984 Long term (current) use of oral hypoglycemic drugs: Secondary | ICD-10-CM | POA: Diagnosis not present

## 2023-09-26 LAB — I-STAT CHEM 8, ED
BUN: 20 mg/dL (ref 6–20)
Calcium, Ion: 1.21 mmol/L (ref 1.15–1.40)
Chloride: 103 mmol/L (ref 98–111)
Creatinine, Ser: 1.2 mg/dL — ABNORMAL HIGH (ref 0.44–1.00)
Glucose, Bld: 245 mg/dL — ABNORMAL HIGH (ref 70–99)
HCT: 40 % (ref 36.0–46.0)
Hemoglobin: 13.6 g/dL (ref 12.0–15.0)
Potassium: 4.1 mmol/L (ref 3.5–5.1)
Sodium: 141 mmol/L (ref 135–145)
TCO2: 30 mmol/L (ref 22–32)

## 2023-09-26 MED ORDER — HYDROCODONE-ACETAMINOPHEN 5-325 MG PO TABS
1.0000 | ORAL_TABLET | Freq: Once | ORAL | Status: AC
  Administered 2023-09-26: 1 via ORAL
  Filled 2023-09-26: qty 1

## 2023-09-26 MED ORDER — LISINOPRIL 20 MG PO TABS
20.0000 mg | ORAL_TABLET | Freq: Once | ORAL | Status: AC
  Administered 2023-09-26: 20 mg via ORAL
  Filled 2023-09-26: qty 1

## 2023-09-26 MED ORDER — ONDANSETRON HCL 4 MG/2ML IJ SOLN
4.0000 mg | Freq: Once | INTRAMUSCULAR | Status: DC
  Filled 2023-09-26: qty 2

## 2023-09-26 MED ORDER — ONDANSETRON 4 MG PO TBDP
4.0000 mg | ORAL_TABLET | Freq: Once | ORAL | Status: AC
  Administered 2023-09-26: 4 mg via ORAL
  Filled 2023-09-26: qty 1

## 2023-09-26 MED ORDER — FENTANYL CITRATE PF 50 MCG/ML IJ SOSY
25.0000 ug | PREFILLED_SYRINGE | Freq: Once | INTRAMUSCULAR | Status: DC
  Filled 2023-09-26: qty 1

## 2023-09-26 MED ORDER — LISINOPRIL 20 MG PO TABS
20.0000 mg | ORAL_TABLET | Freq: Every day | ORAL | 0 refills | Status: AC
  Filled 2023-09-26: qty 30, 30d supply, fill #0

## 2023-09-26 MED ORDER — OXYCODONE HCL 5 MG PO TABS
5.0000 mg | ORAL_TABLET | ORAL | 0 refills | Status: AC | PRN
  Filled 2023-09-26: qty 10, 2d supply, fill #0

## 2023-09-26 MED ORDER — CARVEDILOL 6.25 MG PO TABS
6.2500 mg | ORAL_TABLET | Freq: Two times a day (BID) | ORAL | 0 refills | Status: AC
  Filled 2023-09-26: qty 60, 30d supply, fill #0

## 2023-09-26 MED ORDER — CARVEDILOL 3.125 MG PO TABS
6.2500 mg | ORAL_TABLET | Freq: Once | ORAL | Status: AC
  Administered 2023-09-26: 6.25 mg via ORAL
  Filled 2023-09-26: qty 2

## 2023-09-26 MED ORDER — IOHEXOL 350 MG/ML SOLN
75.0000 mL | Freq: Once | INTRAVENOUS | Status: AC | PRN
  Administered 2023-09-26: 75 mL via INTRAVENOUS

## 2023-09-26 NOTE — Discharge Instructions (Addendum)
 It is imperative that you call the urology office tomorrow morning to schedule an appointment for the concerning finding on the CT scan which indicates renal cell carcinoma.  If you do not attend to this as soon as possible this can get worse and spread to other organs.  There was evidence of fracture of the left humerus but this seems to be consistent with your previous fracture on May 31.  Follow-up with your orthopedist tomorrow as scheduled.  I have prescribed you your blood pressure medications.  Follow-up with your PCP.  They can also keep an eye on your blood sugar.  Return for any emergent symptoms.  I sent some pain medicine into the pharmacy for you.  This will make you drowsy.  Do not drive or do anything else dangerous after taking them.  You can take Tylenol  1000 mg every 6 hours, ibuprofen  600 mg every 8 hours.
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
## 2023-09-27 ENCOUNTER — Other Ambulatory Visit: Payer: Self-pay

## 2023-09-30 ENCOUNTER — Other Ambulatory Visit: Payer: Self-pay

## 2023-10-07 ENCOUNTER — Encounter: Payer: Self-pay | Admitting: Physical Therapy

## 2023-10-07 ENCOUNTER — Other Ambulatory Visit: Payer: Self-pay

## 2023-10-07 ENCOUNTER — Ambulatory Visit: Payer: MEDICAID | Attending: Family Medicine | Admitting: Physical Therapy

## 2023-10-07 DIAGNOSIS — R252 Cramp and spasm: Secondary | ICD-10-CM | POA: Insufficient documentation

## 2023-10-07 DIAGNOSIS — M25612 Stiffness of left shoulder, not elsewhere classified: Secondary | ICD-10-CM | POA: Insufficient documentation

## 2023-10-07 DIAGNOSIS — M25512 Pain in left shoulder: Secondary | ICD-10-CM | POA: Insufficient documentation

## 2023-10-07 NOTE — Therapy (Signed)
 OUTPATIENT PHYSICAL THERAPY SHOULDER EVALUATION   Patient Name: Hannah Dougherty MRN: [id number] DOB:Dec 02, 1970, 53 y.o., female Today's Date: 10/07/2023  END OF SESSION:  PT End of Session - 10/07/23 0929     Visit Number 1    Date for PT Re-Evaluation 01/07/24    Authorization Type [employer]?    PT Start Time 0930    PT Stop Time 1019    PT Time Calculation (min) 49 min    Activity Tolerance Patient tolerated treatment well    Behavior During Therapy WFL for tasks assessed/performed          Past Medical History:  Diagnosis Date   Diabetes mellitus without complication (HCC)    Hypertension    Past Surgical History:  Procedure Laterality Date   COLONOSCOPY N/A 12/02/2021   Procedure: COLONOSCOPY;  Surgeon: Burnette Fallow, MD;  Location: [facility];  Service: Gastroenterology;  Laterality: N/A;   HEMOSTASIS CLIP PLACEMENT  12/02/2021   Procedure: HEMOSTASIS CLIP PLACEMENT;  Surgeon: Burnette Fallow, MD;  Location: [facility];  Service: Gastroenterology;;   POLYPECTOMY  12/02/2021   Procedure: POLYPECTOMY;  Surgeon: Burnette Fallow, MD;  Location: [facility];  Service: Gastroenterology;;   SUBMUCOSAL TATTOO INJECTION  12/02/2021   Procedure: SUBMUCOSAL TATTOO INJECTION;  Surgeon: Burnette Fallow, MD;  Location: [facility];  Service: Gastroenterology;;   TUBAL LIGATION     VOCAL CORD LATERALIZATION, ENDOSCOPIC APPROACH W/ MLB     Patient Active Problem List   Diagnosis Date Noted   Renal artery stenosis (HCC) 12/01/2021   Hematochezia 11/30/2021   Insurance coverage problems 11/30/2021   Type 2 diabetes mellitus with hyperglycemia (HCC) 06/08/2021   Class 2 obesity 06/08/2021   Acute gastroenteritis 06/08/2021   Hypertensive urgency 02/16/2020   Hyperlipidemia associated with type 2 diabetes mellitus (HCC) 02/16/2020   Right kidney mass 02/16/2020   Left ovarian cyst 02/16/2020   Trichomoniasis 02/16/2020   Hypertension 12/28/2018   Healthcare maintenance  12/14/2018   Hyponatremia 12/10/2018   DMII (diabetes mellitus, type 2) (HCC) 12/09/2018    PCP: EMERSON Bohr, NP  REFERRING PROVIDER: Irving, MD  REFERRING DIAG: s/p left humeral fracture  THERAPY DIAG:  Acute pain of left shoulder  Stiffness of left shoulder, not elsewhere classified  Cramp and spasm  Rationale for Evaluation and Treatment: Rehabilitation  ONSET DATE: 08/14/23  SUBJECTIVE:                                                                                                                                                                                      SUBJECTIVE STATEMENT: 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  She  sustained a left p Hand dominance: Right  PERTINENT HISTORY: See above  PAIN:  Are you having pain? Yes: NPRS scale: 7/10 Pain location: left shoulder and left upper arm Pain description: ache Aggravating factors: reaching moving pain up to 10/10 Relieving factors: being in the sling, not moving, Tylenol  at its best pain down to a 3/10  PRECAUTIONS: Other: MD order is for PROM only  RED FLAGS: None   WEIGHT BEARING RESTRICTIONS: No  FALLS:  Has patient fallen in last 6 months? Yes. Number of falls 1  LIVING ENVIRONMENT: Lives with: lives with their family Lives in: House/apartment Stairs: stairs at work not at home Has following equipment at home: None  OCCUPATION: Housekeeping, push/pull, lift and reach  PLOF: Independent and does housework   PATIENT GOALS:get back to work, have good motion  NEXT MD VISIT:   OBJECTIVE:  Note: Objective measures were completed at Evaluation unless otherwise noted.  DIAGNOSTIC FINDINGS:  IMPRESSION: 1. Acute comminuted fracture of the surgical neck of the humerus with mild displacement and impaction. 2. Acute comminuted nondisplaced fracture of the greater tuberosity of the humeral head. 3. Lipohemarthrosis.  PATIENT SURVEYS:   Quick Dash:  QUICK DASH  Please rate your ability do the following activities in the last week by selecting the number below the appropriate response.   Activities Rating  Open a tight or new jar.  4 = Severe difficulty  Do heavy household chores (e.g., wash walls, floors). 5 = Unable  Carry a shopping bag or briefcase 5 = Unable  Wash your back. 5 = Unable  Use a knife to cut food. 3 = Moderate difficulty  Recreational activities in which you take some force or impact through your arm, shoulder or hand (e.g., golf, hammering, tennis, etc.). 5 = Unable  During the past week, to what extent has your arm, shoulder or hand problem interfered with your normal social activities with family, friends, neighbors or groups?  5 = Extremely  During the past week, were you limited in your work or other regular daily activities as a result of your arm, shoulder or hand problem? 5 = Unable  Rate the severity of the following symptoms in the last week: Arm, Shoulder, or hand pain. 4 = Severe  Rate the severity of the following symptoms in the last week: Tingling (pins and needles) in your arm, shoulder or hand. 2 = Mild  During the past week, how much difficulty have you had sleeping because of the pain in your arm, shoulder or hand?  4 = Severe difficulty   (A QuickDASH score may not be calculated if there is greater than 1 missing item.)  Quick Dash Disability/Symptom Score: [(sum of 42 (n) responses/55 (n)] x 25 = 84%  Minimally Clinically Important Difference (MCID): 15-20 points  (Franchignoni, F. et al. (2013). Minimally clinically important difference of the disabilities of the arm, shoulder, and hand outcome measures (DASH) and its shortened version (Quick DASH). Journal of Orthopaedic & Sports Physical Therapy, 44(1), 30-39)   COGNITION: Overall cognitive status: Within functional limits for tasks assessed     SENSATION: WFL  POSTURE: Fwd head, rounded shoulders  UPPER EXTREMITY ROM:    Passive ROM Right eval Left PROM eval  Shoulder flexion  92  Shoulder extension    Shoulder abduction  72  Shoulder adduction    Shoulder internal rotation  25  Shoulder external rotation  30  Elbow flexion    Elbow extension    Wrist flexion  Wrist extension    Wrist ulnar deviation    Wrist radial deviation    Wrist pronation    Wrist supination    (Blank rows = not tested)  UPPER EXTREMITY MMT:  Not tested due to fracture and MD order for PROM only  SHOULDER SPECIAL TESTS:  no special testing due to fracture JOINT MOBILITY TESTING:  Not tested due to fracture  PALPATION:  Very tight with significant tenderness in the right upper trap, tender in the right upper arm down to the elbow                                                                                                                             TREATMENT DATE:  10/07/23 Evaluation, issued HEP   PATIENT EDUCATION: Education details: HEP/POC and the limitation of the MD order for PROM Person educated: Patient Education method: Explanation, Demonstration, Tactile cues, Verbal cues, and Handouts Education comprehension: verbalized understanding  HOME EXERCISE PROGRAM: Access Code: [email or web address] URL: [email or web address] Date: 10/07/2023 Prepared by: Ozell Mainland  Exercises - Circular Shoulder Pendulum with Table Support  - 2 x daily - 7 x weekly - 1 sets - 10 reps - 10 hold - Seated Scapular Retraction  - 2 x daily - 7 x weekly - 1 sets - 10 reps - 3 hold - Seated Shoulder Flexion Towel Slide at Table Top  - 2 x daily - 7 x weekly - 1 sets - 10 reps - 10 hold - Seated Elbow Flexion Shoulder Internal Rotation AAROM at Table with Towel  - 2 x daily - 7 x weekly - 1 sets - 10 reps - 3 hold - Seated Shoulder Shrugs  - 2 x daily - 7 x weekly - 1 sets - 10 reps - 3 hold  ASSESSMENT:  CLINICAL IMPRESSION: Patient is a 53 y.o. female who was seen today for physical therapy evaluation and  treatment for left proximal humerus fracture, has had some issues with healing. The MD order for us  is PROM only.  Her job is in housekeeping at a hotel, she has to Museum/gallery curator, push and pull cart and scrub.  She is out of work now.  She is very tight and guarded in the left upper trap and neck due to fear.  She is not wearing the sling now but reports that she is usually in the sling.  Reports has to have help doing hair and some assist for dressing at times.  OBJECTIVE IMPAIRMENTS: decreased activity tolerance, decreased ROM, decreased strength, increased edema, increased fascial restrictions, increased muscle spasms, impaired flexibility, impaired UE functional use, improper body mechanics, postural dysfunction, and pain.   ACTIVITY LIMITATIONS: carrying, lifting, sleeping, bathing, dressing, and reach over head  PARTICIPATION LIMITATIONS: meal prep, cleaning, laundry, driving, community activity, occupation, and yard work  PERSONAL FACTORS: Fitness and 1-2 comorbidities: DM, HTN are also affecting patient's functional outcome.   REHAB POTENTIAL: Good  CLINICAL DECISION MAKING: Evolving/moderate complexity  EVALUATION COMPLEXITY: Low   GOALS: Goals reviewed with patient? Yes  SHORT TERM GOALS: Target date: 11/07/23  Independent with initial HEP Baseline: Goal status: INITIAL LONG TERM GOALS: Target date: 01/07/24  Independent with advanced HEP Baseline:  Goal status: INITIAL  2.  Report pain decreased with ADL's by 50% Baseline:  Goal status: INITIAL  3.  Be able to do hair and dress without assist Baseline:  Goal status: INITIAL  4.  Increase left shoulder AROM to 150 degrees Baseline:  Goal status: INITIAL  5.  Be able to lift 5# overhead with the left arm for work Baseline:  Goal status: INITIAL  PLAN:  PT FREQUENCY: 1x/week  PT DURATION: 12 weeks  PLANNED INTERVENTIONS: 97164- PT Re-evaluation, 97110-Therapeutic exercises, 97530- Therapeutic activity,  [id number]- Neuromuscular re-education, 97535- Self Care, [id number]- Manual therapy, [id number]- Electrical stimulation (unattended), 97016- Vasopneumatic device, Patient/Family education, Taping, Cryotherapy, and Moist heat  PLAN FOR NEXT SESSION: Patient is limited by MD order for PROM only, she will try HEP on her own next week and then return for us .  We would like to see her shortly before the 18th as she sees the MD on the 18th that way we could write a note for further directions regarding the orders.   OBADIAH OZELL ORN, PT 10/07/2023, 9:31 AM

## 2023-10-18 ENCOUNTER — Inpatient Hospital Stay (INDEPENDENT_AMBULATORY_CARE_PROVIDER_SITE_OTHER): Admitting: Primary Care

## 2023-10-22 ENCOUNTER — Encounter: Payer: Self-pay | Admitting: Physical Therapy

## 2023-10-22 ENCOUNTER — Ambulatory Visit: Attending: Family Medicine | Admitting: Physical Therapy

## 2023-10-22 DIAGNOSIS — M25512 Pain in left shoulder: Secondary | ICD-10-CM | POA: Insufficient documentation

## 2023-10-22 DIAGNOSIS — R252 Cramp and spasm: Secondary | ICD-10-CM | POA: Diagnosis present

## 2023-10-22 DIAGNOSIS — M25612 Stiffness of left shoulder, not elsewhere classified: Secondary | ICD-10-CM | POA: Diagnosis present

## 2023-10-22 NOTE — Therapy (Signed)
 OUTPATIENT PHYSICAL THERAPY SHOULDER EVALUATION   Patient Name: Hannah Dougherty MRN: [id number] DOB:[birth year], 53 y.o., female Today's Date: 10/22/2023  END OF SESSION:  PT End of Session - 10/22/23 1016     Visit Number 2    Date for PT Re-Evaluation 01/07/24    PT Start Time 1015    PT Stop Time 1100    PT Time Calculation (min) 45 min    Activity Tolerance Patient tolerated treatment well    Behavior During Therapy WFL for tasks assessed/performed          Past Medical History:  Diagnosis Date   Diabetes mellitus without complication (HCC)    Hypertension    Past Surgical History:  Procedure Laterality Date   COLONOSCOPY N/A 12/02/2021   Procedure: COLONOSCOPY;  Surgeon: Burnette Fallow, MD;  Location: [facility];  Service: Gastroenterology;  Laterality: N/A;   HEMOSTASIS CLIP PLACEMENT  12/02/2021   Procedure: HEMOSTASIS CLIP PLACEMENT;  Surgeon: Burnette Fallow, MD;  Location: [facility];  Service: Gastroenterology;;   POLYPECTOMY  12/02/2021   Procedure: POLYPECTOMY;  Surgeon: Burnette Fallow, MD;  Location: [facility];  Service: Gastroenterology;;   SUBMUCOSAL TATTOO INJECTION  12/02/2021   Procedure: SUBMUCOSAL TATTOO INJECTION;  Surgeon: Burnette Fallow, MD;  Location: [facility];  Service: Gastroenterology;;   TUBAL LIGATION     VOCAL CORD LATERALIZATION, ENDOSCOPIC APPROACH W/ MLB     Patient Active Problem List   Diagnosis Date Noted   Renal artery stenosis (HCC) 12/01/2021   Hematochezia 11/30/2021   Insurance coverage problems 11/30/2021   Type 2 diabetes mellitus with hyperglycemia (HCC) 06/08/2021   Class 2 obesity 06/08/2021   Acute gastroenteritis 06/08/2021   Hypertensive urgency 02/16/2020   Hyperlipidemia associated with type 2 diabetes mellitus (HCC) 02/16/2020   Right kidney mass 02/16/2020   Left ovarian cyst 02/16/2020   Trichomoniasis 02/16/2020   Hypertension 12/28/2018   Healthcare maintenance 12/14/2018   Hyponatremia 12/10/2018    DMII (diabetes mellitus, type 2) (HCC) 12/09/2018    PCP: EMERSON Bohr, NP  REFERRING PROVIDER: Irving, MD  REFERRING DIAG: s/p left humeral fracture  THERAPY DIAG:  Acute pain of left shoulder  Stiffness of left shoulder, not elsewhere classified  Cramp and spasm  Rationale for Evaluation and Treatment: Rehabilitation  ONSET DATE: 08/14/23  SUBJECTIVE:                                                                                                                                                                                      SUBJECTIVE STATEMENT: Feeling pretty good, sleep wise its hard  Hand dominance: Right  PERTINENT HISTORY: See above  PAIN:  Are you having pain?  Yes: NPRS scale: 7/10 Pain location: left shoulder and left upper arm Pain description: ache Aggravating factors: reaching moving pain up to 10/10 Relieving factors: being in the sling, not moving, Tylenol  at its best pain down to a 3/10  PRECAUTIONS: Other: MD order is for PROM only  RED FLAGS: None   WEIGHT BEARING RESTRICTIONS: No  FALLS:  Has patient fallen in last 6 months? Yes. Number of falls 1  LIVING ENVIRONMENT: Lives with: lives with their family Lives in: House/apartment Stairs: stairs at work not at home Has following equipment at home: None  OCCUPATION: Housekeeping, push/pull, lift and reach  PLOF: Independent and does housework   PATIENT GOALS:get back to work, have good motion  NEXT MD VISIT:   OBJECTIVE:  Note: Objective measures were completed at Evaluation unless otherwise noted.  DIAGNOSTIC FINDINGS:  IMPRESSION: 1. Acute comminuted fracture of the surgical neck of the humerus with mild displacement and impaction. 2. Acute comminuted nondisplaced fracture of the greater tuberosity of the humeral head. 3. Lipohemarthrosis.  PATIENT SURVEYS:  Quick Dash:  QUICK DASH  Please rate your ability do the following activities in the last week by selecting the  number below the appropriate response.   Activities Rating  Open a tight or new jar.  4 = Severe difficulty  Do heavy household chores (e.g., wash walls, floors). 5 = Unable  Carry a shopping bag or briefcase 5 = Unable  Wash your back. 5 = Unable  Use a knife to cut food. 3 = Moderate difficulty  Recreational activities in which you take some force or impact through your arm, shoulder or hand (e.g., golf, hammering, tennis, etc.). 5 = Unable  During the past week, to what extent has your arm, shoulder or hand problem interfered with your normal social activities with family, friends, neighbors or groups?  5 = Extremely  During the past week, were you limited in your work or other regular daily activities as a result of your arm, shoulder or hand problem? 5 = Unable  Rate the severity of the following symptoms in the last week: Arm, Shoulder, or hand pain. 4 = Severe  Rate the severity of the following symptoms in the last week: Tingling (pins and needles) in your arm, shoulder or hand. 2 = Mild  During the past week, how much difficulty have you had sleeping because of the pain in your arm, shoulder or hand?  4 = Severe difficulty   (A QuickDASH score may not be calculated if there is greater than 1 missing item.)  Quick Dash Disability/Symptom Score: [(sum of 42 (n) responses/55 (n)] x 25 = 84%  Minimally Clinically Important Difference (MCID): 15-20 points  (Franchignoni, F. et al. (2013). Minimally clinically important difference of the disabilities of the arm, shoulder, and hand outcome measures (DASH) and its shortened version (Quick DASH). Journal of Orthopaedic & Sports Physical Therapy, 44(1), 30-39)   COGNITION: Overall cognitive status: Within functional limits for tasks assessed     SENSATION: WFL  POSTURE: Fwd head, rounded shoulders  UPPER EXTREMITY ROM:   Passive ROM Right eval Left PROM eval  Shoulder flexion  92  Shoulder extension    Shoulder abduction  72   Shoulder adduction    Shoulder internal rotation  25  Shoulder external rotation  30  Elbow flexion    Elbow extension    Wrist flexion    Wrist extension    Wrist ulnar deviation    Wrist radial deviation  Wrist pronation    Wrist supination    (Blank rows = not tested)  UPPER EXTREMITY MMT:  Not tested due to fracture and MD order for PROM only  SHOULDER SPECIAL TESTS:  no special testing due to fracture JOINT MOBILITY TESTING:  Not tested due to fracture  PALPATION:  Very tight with significant tenderness in the right upper trap, tender in the right upper arm down to the elbow                                                                                                                             TREATMENT DATE:  10/22/23 Scapular retractions  Shrugs w/ reverse rolls  RUE PROM with end range holds  10/07/23 Evaluation, issued HEP   PATIENT EDUCATION: Education details: HEP/POC and the limitation of the MD order for PROM Person educated: Patient Education method: Explanation, Demonstration, Tactile cues, Verbal cues, and Handouts Education comprehension: verbalized understanding  HOME EXERCISE PROGRAM: Access Code: [email or web address] URL: [email or web address] Date: 10/07/2023 Prepared by: Ozell Mainland  Exercises - Circular Shoulder Pendulum with Table Support  - 2 x daily - 7 x weekly - 1 sets - 10 reps - 10 hold - Seated Scapular Retraction  - 2 x daily - 7 x weekly - 1 sets - 10 reps - 3 hold - Seated Shoulder Flexion Towel Slide at Table Top  - 2 x daily - 7 x weekly - 1 sets - 10 reps - 10 hold - Seated Elbow Flexion Shoulder Internal Rotation AAROM at Table with Towel  - 2 x daily - 7 x weekly - 1 sets - 10 reps - 3 hold - Seated Shoulder Shrugs  - 2 x daily - 7 x weekly - 1 sets - 10 reps - 3 hold  ASSESSMENT:  CLINICAL IMPRESSION: Patient is a 53 y.o. female who was seen today for physical therapy treatment for left proximal humerus  fracture, has had some issues with healing. The MD order for us  is PROM only.  She enters not wearing the sling reports she left it at home.  Initial guarding with PROM but able to relax as it progressed.  No pain with shrugs and scapular retractions.   OBJECTIVE IMPAIRMENTS: decreased activity tolerance, decreased ROM, decreased strength, increased edema, increased fascial restrictions, increased muscle spasms, impaired flexibility, impaired UE functional use, improper body mechanics, postural dysfunction, and pain.   ACTIVITY LIMITATIONS: carrying, lifting, sleeping, bathing, dressing, and reach over head  PARTICIPATION LIMITATIONS: meal prep, cleaning, laundry, driving, community activity, occupation, and yard work  PERSONAL FACTORS: Fitness and 1-2 comorbidities: DM, HTN are also affecting patient's functional outcome.   REHAB POTENTIAL: Good  CLINICAL DECISION MAKING: Evolving/moderate complexity  EVALUATION COMPLEXITY: Low   GOALS: Goals reviewed with patient? Yes  SHORT TERM GOALS: Target date: 11/07/23  Independent with initial HEP Baseline: Goal status: Met 10/22/23 LONG TERM GOALS: Target date: 01/07/24  Independent with advanced HEP Baseline:  Goal status: INITIAL  2.  Report pain decreased with ADL's by 50% Baseline:  Goal status: INITIAL  3.  Be able to do hair and dress without assist Baseline:  Goal status: INITIAL  4.  Increase left shoulder AROM to 150 degrees Baseline:  Goal status: INITIAL  5.  Be able to lift 5# overhead with the left arm for work Baseline:  Goal status: INITIAL  PLAN:  PT FREQUENCY: 1x/week  PT DURATION: 12 weeks  PLANNED INTERVENTIONS: 97164- PT Re-evaluation, 97110-Therapeutic exercises, 97530- Therapeutic activity, [id number]- Neuromuscular re-education, 97535- Self Care, [id number]- Manual therapy, [id number]- Electrical stimulation (unattended), 97016- Vasopneumatic device, Patient/Family education, Taping, Cryotherapy, and Moist  heat  PLAN FOR NEXT SESSION: Patient is limited by MD order for PROM only, she will try HEP on her own next week and then return for us .  We would like to see her shortly before the 18th as she sees the MD on the 18th that way we could write a note for further directions regarding the orders.   Tanda KANDICE Sorrow, PTA 10/22/2023, 10:16 AM

## 2023-10-27 ENCOUNTER — Telehealth (INDEPENDENT_AMBULATORY_CARE_PROVIDER_SITE_OTHER): Payer: Self-pay | Admitting: Primary Care

## 2023-10-27 NOTE — Telephone Encounter (Signed)
 Called pt to confirm appt. Pt did not answer and could not LVM because the mailbox was full.

## 2023-10-28 ENCOUNTER — Inpatient Hospital Stay (INDEPENDENT_AMBULATORY_CARE_PROVIDER_SITE_OTHER): Admitting: Primary Care

## 2023-10-29 ENCOUNTER — Ambulatory Visit: Admitting: Physical Therapy

## 2023-10-29 ENCOUNTER — Encounter: Payer: Self-pay | Admitting: Physical Therapy

## 2023-10-29 DIAGNOSIS — M25512 Pain in left shoulder: Secondary | ICD-10-CM

## 2023-10-29 DIAGNOSIS — R252 Cramp and spasm: Secondary | ICD-10-CM

## 2023-10-29 DIAGNOSIS — M25612 Stiffness of left shoulder, not elsewhere classified: Secondary | ICD-10-CM

## 2023-10-29 NOTE — Therapy (Signed)
 OUTPATIENT PHYSICAL THERAPY SHOULDER EVALUATION   Patient Name: Hannah Dougherty MRN: [id number] DOB:01/20/1971, 53 y.o., female Today's Date: 10/29/2023  END OF SESSION:  PT End of Session - 10/29/23 1012     Visit Number 3    Date for PT Re-Evaluation 01/07/24    PT Start Time 1015    PT Stop Time 1100    PT Time Calculation (min) 45 min    Activity Tolerance Patient tolerated treatment well    Behavior During Therapy WFL for tasks assessed/performed          Past Medical History:  Diagnosis Date   Diabetes mellitus without complication (HCC)    Hypertension    Past Surgical History:  Procedure Laterality Date   COLONOSCOPY N/A 12/02/2021   Procedure: COLONOSCOPY;  Surgeon: Burnette Fallow, MD;  Location: [facility];  Service: Gastroenterology;  Laterality: N/A;   HEMOSTASIS CLIP PLACEMENT  12/02/2021   Procedure: HEMOSTASIS CLIP PLACEMENT;  Surgeon: Burnette Fallow, MD;  Location: [facility];  Service: Gastroenterology;;   POLYPECTOMY  12/02/2021   Procedure: POLYPECTOMY;  Surgeon: Burnette Fallow, MD;  Location: [facility];  Service: Gastroenterology;;   SUBMUCOSAL TATTOO INJECTION  12/02/2021   Procedure: SUBMUCOSAL TATTOO INJECTION;  Surgeon: Burnette Fallow, MD;  Location: [facility];  Service: Gastroenterology;;   TUBAL LIGATION     VOCAL CORD LATERALIZATION, ENDOSCOPIC APPROACH W/ MLB     Patient Active Problem List   Diagnosis Date Noted   Renal artery stenosis (HCC) 12/01/2021   Hematochezia 11/30/2021   Insurance coverage problems 11/30/2021   Type 2 diabetes mellitus with hyperglycemia (HCC) 06/08/2021   Class 2 obesity 06/08/2021   Acute gastroenteritis 06/08/2021   Hypertensive urgency 02/16/2020   Hyperlipidemia associated with type 2 diabetes mellitus (HCC) 02/16/2020   Right kidney mass 02/16/2020   Left ovarian cyst 02/16/2020   Trichomoniasis 02/16/2020   Hypertension 12/28/2018   Healthcare maintenance 12/14/2018   Hyponatremia 12/10/2018    DMII (diabetes mellitus, type 2) (HCC) 12/09/2018    PCP: EMERSON Bohr, NP  REFERRING PROVIDER: Irving, MD  REFERRING DIAG: s/p left humeral fracture  THERAPY DIAG:  Acute pain of left shoulder  Stiffness of left shoulder, not elsewhere classified  Cramp and spasm  Rationale for Evaluation and Treatment: Rehabilitation  ONSET DATE: 08/14/23  SUBJECTIVE:                                                                                                                                                                                      SUBJECTIVE STATEMENT: Im ok, a little stressed out   Hand dominance: Right  PERTINENT HISTORY: See above  PAIN:  Are you having pain?  Yes: NPRS scale: 6/10 Pain location: left shoulder and left upper arm Pain description: ache Aggravating factors: reaching moving pain up to 10/10 Relieving factors: being in the sling, not moving, Tylenol  at its best pain down to a 3/10  PRECAUTIONS: Other: MD order is for PROM only  RED FLAGS: None   WEIGHT BEARING RESTRICTIONS: No  FALLS:  Has patient fallen in last 6 months? Yes. Number of falls 1  LIVING ENVIRONMENT: Lives with: lives with their family Lives in: House/apartment Stairs: stairs at work not at home Has following equipment at home: None  OCCUPATION: Housekeeping, push/pull, lift and reach  PLOF: Independent and does housework   PATIENT GOALS:get back to work, have good motion  NEXT MD VISIT:   OBJECTIVE:  Note: Objective measures were completed at Evaluation unless otherwise noted.  DIAGNOSTIC FINDINGS:  IMPRESSION: 1. Acute comminuted fracture of the surgical neck of the humerus with mild displacement and impaction. 2. Acute comminuted nondisplaced fracture of the greater tuberosity of the humeral head. 3. Lipohemarthrosis.  PATIENT SURVEYS:  Quick Dash:  QUICK DASH  Please rate your ability do the following activities in the last week by selecting the number  below the appropriate response.   Activities Rating  Open a tight or new jar.  4 = Severe difficulty  Do heavy household chores (e.g., wash walls, floors). 5 = Unable  Carry a shopping bag or briefcase 5 = Unable  Wash your back. 5 = Unable  Use a knife to cut food. 3 = Moderate difficulty  Recreational activities in which you take some force or impact through your arm, shoulder or hand (e.g., golf, hammering, tennis, etc.). 5 = Unable  During the past week, to what extent has your arm, shoulder or hand problem interfered with your normal social activities with family, friends, neighbors or groups?  5 = Extremely  During the past week, were you limited in your work or other regular daily activities as a result of your arm, shoulder or hand problem? 5 = Unable  Rate the severity of the following symptoms in the last week: Arm, Shoulder, or hand pain. 4 = Severe  Rate the severity of the following symptoms in the last week: Tingling (pins and needles) in your arm, shoulder or hand. 2 = Mild  During the past week, how much difficulty have you had sleeping because of the pain in your arm, shoulder or hand?  4 = Severe difficulty   (A QuickDASH score may not be calculated if there is greater than 1 missing item.)  Quick Dash Disability/Symptom Score: [(sum of 42 (n) responses/55 (n)] x 25 = 84%  Minimally Clinically Important Difference (MCID): 15-20 points  (Franchignoni, F. et al. (2013). Minimally clinically important difference of the disabilities of the arm, shoulder, and hand outcome measures (DASH) and its shortened version (Quick DASH). Journal of Orthopaedic & Sports Physical Therapy, 44(1), 30-39)   COGNITION: Overall cognitive status: Within functional limits for tasks assessed     SENSATION: WFL  POSTURE: Fwd head, rounded shoulders  UPPER EXTREMITY ROM:   Passive ROM Right eval Left PROM eval  Shoulder flexion  92  Shoulder extension    Shoulder abduction  72   Shoulder adduction    Shoulder internal rotation  25  Shoulder external rotation  30  Elbow flexion    Elbow extension    Wrist flexion    Wrist extension    Wrist ulnar deviation    Wrist radial deviation  Wrist pronation    Wrist supination    (Blank rows = not tested)  UPPER EXTREMITY MMT:  Not tested due to fracture and MD order for PROM only  SHOULDER SPECIAL TESTS:  no special testing due to fracture JOINT MOBILITY TESTING:  Not tested due to fracture  PALPATION:  Very tight with significant tenderness in the right upper trap, tender in the right upper arm down to the elbow                                                                                                                             TREATMENT DATE:  10/29/23 Scapular retractions 2x10  Shrugs 2x10 R wrist pronation & supination RUE PROM with end range holds  10/22/23 Scapular retractions  Shrugs w/ reverse rolls  RUE PROM with end range holds  10/07/23 Evaluation, issued HEP   PATIENT EDUCATION: Education details: HEP/POC and the limitation of the MD order for PROM Person educated: Patient Education method: Explanation, Demonstration, Tactile cues, Verbal cues, and Handouts Education comprehension: verbalized understanding  HOME EXERCISE PROGRAM: Access Code: [email or web address] URL: [email or web address] Date: 10/07/2023 Prepared by: Ozell Mainland  Exercises - Circular Shoulder Pendulum with Table Support  - 2 x daily - 7 x weekly - 1 sets - 10 reps - 10 hold - Seated Scapular Retraction  - 2 x daily - 7 x weekly - 1 sets - 10 reps - 3 hold - Seated Shoulder Flexion Towel Slide at Table Top  - 2 x daily - 7 x weekly - 1 sets - 10 reps - 10 hold - Seated Elbow Flexion Shoulder Internal Rotation AAROM at Table with Towel  - 2 x daily - 7 x weekly - 1 sets - 10 reps - 3 hold - Seated Shoulder Shrugs  - 2 x daily - 7 x weekly - 1 sets - 10 reps - 3 hold  ASSESSMENT:  CLINICAL  IMPRESSION: Patient is a 53 y.o. female who was seen today for physical therapy treatment for left proximal humerus fracture, has had some issues with healing. The MD order for us  is PROM only.  She enters wearing sing.   Cues need to relax with PROM due to guarding   No pain with shrugs and scapular retractions. Pt expressed that's her follow up MD appointment may be on the 20th instead of 18th.   OBJECTIVE IMPAIRMENTS: decreased activity tolerance, decreased ROM, decreased strength, increased edema, increased fascial restrictions, increased muscle spasms, impaired flexibility, impaired UE functional use, improper body mechanics, postural dysfunction, and pain.   ACTIVITY LIMITATIONS: carrying, lifting, sleeping, bathing, dressing, and reach over head  PARTICIPATION LIMITATIONS: meal prep, cleaning, laundry, driving, community activity, occupation, and yard work  PERSONAL FACTORS: Fitness and 1-2 comorbidities: DM, HTN are also affecting patient's functional outcome.   REHAB POTENTIAL: Good  CLINICAL DECISION MAKING: Evolving/moderate complexity  EVALUATION COMPLEXITY: Low   GOALS: Goals reviewed with patient? Yes  SHORT TERM GOALS: Target date: 11/07/23  Independent with initial HEP Baseline: Goal status: Met 10/22/23 LONG TERM GOALS: Target date: 01/07/24  Independent with advanced HEP Baseline:  Goal status: INITIAL  2.  Report pain decreased with ADL's by 50% Baseline:  Goal status: INITIAL  3.  Be able to do hair and dress without assist Baseline:  Goal status: Ongoing 10/29/23  4.  Increase left shoulder AROM to 150 degrees Baseline:  Goal status: ongoing 10/29/23  5.  Be able to lift 5# overhead with the left arm for work Baseline:  Goal status: INITIAL  PLAN:  PT FREQUENCY: 1x/week  PT DURATION: 12 weeks  PLANNED INTERVENTIONS: 97164- PT Re-evaluation, 97110-Therapeutic exercises, 97530- Therapeutic activity, [id number]- Neuromuscular re-education, 97535- Self  Care, 02859- Manual therapy, [id number]- Electrical stimulation (unattended), 97016- Vasopneumatic device, Patient/Family education, Taping, Cryotherapy, and Moist heat  PLAN FOR NEXT SESSION: Patient is limited by MD order for PROM only, she will try HEP on her own next week and then return for us .  We would like to see her shortly before the 18th as she sees the MD on the 18th that way we could write a note for further directions regarding the orders.   Tanda KANDICE Sorrow, PTA 10/29/2023, 10:12 AM

## 2023-11-03 ENCOUNTER — Encounter: Admitting: Physical Therapy

## 2023-11-08 ENCOUNTER — Encounter: Admitting: Physical Therapy

## 2023-11-19 ENCOUNTER — Encounter: Admitting: Physical Therapy

## 2023-11-26 ENCOUNTER — Encounter: Admitting: Physical Therapy

## 2024-02-07 ENCOUNTER — Ambulatory Visit: Admitting: Adult Health

## 2024-02-17 ENCOUNTER — Encounter: Admitting: Adult Health

## 2024-02-17 DIAGNOSIS — I10 Essential (primary) hypertension: Secondary | ICD-10-CM

## 2024-02-17 DIAGNOSIS — N1831 Chronic kidney disease, stage 3a: Secondary | ICD-10-CM

## 2024-02-17 DIAGNOSIS — E782 Mixed hyperlipidemia: Secondary | ICD-10-CM

## 2024-02-17 NOTE — Progress Notes (Signed)
 This encounter was created in error - please disregard.
